# Patient Record
Sex: Female | Born: 1970
Health system: Southern US, Community
[De-identification: ages and names within clinical notes are randomized; demographics above are authoritative.]

## PROBLEM LIST (undated history)

## (undated) DIAGNOSIS — K219 Gastro-esophageal reflux disease without esophagitis: Secondary | ICD-10-CM

## (undated) DIAGNOSIS — E119 Type 2 diabetes mellitus without complications: Secondary | ICD-10-CM

## (undated) DIAGNOSIS — D649 Anemia, unspecified: Secondary | ICD-10-CM

## (undated) DIAGNOSIS — R569 Unspecified convulsions: Secondary | ICD-10-CM

## (undated) DIAGNOSIS — I10 Essential (primary) hypertension: Secondary | ICD-10-CM

## (undated) DIAGNOSIS — E7800 Pure hypercholesterolemia, unspecified: Secondary | ICD-10-CM

## (undated) DIAGNOSIS — R945 Abnormal results of liver function studies: Secondary | ICD-10-CM

## (undated) DIAGNOSIS — T7840XA Allergy, unspecified, initial encounter: Secondary | ICD-10-CM

## (undated) DIAGNOSIS — R7989 Other specified abnormal findings of blood chemistry: Secondary | ICD-10-CM

## (undated) DIAGNOSIS — F32A Depression, unspecified: Secondary | ICD-10-CM

## (undated) DIAGNOSIS — M3500 Sicca syndrome, unspecified: Secondary | ICD-10-CM

## (undated) DIAGNOSIS — F329 Major depressive disorder, single episode, unspecified: Secondary | ICD-10-CM

## (undated) DIAGNOSIS — M797 Fibromyalgia: Secondary | ICD-10-CM

## (undated) DIAGNOSIS — F419 Anxiety disorder, unspecified: Secondary | ICD-10-CM

## (undated) HISTORY — PX: COLONOSCOPY: SHX174

## (undated) HISTORY — DX: Other specified abnormal findings of blood chemistry: R79.89

## (undated) HISTORY — DX: Hypomagnesemia: E83.42

## (undated) HISTORY — PX: OTHER SURGICAL HISTORY: SHX169

## (undated) HISTORY — DX: Abnormal results of liver function studies: R94.5

## (undated) HISTORY — PX: TONSILLECTOMY: SUR1361

## (undated) HISTORY — PX: COLON SURGERY: SHX602

## (undated) HISTORY — DX: Anxiety disorder, unspecified: F41.9

## (undated) HISTORY — DX: Allergy, unspecified, initial encounter: T78.40XA

## (undated) HISTORY — DX: Anemia, unspecified: D64.9

## (undated) HISTORY — PX: ESSURE TUBAL LIGATION: SUR464

## (undated) HISTORY — PX: WISDOM TOOTH EXTRACTION: SHX21

## (undated) HISTORY — PX: UPPER GASTROINTESTINAL ENDOSCOPY: SHX188

## (undated) HISTORY — DX: Type 2 diabetes mellitus without complications: E11.9

## (undated) HISTORY — DX: Morbid (severe) obesity due to excess calories: E66.01

## (undated) HISTORY — DX: Gastro-esophageal reflux disease without esophagitis: K21.9

## (undated) HISTORY — DX: Unspecified convulsions: R56.9

---

## 1998-01-07 ENCOUNTER — Ambulatory Visit (HOSPITAL_COMMUNITY): Admission: RE | Admit: 1998-01-07 | Discharge: 1998-01-07 | Payer: Self-pay | Admitting: Obstetrics and Gynecology

## 1998-03-08 ENCOUNTER — Other Ambulatory Visit: Admission: RE | Admit: 1998-03-08 | Discharge: 1998-03-08 | Payer: Self-pay | Admitting: Obstetrics and Gynecology

## 1998-03-22 ENCOUNTER — Other Ambulatory Visit: Admission: RE | Admit: 1998-03-22 | Discharge: 1998-03-22 | Payer: Self-pay | Admitting: Family Medicine

## 1998-03-29 ENCOUNTER — Other Ambulatory Visit: Admission: RE | Admit: 1998-03-29 | Discharge: 1998-03-29 | Payer: Self-pay | Admitting: Obstetrics and Gynecology

## 1998-04-01 ENCOUNTER — Other Ambulatory Visit: Admission: RE | Admit: 1998-04-01 | Discharge: 1998-04-01 | Payer: Self-pay | Admitting: Obstetrics and Gynecology

## 1998-04-11 ENCOUNTER — Other Ambulatory Visit: Admission: RE | Admit: 1998-04-11 | Discharge: 1998-04-11 | Payer: Self-pay | Admitting: Obstetrics and Gynecology

## 1998-05-02 ENCOUNTER — Other Ambulatory Visit: Admission: RE | Admit: 1998-05-02 | Discharge: 1998-05-02 | Payer: Self-pay | Admitting: Obstetrics and Gynecology

## 1998-05-15 ENCOUNTER — Ambulatory Visit (HOSPITAL_COMMUNITY): Admission: RE | Admit: 1998-05-15 | Discharge: 1998-05-15 | Payer: Self-pay | Admitting: Obstetrics and Gynecology

## 1998-07-31 ENCOUNTER — Ambulatory Visit (HOSPITAL_COMMUNITY): Admission: RE | Admit: 1998-07-31 | Discharge: 1998-07-31 | Payer: Self-pay | Admitting: Obstetrics and Gynecology

## 1998-10-16 ENCOUNTER — Inpatient Hospital Stay (HOSPITAL_COMMUNITY): Admission: AD | Admit: 1998-10-16 | Discharge: 1998-10-19 | Payer: Self-pay | Admitting: Obstetrics and Gynecology

## 1998-12-02 ENCOUNTER — Other Ambulatory Visit: Admission: RE | Admit: 1998-12-02 | Discharge: 1998-12-02 | Payer: Self-pay | Admitting: Obstetrics and Gynecology

## 1999-07-01 ENCOUNTER — Other Ambulatory Visit: Admission: RE | Admit: 1999-07-01 | Discharge: 1999-07-01 | Payer: Self-pay | Admitting: Obstetrics and Gynecology

## 2000-01-21 ENCOUNTER — Ambulatory Visit (HOSPITAL_COMMUNITY): Admission: RE | Admit: 2000-01-21 | Discharge: 2000-01-21 | Payer: Self-pay | Admitting: Obstetrics and Gynecology

## 2000-04-06 ENCOUNTER — Inpatient Hospital Stay (HOSPITAL_COMMUNITY): Admission: AD | Admit: 2000-04-06 | Discharge: 2000-04-08 | Payer: Self-pay | Admitting: Obstetrics and Gynecology

## 2000-05-17 ENCOUNTER — Other Ambulatory Visit: Admission: RE | Admit: 2000-05-17 | Discharge: 2000-05-17 | Payer: Self-pay | Admitting: Obstetrics and Gynecology

## 2001-06-24 ENCOUNTER — Other Ambulatory Visit: Admission: RE | Admit: 2001-06-24 | Discharge: 2001-06-24 | Payer: Self-pay | Admitting: Obstetrics and Gynecology

## 2002-07-17 ENCOUNTER — Other Ambulatory Visit: Admission: RE | Admit: 2002-07-17 | Discharge: 2002-07-17 | Payer: Self-pay | Admitting: Obstetrics and Gynecology

## 2003-08-06 ENCOUNTER — Other Ambulatory Visit: Admission: RE | Admit: 2003-08-06 | Discharge: 2003-08-06 | Payer: Self-pay | Admitting: Obstetrics and Gynecology

## 2004-08-27 ENCOUNTER — Other Ambulatory Visit: Admission: RE | Admit: 2004-08-27 | Discharge: 2004-08-27 | Payer: Self-pay | Admitting: Obstetrics and Gynecology

## 2005-08-20 ENCOUNTER — Other Ambulatory Visit: Admission: RE | Admit: 2005-08-20 | Discharge: 2005-08-20 | Payer: Self-pay | Admitting: Obstetrics and Gynecology

## 2005-11-09 ENCOUNTER — Ambulatory Visit: Payer: Self-pay | Admitting: Internal Medicine

## 2008-01-08 ENCOUNTER — Inpatient Hospital Stay (HOSPITAL_COMMUNITY): Admission: EM | Admit: 2008-01-08 | Discharge: 2008-01-10 | Payer: Self-pay | Admitting: *Deleted

## 2008-01-09 ENCOUNTER — Ambulatory Visit: Payer: Self-pay | Admitting: *Deleted

## 2008-12-22 ENCOUNTER — Emergency Department (HOSPITAL_BASED_OUTPATIENT_CLINIC_OR_DEPARTMENT_OTHER): Admission: EM | Admit: 2008-12-22 | Discharge: 2008-12-23 | Payer: Self-pay | Admitting: Emergency Medicine

## 2008-12-23 ENCOUNTER — Ambulatory Visit: Payer: Self-pay | Admitting: Diagnostic Radiology

## 2008-12-23 ENCOUNTER — Encounter (INDEPENDENT_AMBULATORY_CARE_PROVIDER_SITE_OTHER): Payer: Self-pay | Admitting: *Deleted

## 2008-12-25 ENCOUNTER — Encounter: Payer: Self-pay | Admitting: Internal Medicine

## 2009-06-21 ENCOUNTER — Telehealth: Payer: Self-pay | Admitting: Internal Medicine

## 2009-06-24 ENCOUNTER — Ambulatory Visit: Payer: Self-pay | Admitting: Internal Medicine

## 2009-06-24 DIAGNOSIS — R112 Nausea with vomiting, unspecified: Secondary | ICD-10-CM | POA: Insufficient documentation

## 2009-06-24 DIAGNOSIS — K219 Gastro-esophageal reflux disease without esophagitis: Secondary | ICD-10-CM | POA: Insufficient documentation

## 2009-06-24 LAB — CONVERTED CEMR LAB
AST: 20 units/L (ref 0–37)
Albumin: 3.7 g/dL (ref 3.5–5.2)
Amylase: 57 units/L (ref 27–131)
BUN: 11 mg/dL (ref 6–23)
Basophils Absolute: 0 10*3/uL (ref 0.0–0.1)
CO2: 29 meq/L (ref 19–32)
Calcium: 8.8 mg/dL (ref 8.4–10.5)
Creatinine, Ser: 0.5 mg/dL (ref 0.4–1.2)
Eosinophils Absolute: 0 10*3/uL (ref 0.0–0.7)
Glucose, Bld: 99 mg/dL (ref 70–99)
Hemoglobin: 13.3 g/dL (ref 12.0–15.0)
Lipase: 13 units/L (ref 0–75)
Lymphocytes Relative: 23 % (ref 12.0–46.0)
Lymphs Abs: 1.9 10*3/uL (ref 0.7–4.0)
Monocytes Relative: 5.7 % (ref 3.0–12.0)
Platelets: 240 10*3/uL (ref 150.0–400.0)
Potassium: 3.5 meq/L (ref 3.5–5.1)
TSH: 1.45 microintl units/mL (ref 0.35–5.50)
Total Bilirubin: 0.9 mg/dL (ref 0.3–1.2)
WBC: 8.4 10*3/uL (ref 4.5–10.5)

## 2009-06-26 ENCOUNTER — Telehealth: Payer: Self-pay | Admitting: Internal Medicine

## 2009-06-27 ENCOUNTER — Ambulatory Visit: Payer: Self-pay | Admitting: Internal Medicine

## 2009-06-28 LAB — CONVERTED CEMR LAB: UREASE: NEGATIVE

## 2009-07-01 ENCOUNTER — Telehealth: Payer: Self-pay | Admitting: Internal Medicine

## 2009-07-10 ENCOUNTER — Telehealth: Payer: Self-pay | Admitting: Internal Medicine

## 2009-07-10 ENCOUNTER — Encounter: Payer: Self-pay | Admitting: Internal Medicine

## 2009-07-11 ENCOUNTER — Encounter: Payer: Self-pay | Admitting: Internal Medicine

## 2009-07-11 ENCOUNTER — Telehealth (INDEPENDENT_AMBULATORY_CARE_PROVIDER_SITE_OTHER): Payer: Self-pay | Admitting: *Deleted

## 2009-07-17 ENCOUNTER — Encounter: Payer: Self-pay | Admitting: Internal Medicine

## 2009-07-18 ENCOUNTER — Ambulatory Visit (HOSPITAL_COMMUNITY): Admission: RE | Admit: 2009-07-18 | Discharge: 2009-07-18 | Payer: Self-pay | Admitting: Internal Medicine

## 2010-07-09 ENCOUNTER — Encounter: Payer: Self-pay | Admitting: Internal Medicine

## 2010-07-10 ENCOUNTER — Encounter: Payer: Self-pay | Admitting: Internal Medicine

## 2010-10-24 ENCOUNTER — Ambulatory Visit (HOSPITAL_COMMUNITY): Admission: RE | Admit: 2010-10-24 | Payer: Self-pay | Source: Home / Self Care | Admitting: Obstetrics and Gynecology

## 2010-11-23 ENCOUNTER — Encounter: Payer: Self-pay | Admitting: Obstetrics and Gynecology

## 2010-11-24 ENCOUNTER — Encounter: Payer: Self-pay | Admitting: Internal Medicine

## 2010-12-02 NOTE — Medication Information (Signed)
Summary: Pantoprazole DENIED/[place] [employer]  Pantoprazole DENIED/[place] [facility]   Imported By: Sherian Rein 08/04/2010 08:51:40  _____________________________________________________________________  External Attachment:    Type:   Image     Comment:   External Document

## 2010-12-02 NOTE — Medication Information (Signed)
Summary: Change in meds/[facility]  Change in meds/[facility]   Imported By: Lester [place] 07/15/2010 10:13:37  _____________________________________________________________________  External Attachment:    Type:   Image     Comment:   External Document

## 2010-12-02 NOTE — Miscellaneous (Signed)
Summary: Change Rx. to Nexium  Clinical Lists Changes  Medications: Removed medication of PANTOPRAZOLE SODIUM 40 MG TBEC (PANTOPRAZOLE SODIUM) Take 1 p.o. one half hour before breakfast and one half hr. before supper Added new medication of NEXIUM 40 MG CPDR (ESOMEPRAZOLE MAGNESIUM) 1 by mouth two times a day - Signed Rx of NEXIUM 40 MG CPDR (ESOMEPRAZOLE MAGNESIUM) 1 by mouth two times a day;  #60 x 11;  Signed;  Entered by: Milford Cage NCMA;  Authorized by: Hilarie Fredrickson MD;  Method used: Electronically to [facility] [address], [place], [place], [place]  [address], Ph: [phone] or [phone], Fax: [phone]    Prescriptions: NEXIUM 40 MG CPDR (ESOMEPRAZOLE MAGNESIUM) 1 by mouth two times a day  #60 x 11   Entered by:   Milford Cage NCMA   Authorized by:   Hilarie Fredrickson MD   Signed by:   Milford Cage NCMA on 07/09/2010   Method used:   Electronically to        [facility] [address] (retail)       [address]       [place], [place]  [address]       Ph: [phone] or [phone]       Fax: [phone]   RxID:   [id number]

## 2011-02-17 LAB — DIFFERENTIAL
Eosinophils Absolute: 0 10*3/uL (ref 0.0–0.7)
Lymphs Abs: 2.1 10*3/uL (ref 0.7–4.0)
Monocytes Relative: 3 % (ref 3–12)
Neutro Abs: 8.4 10*3/uL — ABNORMAL HIGH (ref 1.7–7.7)
Neutrophils Relative %: 77 % (ref 43–77)

## 2011-02-17 LAB — COMPREHENSIVE METABOLIC PANEL
ALT: 31 U/L (ref 0–35)
Calcium: 9.3 mg/dL (ref 8.4–10.5)
Glucose, Bld: 106 mg/dL — ABNORMAL HIGH (ref 70–99)
Sodium: 139 mEq/L (ref 135–145)
Total Protein: 8.2 g/dL (ref 6.0–8.3)

## 2011-02-17 LAB — URINALYSIS, ROUTINE W REFLEX MICROSCOPIC
Bilirubin Urine: NEGATIVE
Glucose, UA: NEGATIVE mg/dL
Hgb urine dipstick: NEGATIVE
Ketones, ur: 15 mg/dL — AB
Protein, ur: 100 mg/dL — AB
pH: 6 (ref 5.0–8.0)

## 2011-02-17 LAB — CBC
Hemoglobin: 14.1 g/dL (ref 12.0–15.0)
MCHC: 34.1 g/dL (ref 30.0–36.0)
RDW: 12.1 % (ref 11.5–15.5)

## 2011-03-20 NOTE — Discharge Summary (Signed)
NAMECATHERYN, [name] NO.:  [id number]   MEDICAL RECORD NO.:  [id number]          PATIENT TYPE:  IPS   LOCATION:  0307                          FACILITY:  [facility]   PHYSICIAN:  Jasmine Pang, M.D. DATE OF BIRTH:  Apr 21, 1971   DATE OF ADMISSION:  01/08/2008  DATE OF DISCHARGE:  01/10/2008                               DISCHARGE SUMMARY   IDENTIFICATION:  This is a 40 year old married female from [place],  [place] who was admitted on a voluntary basis on January 08, 2008.   HISTORY OF PRESENT ILLNESS:  The patient suffers from depression,  anxiety, and suicidal ideation.  She was referred by her outpatient  psychiatrist, Dr. Marijo File for inpatient stabilization.  She has a history  of depression and has been severely depressed for the past 6 weeks.  She  reports staying in bed, losing weight, isolating, having uncontrollable  crying, generalized anxiety, and irritability.  She reports suicidal  ideation, but does not want to act on thoughs because of her children.  She is unable to function daily activities.  She cannot identify a  particular stressor, but says her mother died 2 years ago and her 7-year-  old has mental health problems.   PAST PSYCHIATRIC HISTORY:  This is the first [facility] admission for the  patient.  She sees Dr. Franchot Erichsen on an outpatient basis in [facility]  Her therapist is Nicole Cella.   ALCOHOL AND DRUG HISTORY:  None.   MEDICAL PROBLEMS:  Polycystic ovaries and ovarian syndrome.   MEDICATIONS:  1. Lipitor 40 mg daily.  2. Glucophage 1000 mg daily.  3. Lexapro 20 mg daily.  4. Abilify 7.5 mg daily.  5. Klonopin 1 mg p.o. b.i.d.  6. Topamax 75 mg daily.   DRUG ALLERGIES:  Penicillin and sulfa drugs.   PHYSICAL FINDINGS:  There were no acute physical or medical problems  noted.   ADMISSION LABORATORIES:  CBC was within normal limits except for an  elevated glucose of 109 and a total bilirubin of 1.5.  AST was 52 and  ALT was  50.   HOSPITAL COURSE:  Upon admission, the patient was continued on her  Lipitor 40 mg daily, Glucophage 1000 mg daily, Topamax 75 mg daily,  Lexapro 20 mg daily, Abilify 7.5 mg daily, and clonazepam 1 mg p.o.  b.i.d., and the patient was also started on Ambien 10 mg p.o. q.h.s.  p.r.n., insomnia.  Initially in session with me, the patient was  tearful, but cooperative.  She was somewhat reserved, but was able to  participate appropriately in unit therapeutic groups and activities.  Therapeutic issues revolved around her feelings of increasing depression  over the past month.  Her Abilify was increased to 15 mg p.o. q. day and  Lexapro was increased to 30 mg q. day.  She was also restarted on  Concerta, which had been an outpatient medication, Concerta 36 mg p.o.  q. day.  The patient improved quickly.  Her sleep was good.  Appetite  was good.  On January 10, 2008, mood was less depressed  and anxious.  Affect consistent with mood.  There was no suicidal or homicidal  ideation.  No thoughts of self-injurious behavior.  No auditory or  visual hallucinations.  No paranoia or delusions.  Thoughts were logical  and goal-directed.  Thought content, no predominant theme.  Cognitive  exam was grossly back to baseline.  The patient felt ready for discharge  and stated she felt uncomfortable in the hospital.  She felt she could  be safe as an outpatient and I agreed.   DISCHARGE DIAGNOSES:  Axis I:  Major depressive disorder, recurrent,  severe without psychosis.  Axis II:  None.  Axis III:  Polycystic ovarian syndrome.  Axis IV:  Moderate (other psychosocial problems, burden of psychiatric  illness).  Axis V:  Global assessment of functioning was 50 upon discharge.  GAF  was 35 upon admission.  GAF highest past year was 65.   DISCHARGE PLANS:  There was no specific activity level or dietary  restrictions.   POSTHOSPITAL CARE PLANS:  The patient will see Dr. Franchot Erichsen, her  psychiatrist on  January 18, 2008, at 11 o'clock a.m.  She will see Dr.  Nicole Cella, her therapist on January 19, 2008, at 10:30 a.m.   DISCHARGE MEDICATIONS:  1. Lexapro 30 mg daily.  2. Abilify 15 mg daily.  3. Klonopin 1 mg p.o. b.i.d.  4. Topamax 75 mg daily.  5. Concerta was increased to 27 mg daily because she felt the 36 mg      was too high.  6. Lipitor 40 mg daily.  7. Glucophage 1000 mg daily.      Jasmine Pang, M.D.  Electronically Signed     [facility]/MEDQ  D:  01/28/2008  T:  01/28/2008  Job:  [id number]

## 2011-07-27 LAB — DRUGS OF ABUSE SCREEN W/O ALC, ROUTINE URINE
Amphetamine Screen, Ur: NEGATIVE
Benzodiazepines.: NEGATIVE
Creatinine,U: 247.6
Marijuana Metabolite: NEGATIVE
Propoxyphene: NEGATIVE

## 2011-07-27 LAB — URINALYSIS, ROUTINE W REFLEX MICROSCOPIC
Hgb urine dipstick: NEGATIVE
Leukocytes, UA: NEGATIVE
Nitrite: NEGATIVE
Specific Gravity, Urine: 1.022
Urobilinogen, UA: 1

## 2011-07-27 LAB — COMPREHENSIVE METABOLIC PANEL
AST: 52 — ABNORMAL HIGH
Albumin: 3.8
Calcium: 9.5
Creatinine, Ser: 0.83
GFR calc Af Amer: >60
GFR calc non Af Amer: >60

## 2011-07-27 LAB — CBC
MCHC: 33.6
MCV: 79.2
Platelets: 259

## 2011-07-27 LAB — URINE MICROSCOPIC-ADD ON

## 2011-12-05 ENCOUNTER — Encounter (HOSPITAL_BASED_OUTPATIENT_CLINIC_OR_DEPARTMENT_OTHER): Payer: Self-pay | Admitting: Emergency Medicine

## 2011-12-05 ENCOUNTER — Emergency Department (HOSPITAL_BASED_OUTPATIENT_CLINIC_OR_DEPARTMENT_OTHER)
Admission: EM | Admit: 2011-12-05 | Discharge: 2011-12-05 | Disposition: A | Discharge status: Home / Self Care | Payer: Medicare HMO | Attending: Emergency Medicine | Admitting: Emergency Medicine

## 2011-12-05 DIAGNOSIS — R112 Nausea with vomiting, unspecified: Secondary | ICD-10-CM | POA: Insufficient documentation

## 2011-12-05 DIAGNOSIS — A499 Bacterial infection, unspecified: Secondary | ICD-10-CM | POA: Insufficient documentation

## 2011-12-05 DIAGNOSIS — E7800 Pure hypercholesterolemia, unspecified: Secondary | ICD-10-CM | POA: Insufficient documentation

## 2011-12-05 DIAGNOSIS — N390 Urinary tract infection, site not specified: Secondary | ICD-10-CM

## 2011-12-05 DIAGNOSIS — Z79899 Other long term (current) drug therapy: Secondary | ICD-10-CM | POA: Insufficient documentation

## 2011-12-05 DIAGNOSIS — F3289 Other specified depressive episodes: Secondary | ICD-10-CM | POA: Insufficient documentation

## 2011-12-05 DIAGNOSIS — I10 Essential (primary) hypertension: Secondary | ICD-10-CM | POA: Insufficient documentation

## 2011-12-05 DIAGNOSIS — B9689 Other specified bacterial agents as the cause of diseases classified elsewhere: Secondary | ICD-10-CM | POA: Insufficient documentation

## 2011-12-05 DIAGNOSIS — N920 Excessive and frequent menstruation with regular cycle: Secondary | ICD-10-CM | POA: Insufficient documentation

## 2011-12-05 DIAGNOSIS — N760 Acute vaginitis: Secondary | ICD-10-CM | POA: Insufficient documentation

## 2011-12-05 DIAGNOSIS — F329 Major depressive disorder, single episode, unspecified: Secondary | ICD-10-CM | POA: Insufficient documentation

## 2011-12-05 HISTORY — DX: Pure hypercholesterolemia, unspecified: E78.00

## 2011-12-05 HISTORY — DX: Essential (primary) hypertension: I10

## 2011-12-05 HISTORY — DX: Major depressive disorder, single episode, unspecified: F32.9

## 2011-12-05 HISTORY — DX: Depression, unspecified: F32.A

## 2011-12-05 LAB — CBC
HCT: 43.9 % (ref 36.0–46.0)
MCH: 29.1 pg (ref 26.0–34.0)
MCV: 85.7 fL (ref 78.0–100.0)
Platelets: 219 10*3/uL (ref 150–400)
RDW: 13.2 % (ref 11.5–15.5)

## 2011-12-05 LAB — WET PREP, GENITAL
Trich, Wet Prep: NONE SEEN
Yeast Wet Prep HPF POC: NONE SEEN

## 2011-12-05 LAB — URINALYSIS, MICROSCOPIC ONLY
Ketones, ur: 15 mg/dL — AB
Nitrite: POSITIVE — AB
Protein, ur: 30 mg/dL — AB
Specific Gravity, Urine: 1.026 (ref 1.005–1.030)
Urobilinogen, UA: 1 mg/dL (ref 0.0–1.0)

## 2011-12-05 LAB — PREGNANCY, URINE: Preg Test, Ur: NEGATIVE

## 2011-12-05 MED ORDER — MORPHINE SULFATE 4 MG/ML IJ SOLN
4.0000 mg | Freq: Once | INTRAMUSCULAR | Status: AC
  Administered 2011-12-05: 4 mg via INTRAVENOUS
  Filled 2011-12-05: qty 1

## 2011-12-05 MED ORDER — ONDANSETRON 4 MG PO TBDP: 4.0000 mg | ORAL_TABLET | Freq: Three times a day (TID) | ORAL | Status: AC | PRN

## 2011-12-05 MED ORDER — KETOROLAC TROMETHAMINE 30 MG/ML IJ SOLN
30.0000 mg | Freq: Once | INTRAMUSCULAR | Status: AC
  Administered 2011-12-05: 30 mg via INTRAVENOUS
  Filled 2011-12-05: qty 1

## 2011-12-05 MED ORDER — NITROFURANTOIN MONOHYD MACRO 100 MG PO CAPS: 100.0000 mg | ORAL_CAPSULE | Freq: Two times a day (BID) | ORAL | Status: AC

## 2011-12-05 MED ORDER — HYDROCODONE-ACETAMINOPHEN 5-500 MG PO TABS: 1.0000 | ORAL_TABLET | Freq: Four times a day (QID) | ORAL | Status: AC | PRN

## 2011-12-05 MED ORDER — SODIUM CHLORIDE 0.9 % IV SOLN
Freq: Once | INTRAVENOUS | Status: AC
  Administered 2011-12-05: 999 mL via INTRAVENOUS

## 2011-12-05 MED ORDER — METRONIDAZOLE 500 MG PO TABS: 500.0000 mg | ORAL_TABLET | Freq: Two times a day (BID) | ORAL | Status: AC

## 2011-12-05 MED ORDER — ONDANSETRON HCL 4 MG/2ML IJ SOLN
4.0000 mg | Freq: Once | INTRAMUSCULAR | Status: AC
  Administered 2011-12-05: 4 mg via INTRAVENOUS

## 2011-12-05 MED ORDER — ONDANSETRON HCL 4 MG/2ML IJ SOLN
INTRAMUSCULAR | Status: AC
  Administered 2011-12-05: 4 mg via INTRAVENOUS
  Filled 2011-12-05: qty 2

## 2011-12-05 NOTE — ED Notes (Signed)
Pt started menstrual cycle yesterday.  This morning she started having Lower pelvis cramping and lower back pain this am with N/V.  Pt has been having irregular periods but last one a month ago.  No diarrhea.  Last BM 2 days ago.  No vaginal burning or itching.  No dysuria.

## 2011-12-05 NOTE — ED Provider Notes (Signed)
History     CSN: [id number]  Arrival date & time 12/05/11  1134   First MD Initiated Contact with Patient 12/05/11 1216      Chief Complaint  Patient presents with  . Vaginal Bleeding  . Nausea  . Emesis    (Consider location/radiation/quality/duration/timing/severity/associated sxs/prior treatment) HPI Comments: Pt states that she has a history of irregular periods and she had one last month and again this month:pt states that she doesn't normally get much cramping:pt states that she is using 1 pad per hour this morning  Patient is a 41 y.o. female presenting with vaginal bleeding. The history is provided by the patient. No language interpreter was used.  Vaginal Bleeding This is a new problem. The current episode started yesterday. The problem occurs constantly. The problem has been unchanged. Associated symptoms include abdominal pain, nausea and vomiting. Pertinent negatives include no fever or urinary symptoms. The symptoms are aggravated by nothing. She has tried nothing for the symptoms.    Past Medical History  Diagnosis Date  . Hypertension   . Depression   . Hypercholesteremia     History reviewed. No pertinent past surgical history.  History reviewed. No pertinent family history.  History  Substance Use Topics  . Smoking status: Not on file  . Smokeless tobacco: Not on file  . Alcohol Use:     OB History    Grav Para Term Preterm Abortions TAB SAB Ect Mult Living   2 2 2              Review of Systems  Constitutional: Negative for fever.  Gastrointestinal: Positive for nausea, vomiting and abdominal pain.  Genitourinary: Positive for vaginal bleeding.  All other systems reviewed and are negative.    Allergies  Penicillins and Sulfonamide derivatives  Home Medications   Current Outpatient Rx  Name Route Sig Dispense Refill  . ARIPIPRAZOLE 10 MG PO TABS Oral Take 10 mg by mouth daily.    Marland Kitchen ESCITALOPRAM OXALATE 10 MG PO TABS Oral Take 10 mg by  mouth daily.    . FENOFIBRATE 145 MG PO TABS Oral Take 145 mg by mouth daily.    Marland Kitchen LAMOTRIGINE 100 MG PO TABS Oral Take 100 mg by mouth daily.    Marland Kitchen LISINOPRIL 10 MG PO TABS Oral Take 10 mg by mouth daily.    Marland Kitchen ROSUVASTATIN CALCIUM 10 MG PO TABS Oral Take 10 mg by mouth daily.      BP 107/48  Pulse 75  Temp 97.5 F (36.4 C)  Resp 22  Ht 5\' 7"  (1.702 m)  SpO2 97%  LMP 12/04/2011  Physical Exam  Nursing note and vitals reviewed. Constitutional: She is oriented to person, place, and time. She appears well-developed and well-nourished.  HENT:  Head: Normocephalic and atraumatic.  Right Ear: External ear normal.  Left Ear: External ear normal.  Eyes: EOM are normal.  Neck: Neck supple.  Cardiovascular: Normal rate and regular rhythm.   Pulmonary/Chest: Effort normal and breath sounds normal.  Abdominal: Soft. Bowel sounds are normal. There is no tenderness.  Genitourinary: Cervix exhibits no motion tenderness. There is bleeding around the vagina.  Musculoskeletal: Normal range of motion.  Neurological: She is alert and oriented to person, place, and time.  Skin: Skin is warm and dry.  Psychiatric: She has a normal mood and affect.    ED Course  Procedures (including critical care time)  Labs Reviewed  URINALYSIS, WITH MICROSCOPIC - Abnormal; Notable for the following:  Color, Urine BROWN (*) BIOCHEMICALS MAY BE AFFECTED BY COLOR   APPearance CLOUDY (*)    Hgb urine dipstick LARGE (*)    Bilirubin Urine MODERATE (*)    Ketones, ur 15 (*)    Protein, ur 30 (*)    Nitrite POSITIVE (*)    Leukocytes, UA SMALL (*)    Bacteria, UA MANY (*)    All other components within normal limits  WET PREP, GENITAL - Abnormal; Notable for the following:    Clue Cells Wet Prep HPF POC FEW (*)    WBC, Wet Prep HPF POC FEW (*)    All other components within normal limits  CBC - Abnormal; Notable for the following:    WBC 17.2 (*)    RBC 5.12 (*)    All other components within normal  limits  PREGNANCY, URINE  GC/CHLAMYDIA PROBE AMP, GENITAL   No results found.   1. UTI (lower urinary tract infection)   2. BV (bacterial vaginosis)   3. Menorrhagia       MDM  Pt has a benign abdominal exam:pt feeling better after pain medication:pt is tolerating po here without any problem:pt hgb is normal at this time        Teressa Lower, NP 12/05/11 1530

## 2011-12-06 NOTE — ED Provider Notes (Signed)
Medical screening examination/treatment/procedure(s) were performed by non-physician practitioner and as supervising physician I was immediately available for consultation/collaboration.  Zvi Duplantis, MD 12/06/11 0754 

## 2011-12-07 LAB — GC/CHLAMYDIA PROBE AMP, GENITAL
Chlamydia, DNA Probe: NEGATIVE
GC Probe Amp, Genital: NEGATIVE

## 2012-02-19 ENCOUNTER — Other Ambulatory Visit (HOSPITAL_COMMUNITY): Payer: Self-pay | Admitting: Obstetrics and Gynecology

## 2012-02-19 DIAGNOSIS — N971 Female infertility of tubal origin: Secondary | ICD-10-CM

## 2012-03-16 ENCOUNTER — Ambulatory Visit (HOSPITAL_COMMUNITY): Payer: Medicare HMO

## 2013-01-29 ENCOUNTER — Encounter (HOSPITAL_BASED_OUTPATIENT_CLINIC_OR_DEPARTMENT_OTHER): Payer: Self-pay | Admitting: *Deleted

## 2013-01-29 ENCOUNTER — Emergency Department (HOSPITAL_BASED_OUTPATIENT_CLINIC_OR_DEPARTMENT_OTHER)
Admission: EM | Admit: 2013-01-29 | Discharge: 2013-01-29 | Disposition: A | Discharge status: Home / Self Care | Payer: Medicare HMO | Attending: Emergency Medicine | Admitting: Emergency Medicine

## 2013-01-29 ENCOUNTER — Emergency Department (HOSPITAL_BASED_OUTPATIENT_CLINIC_OR_DEPARTMENT_OTHER): Payer: Medicare HMO

## 2013-01-29 DIAGNOSIS — E669 Obesity, unspecified: Secondary | ICD-10-CM | POA: Insufficient documentation

## 2013-01-29 DIAGNOSIS — I10 Essential (primary) hypertension: Secondary | ICD-10-CM | POA: Insufficient documentation

## 2013-01-29 DIAGNOSIS — F329 Major depressive disorder, single episode, unspecified: Secondary | ICD-10-CM | POA: Insufficient documentation

## 2013-01-29 DIAGNOSIS — F3289 Other specified depressive episodes: Secondary | ICD-10-CM | POA: Insufficient documentation

## 2013-01-29 DIAGNOSIS — Z87891 Personal history of nicotine dependence: Secondary | ICD-10-CM | POA: Insufficient documentation

## 2013-01-29 DIAGNOSIS — Z79899 Other long term (current) drug therapy: Secondary | ICD-10-CM | POA: Insufficient documentation

## 2013-01-29 DIAGNOSIS — M3500 Sicca syndrome, unspecified: Secondary | ICD-10-CM

## 2013-01-29 DIAGNOSIS — Z8739 Personal history of other diseases of the musculoskeletal system and connective tissue: Secondary | ICD-10-CM

## 2013-01-29 DIAGNOSIS — R079 Chest pain, unspecified: Secondary | ICD-10-CM | POA: Insufficient documentation

## 2013-01-29 DIAGNOSIS — M940 Chondrocostal junction syndrome [Tietze]: Secondary | ICD-10-CM

## 2013-01-29 DIAGNOSIS — E7800 Pure hypercholesterolemia, unspecified: Secondary | ICD-10-CM | POA: Insufficient documentation

## 2013-01-29 HISTORY — DX: Sjogren syndrome, unspecified: M35.00

## 2013-01-29 HISTORY — DX: Fibromyalgia: M79.7

## 2013-01-29 LAB — CBC WITH DIFFERENTIAL/PLATELET
Lymphocytes Relative: 31 % (ref 12–46)
Lymphs Abs: 3.2 10*3/uL (ref 0.7–4.0)
Neutrophils Relative %: 58 % (ref 43–77)
Platelets: 246 10*3/uL (ref 150–400)
RBC: 4.68 MIL/uL (ref 3.87–5.11)
WBC: 10.2 10*3/uL (ref 4.0–10.5)

## 2013-01-29 LAB — BASIC METABOLIC PANEL
CO2: 27 mEq/L (ref 19–32)
GFR calc non Af Amer: >90 mL/min (ref >90)
Glucose, Bld: 113 mg/dL — ABNORMAL HIGH (ref 70–99)
Potassium: 3.8 mEq/L (ref 3.5–5.1)
Sodium: 137 mEq/L (ref 135–145)

## 2013-01-29 MED ORDER — PREDNISONE 20 MG PO TABS: 60.0000 mg | ORAL_TABLET | Freq: Every day | ORAL | Status: DC

## 2013-01-29 MED ORDER — HYDROCODONE-ACETAMINOPHEN 5-325 MG PO TABS
1.0000 | ORAL_TABLET | Freq: Once | ORAL | Status: AC
  Administered 2013-01-29: 1 via ORAL
  Filled 2013-01-29: qty 1

## 2013-01-29 NOTE — ED Notes (Signed)
Patient transported to X-ray 

## 2013-01-29 NOTE — ED Notes (Signed)
C/o pain in right upper chest x 4 days ago- "hurts when i take a deep breath"- has been taking ibuprofen, naprosyn and percocet without relief

## 2013-01-29 NOTE — ED Notes (Signed)
Pt reports right side chest pain with cough- "hurts when i breathe in"

## 2013-01-29 NOTE — ED Provider Notes (Signed)
History     CSN: [id number]  Arrival date & time 01/29/13  1337   First MD Initiated Contact with Patient 01/29/13 1347      Chief Complaint  Patient presents with  . Chest Pain    (Consider location/radiation/quality/duration/timing/severity/associated sxs/prior treatment) HPI Comments: Patient is a 42 y/o F with PMHx of Fibromyalgia, HTN, Hypercholesterolemia, Sjoegren Syndrome, depression presenting to the ED with right side chest pain x 4 days. Patient reported that pain is localized to the right side of the chest with radiation to right upper back, right posterior shoulder, and right mid-axillary region. Patient described pain as constant dull, aching sensation. Patient reported that pain is worse with deep inhalation, but improves she when lays down on her right side. Patient reported taking Naproxen and Percocets - which are patient's at home medication - with no relief. Patient denied right breast pain, abdominal pain, shortness of breathe, difficulty breathing, arm pain, nausea, vomiting, diarrhea, fever, chills, leg swelling, leg pain, urinary symptoms.     Patient is a 42 y.o. female presenting with chest pain. The history is provided by the patient. No language interpreter was used.  Chest Pain Pain location:  R chest Pain quality: aching and dull   Pain quality: not burning, no pressure, not sharp and not shooting   Pain radiates to:  R shoulder and upper back Pain radiates to the back: no   Pain severity:  Moderate Onset quality:  Gradual Duration:  4 days Timing:  Constant Progression:  Unchanged Chronicity:  Recurrent Context: breathing and at rest   Context: not eating and not lifting   Relieved by:  Rest (Laying on right side.) Worsened by:  Deep breathing (activity) Ineffective treatments: Percocet, naproxen. Associated symptoms: no abdominal pain, no altered mental status, no back pain, no cough, no dizziness, no dysphagia, no fever, no headache, no lower  extremity edema, no nausea, no numbness, no palpitations, no shortness of breath and not vomiting   Risk factors: obesity   Risk factors: no birth control and not pregnant     Past Medical History  Diagnosis Date  . Hypertension   . Depression   . Hypercholesteremia   . Sjoegren syndrome   . Fibromyalgia     Past Surgical History  Procedure Laterality Date  . Wisdom tooth extraction    . Essure tubal ligation    . Tonsillectomy      No family history on file.  History  Substance Use Topics  . Smoking status: Former Games developer  . Smokeless tobacco: Never Used  . Alcohol Use: No    OB History   Grav Para Term Preterm Abortions TAB SAB Ect Mult Living   2 2 2              Review of Systems  Constitutional: Negative for fever and chills.  HENT: Negative for ear pain, trouble swallowing and neck stiffness.   Eyes: Negative for pain.  Respiratory: Negative for cough, chest tightness and shortness of breath.   Cardiovascular: Positive for chest pain. Negative for palpitations.  Gastrointestinal: Negative for nausea, vomiting, abdominal pain, diarrhea and constipation.  Musculoskeletal: Negative for back pain.  Neurological: Negative for dizziness, numbness and headaches.  Psychiatric/Behavioral: Negative for altered mental status.  All other systems reviewed and are negative.    Allergies  Penicillins and Sulfonamide derivatives  Home Medications   Current Outpatient Rx  Name  Route  Sig  Dispense  Refill  . buPROPion (WELLBUTRIN XL) 300 MG 24 hr  tablet   Oral   Take 300 mg by mouth daily.         . clonazePAM (KLONOPIN) 1 MG tablet   Oral   Take 1 mg by mouth 3 (three) times daily as needed for anxiety.         Marland Kitchen escitalopram (LEXAPRO) 10 MG tablet   Oral   Take 10 mg by mouth daily.         . fenofibrate (TRICOR) 145 MG tablet   Oral   Take 145 mg by mouth daily.         Marland Kitchen ibuprofen (ADVIL,MOTRIN) 600 MG tablet   Oral   Take 600 mg by mouth  every 6 (six) hours as needed for pain.         Marland Kitchen lamoTRIgine (LAMICTAL) 100 MG tablet   Oral   Take 100 mg by mouth daily.         . naproxen (NAPROSYN) 500 MG tablet   Oral   Take 500 mg by mouth 2 (two) times daily with a meal.         . oxyCODONE-acetaminophen (PERCOCET/ROXICET) 5-325 MG per tablet   Oral   Take 1 tablet by mouth every 4 (four) hours as needed for pain.         . rosuvastatin (CRESTOR) 10 MG tablet   Oral   Take 10 mg by mouth daily.         . ARIPiprazole (ABILIFY) 10 MG tablet   Oral   Take 10 mg by mouth daily.         Marland Kitchen lisinopril (PRINIVIL,ZESTRIL) 10 MG tablet   Oral   Take 10 mg by mouth daily.         . predniSONE (DELTASONE) 20 MG tablet   Oral   Take 3 tablets (60 mg total) by mouth daily. For 4 days.   12 tablet   0     BP 139/63  Pulse 81  Temp(Src) 98.3 F (36.8 C) (Oral)  Resp 20  Ht 5\' 7"  (1.702 m)  Wt 250 lb (113.399 kg)  BMI 39.15 kg/m2  SpO2 98%  LMP 12/21/2012  Physical Exam  Nursing note and vitals reviewed. Constitutional: She is oriented to person, place, and time. She appears well-developed and well-nourished. No distress.  HENT:  Head: Normocephalic and atraumatic.  Eyes: Conjunctivae and EOM are normal. Pupils are equal, round, and reactive to light. Right eye exhibits no discharge. Left eye exhibits no discharge.  Neck: Normal range of motion. Neck supple. No tracheal deviation present.  Cardiovascular: Normal rate, regular rhythm, normal heart sounds and intact distal pulses.  Exam reveals no friction rub.   No murmur heard. Peripheral pulses palpable.  No pitting edema.  No leg swelling.  Pulmonary/Chest: Effort normal and breath sounds normal. No respiratory distress. She has no wheezes. She has no rales.  Abdominal: Soft. Bowel sounds are normal. She exhibits no distension. There is no tenderness. There is no rebound and no guarding.  Musculoskeletal: Normal range of motion. She exhibits  tenderness. She exhibits no edema.  Limited ROM to right shoulder due to pain in the right chest. Pain upon palpation to the right chest and upper right side of back. Sensation intact. Pulses palpable.   Lymphadenopathy:    She has no cervical adenopathy.  Neurological: She is alert and oriented to person, place, and time. No cranial nerve deficit. She exhibits normal muscle tone. Coordination normal.  Skin: Skin is warm and dry.  No rash noted. She is not diaphoretic. No erythema.  Psychiatric: She has a normal mood and affect. Her behavior is normal. Thought content normal.    ED Course  Procedures (including critical care time)  Labs Reviewed  BASIC METABOLIC PANEL - Abnormal; Notable for the following:    Glucose, Bld 113 (*)    All other components within normal limits  CBC WITH DIFFERENTIAL  TROPONIN I   Results for orders placed during the hospital encounter of 01/29/13  CBC WITH DIFFERENTIAL      Result Value Range   WBC 10.2  4.0 - 10.5 K/uL   RBC 4.68  3.87 - 5.11 MIL/uL   Hemoglobin 13.1  12.0 - 15.0 g/dL   HCT 16.1  09.6 - 04.5 %   MCV 84.6  78.0 - 100.0 fL   MCH 28.0  26.0 - 34.0 pg   MCHC 33.1  30.0 - 36.0 g/dL   RDW 40.9  81.1 - 91.4 %   Platelets 246  150 - 400 K/uL   Neutrophils Relative 58  43 - 77 %   Neutro Abs 6.0  1.7 - 7.7 K/uL   Lymphocytes Relative 31  12 - 46 %   Lymphs Abs 3.2  0.7 - 4.0 K/uL   Monocytes Relative 9  3 - 12 %   Monocytes Absolute 0.9  0.1 - 1.0 K/uL   Eosinophils Relative 2  0 - 5 %   Eosinophils Absolute 0.2  0.0 - 0.7 K/uL   Basophils Relative 0  0 - 1 %   Basophils Absolute 0.0  0.0 - 0.1 K/uL  BASIC METABOLIC PANEL      Result Value Range   Sodium 137  135 - 145 mEq/L   Potassium 3.8  3.5 - 5.1 mEq/L   Chloride 100  96 - 112 mEq/L   CO2 27  19 - 32 mEq/L   Glucose, Bld 113 (*) 70 - 99 mg/dL   BUN 15  6 - 23 mg/dL   Creatinine, Ser 7.82  0.50 - 1.10 mg/dL   Calcium 9.4  8.4 - 95.6 mg/dL   GFR calc non Af Amer >90  >90  mL/min   GFR calc Af Amer >90  >90 mL/min  TROPONIN I      Result Value Range   Troponin I <0.30  <0.30 ng/mL   Dg Chest 2 View  01/29/2013  *RADIOLOGY REPORT*  Clinical Data: Right chest pain.  Cough.  Pharyngitis.  CHEST - 2 VIEW  Comparison: None.  Findings: Heart size near the upper limit of normal.  Minimal linear density in the lingula.  Otherwise, clear lungs.  Mild to moderate dextroconvex thoracic scoliosis.  IMPRESSION: Minimal linear atelectasis or scarring in the lingula.  Otherwise, unremarkable examination.   Original Report Authenticated By: Beckie Salts, M.D.      Date: 01/29/2013  Rate: 79  Rhythm: normal sinus rhythm  QRS Axis: normal  Intervals: normal  ST/T Wave abnormalities: normal  Conduction Disutrbances:none  Narrative Interpretation:   Old EKG Reviewed: none available    1. Costochondral chest pain   2. Chest pain   3. History of fibromyalgia   4. History of Sjogren's disease       MDM  DDx: Acute coronary syndrome Costochondritis Fibromyalgia flare-up  I personally evaluated and examined patient. Norco given for pain - patient reported improvement in pain. CBC and BMP negative findings. Chest x-ray showed minimal atelectasis or scarring to lingula - patient was a former  smoker - no acute findings. EKG negative for STEMI/NSTEMI - troponins negative (<0.03). Case discussed with Dr. Blanchard Mane, Dr. Bebe Shaggy evaluated and examined patient. PERC negative. Patient afebrile, normotensive, non-tachycardic, alert, happy affect. Patient aseptic, not toxic appearing. No signs of infarction. No sign of pneumonia. No neurovascular damage noted. Suspected musculoskeletal condition - possible Fibromyalgia flare-up, etiology of pain unknown. Discharged patient. Discussed with patient to follow-up with Rheumatologist, Dr. Corliss Skains, and PCP regarding condition and re-evaluation. Discharged patient with prednisone x 4 days - discussed with patient how to take  medication. Discussed with patient to increase physical activity to help build strength and tone to the body. Discussed with patient that if symptoms are to worsen to please report back to the ED. Patient agreed to plan of care, understood, all questions answered.           Raymon Mutton, PA-C 01/29/13 1705

## 2013-01-31 NOTE — ED Provider Notes (Signed)
Medical screening examination/treatment/procedure(s) were conducted as a shared visit with non-physician practitioner(s) and myself.  I personally evaluated the patient during the encounter  Pt with reproducible CP on my exam EKG reviewed and no signs of ischemia Doubt PE on my assessment Stable in the ED   Joya Gaskins, MD 01/31/13 [phone]

## 2014-05-24 ENCOUNTER — Encounter: Payer: Self-pay | Admitting: Neurology

## 2014-05-28 ENCOUNTER — Telehealth: Payer: Self-pay | Admitting: Internal Medicine

## 2014-05-28 ENCOUNTER — Ambulatory Visit: Payer: Medicare HMO | Admitting: Neurology

## 2014-05-28 ENCOUNTER — Telehealth: Payer: Self-pay | Admitting: Neurology

## 2014-05-28 NOTE — Telephone Encounter (Signed)
Pt states her LFT's are elevated and she may need a liver biopsy. Pt scheduled to see Dr. Henrene Pastor 05/30/14@3 :15pm. Pt aware of appt and states she will bring her records with her to the visit from her PCP.

## 2014-05-28 NOTE — Telephone Encounter (Signed)
Pts PCP is Dr. Jefm Petty.

## 2014-05-28 NOTE — Telephone Encounter (Signed)
This patient did not show for a new patient appointment today. 

## 2014-05-30 ENCOUNTER — Ambulatory Visit (INDEPENDENT_AMBULATORY_CARE_PROVIDER_SITE_OTHER): Payer: Medicare HMO | Admitting: Internal Medicine

## 2014-05-30 ENCOUNTER — Encounter: Payer: Self-pay | Admitting: Internal Medicine

## 2014-05-30 ENCOUNTER — Ambulatory Visit (INDEPENDENT_AMBULATORY_CARE_PROVIDER_SITE_OTHER): Payer: Medicare HMO

## 2014-05-30 VITALS — BP 118/78 | HR 96 | Ht 65.5 in | Wt 250.2 lb

## 2014-05-30 DIAGNOSIS — R7989 Other specified abnormal findings of blood chemistry: Secondary | ICD-10-CM

## 2014-05-30 DIAGNOSIS — K760 Fatty (change of) liver, not elsewhere classified: Secondary | ICD-10-CM

## 2014-05-30 DIAGNOSIS — R945 Abnormal results of liver function studies: Principal | ICD-10-CM

## 2014-05-30 DIAGNOSIS — K7689 Other specified diseases of liver: Secondary | ICD-10-CM

## 2014-05-30 DIAGNOSIS — K219 Gastro-esophageal reflux disease without esophagitis: Secondary | ICD-10-CM

## 2014-05-30 LAB — CBC WITH DIFFERENTIAL/PLATELET
BASOS ABS: 0.1 10*3/uL (ref 0.0–0.1)
Basophils Relative: 0.7 % (ref 0.0–3.0)
EOS ABS: 0.2 10*3/uL (ref 0.0–0.7)
Eosinophils Relative: 1.4 % (ref 0.0–5.0)
HCT: 39.3 % (ref 36.0–46.0)
HEMOGLOBIN: 12.9 g/dL (ref 12.0–15.0)
LYMPHS ABS: 3.8 10*3/uL (ref 0.7–4.0)
LYMPHS PCT: 33 % (ref 12.0–46.0)
MCHC: 32.8 g/dL (ref 30.0–36.0)
MCV: 78.8 fl (ref 78.0–100.0)
Monocytes Absolute: 0.9 10*3/uL (ref 0.1–1.0)
Monocytes Relative: 7.5 % (ref 3.0–12.0)
NEUTROS ABS: 6.7 10*3/uL (ref 1.4–7.7)
Neutrophils Relative %: 57.4 % (ref 43.0–77.0)
PLATELETS: 304 10*3/uL (ref 150.0–400.0)
RBC: 4.98 Mil/uL (ref 3.87–5.11)
RDW: 14.5 % (ref 11.5–15.5)
WBC: 11.6 10*3/uL — ABNORMAL HIGH (ref 4.0–10.5)

## 2014-05-30 LAB — IBC PANEL
Iron: 47 ug/dL (ref 42–145)
Saturation Ratios: 11.1 % — ABNORMAL LOW (ref 20.0–50.0)
Transferrin: 301.6 mg/dL (ref 212.0–360.0)

## 2014-05-30 LAB — HEPATIC FUNCTION PANEL
ALBUMIN: 3.8 g/dL (ref 3.5–5.2)
ALT: 209 U/L — ABNORMAL HIGH (ref 0–35)
AST: 133 U/L — ABNORMAL HIGH (ref 0–37)
Alkaline Phosphatase: 78 U/L (ref 39–117)
BILIRUBIN DIRECT: 0.1 mg/dL (ref 0.0–0.3)
TOTAL PROTEIN: 7.7 g/dL (ref 6.0–8.3)
Total Bilirubin: 0.6 mg/dL (ref 0.2–1.2)

## 2014-05-30 LAB — IRON: IRON: 47 ug/dL (ref 42–145)

## 2014-05-30 LAB — PROTIME-INR
INR: 1.1 ratio — AB (ref 0.8–1.0)
PROTHROMBIN TIME: 12 s (ref 9.6–13.1)

## 2014-05-30 NOTE — Patient Instructions (Signed)
  Your physician has requested that you go to the basement for lab work before leaving today   You have been scheduled for an abdominal ultrasound at [facility] (1st floor of hospital) on 06/05/2014 at 9:00am. Please arrive 15 minutes prior to your appointment for registration. Make certain not to have anything to eat or drink 6 hours prior to your appointment. Should you need to reschedule your appointment, please contact radiology at [phone]. This test typically takes about 30 minutes to perform.  Please follow up with Dr. Henrene Pastor on 07/18/2014 at 10:45am.

## 2014-05-30 NOTE — Progress Notes (Signed)
HISTORY OF PRESENT ILLNESS:  [name] is a 43 y.o. female with hypertension, fibromyalgia, diabetes, and morbid obesity who is sent today by her PCP regarding elevated hepatic transaminases. Patient has had this for several years, though her enzyme levels have risen slightly over time. She was seen in this office August 2010 regarding nausea with vomiting and GERD. See that dictation. Subsequent upper endoscopy revealed reflux esophagitis. She was treated with PPI. Patient denies a personal or family history of liver disease. She has been told that she has fatty liver on imaging previously. Recent hepatitis serologies were negative. She denies IV drug use. One tattoo. She has chronic pain syndrome and has been on a number of agents for pain control. These have been held recent weeks because of liver test abnormalities. She has had progressive weight gain.. she is accompanied today by her husband. Outside office note and laboratories reviewed  REVIEW OF SYSTEMS:  All non-GI ROS negative except for joint aches  Past Medical History  Diagnosis Date  . Hypertension   . Depression   . Hypercholesteremia   . Sjoegren syndrome   . Fibromyalgia   . Diabetes   . Anxiety   . Hypomagnesemia   . Elevated LFTs   . Morbid obesity     Past Surgical History  Procedure Laterality Date  . Wisdom tooth extraction    . Essure tubal ligation    . Tonsillectomy      Social History [name]  reports that she has quit smoking. Her smoking use included Cigarettes. She smoked 0.00 packs per day. She has never used smokeless tobacco. She reports that she does not drink alcohol or use illicit drugs.  family history includes Diabetes in her father and mother; Lupus in her paternal aunt; Multiple sclerosis in her mother; Prostate cancer in her father; Psoriasis in her father; Rheum arthritis in her paternal aunt.  Allergies  Allergen Reactions  . Penicillins     REACTION: hives  . Sulfonamide  Derivatives     REACTION: hives       PHYSICAL EXAMINATION: Vital signs: BP 118/78  Pulse 96  Ht 5' 5.5" (1.664 m)  Wt 250 lb 4 oz (113.513 kg)  BMI 41.00 kg/m2  LMP 05/07/2014  Constitutional: Obese, generally well-appearing, no acute distress Psychiatric: alert and oriented x3, cooperative Eyes: extraocular movements intact, anicteric, conjunctiva pink Mouth: oral pharynx moist, no lesions Neck: supple no lymphadenopathy Cardiovascular: heart regular rate and rhythm, no murmur Lungs: clear to auscultation bilaterally Abdomen: soft, obese, nontender, nondistended, no obvious ascites, no peritoneal signs, normal bowel sounds, no organomegaly Rectal: Omitted Extremities: no lower extremity edema bilaterally Skin: no lesions on visible extremities Neuro: No focal deficits. No asterixis.    ASSESSMENT:  #1. Chronic elevation of hepatic transaminases. Most likely fatty liver disease #2. GERD. Currently on no medication #3. Chronic pain syndrome and other general medical problems  #4. Morbid obesity   PLAN:  #1. Repeat abdominal ultrasound #2. Expand laboratory profile to investigate for multiple causes for potential elevated hepatic transaminases. As well, prothrombin time #3. Followup office visit in 4-6 weeks to review the above

## 2014-05-31 LAB — MITOCHONDRIAL ANTIBODIES: MITOCHONDRIAL M2 AB, IGG: 0.31 (ref <0.91)

## 2014-05-31 LAB — CYCLOSPORINE LEVEL: Cyclosporine, Blood: <30 ng/mL — ABNORMAL LOW (ref 140–330)

## 2014-05-31 LAB — ANA: Anti Nuclear Antibody(ANA): NEGATIVE

## 2014-05-31 LAB — ANTI-SMOOTH MUSCLE ANTIBODY, IGG: SMOOTH MUSCLE AB: 14 U (ref <20)

## 2014-05-31 LAB — TSH: TSH: 2.77 u[IU]/mL (ref 0.35–4.50)

## 2014-05-31 LAB — FERRITIN: Ferritin: 43.4 ng/mL (ref 10.0–291.0)

## 2014-06-01 LAB — ALPHA-1-ANTITRYPSIN: A-1 Antitrypsin, Ser: 148 mg/dL (ref 83–199)

## 2014-06-05 ENCOUNTER — Ambulatory Visit (HOSPITAL_COMMUNITY)
Admission: RE | Admit: 2014-06-05 | Discharge: 2014-06-05 | Disposition: A | Discharge status: Home / Self Care | Payer: Medicare HMO | Source: Ambulatory Visit | Attending: Internal Medicine | Admitting: Internal Medicine

## 2014-06-05 DIAGNOSIS — R7989 Other specified abnormal findings of blood chemistry: Secondary | ICD-10-CM | POA: Diagnosis present

## 2014-06-05 DIAGNOSIS — K7689 Other specified diseases of liver: Secondary | ICD-10-CM | POA: Insufficient documentation

## 2014-06-05 DIAGNOSIS — R945 Abnormal results of liver function studies: Secondary | ICD-10-CM

## 2014-06-08 ENCOUNTER — Encounter: Payer: Self-pay | Admitting: Internal Medicine

## 2014-06-08 ENCOUNTER — Telehealth: Payer: Self-pay

## 2014-06-08 NOTE — Telephone Encounter (Signed)
Message copied by Audrea Muscat on Fri Jun 08, 2014  9:46 AM ------      Message from: HUNT, [place] R      Created: Mon Jun 04, 2014 10:57 AM      Regarding: FW: Cyclosporin level       Magda Paganini,            Please send credit slip for this lol.            Vaughan Basta      ----- Message -----         From: Irene Shipper, MD         Sent: 06/04/2014  10:43 AM           To: Maury Dus, RN      Subject: RE: Cyclosporin level                                    Ordered in error.      ----- Message -----         From: Maury Dus, RN         Sent: 06/04/2014  10:10 AM           To: Irene Shipper, MD      Subject: Cyclosporin level                                        Dr. Henrene Pastor,            I checked with Magda Paganini regarding this lab and she has a copy of the lab sheet with the lab highlighted. If this was ordered in error we can send down a credit slip to the lab. Just wanted to double check with you since [facility] had the slip.            Thanks,      Vaughan Basta             ------

## 2014-06-08 NOTE — Telephone Encounter (Signed)
Faxed credit form for Cyclosporin lab ordered in error to the lab.

## 2014-06-19 ENCOUNTER — Telehealth: Payer: Self-pay

## 2014-06-19 NOTE — Telephone Encounter (Signed)
Lab form was filled out and faxed to have this lab and subsequent charge credited back to patient.

## 2014-06-19 NOTE — Telephone Encounter (Signed)
Message copied by Audrea Muscat on Tue Jun 19, 2014  2:06 PM ------      Message from: Irene Shipper      Created: Thu May 31, 2014 11:44 AM       A cyclosporin level some how was drawn, but should not have been. Please make sure that the patient is not charged for this error. Thanks       ----- Message -----         From: [facility]         Sent: 05/31/2014   8:04 AM           To: Irene Shipper, MD                   ------

## 2014-07-10 ENCOUNTER — Other Ambulatory Visit: Payer: Self-pay | Admitting: Obstetrics and Gynecology

## 2014-07-10 DIAGNOSIS — Z803 Family history of malignant neoplasm of breast: Secondary | ICD-10-CM

## 2014-07-18 ENCOUNTER — Ambulatory Visit: Payer: Medicare HMO | Admitting: Internal Medicine

## 2014-07-26 ENCOUNTER — Other Ambulatory Visit: Payer: Medicare HMO

## 2014-08-24 DIAGNOSIS — M3500 Sicca syndrome, unspecified: Secondary | ICD-10-CM | POA: Insufficient documentation

## 2014-08-24 DIAGNOSIS — F329 Major depressive disorder, single episode, unspecified: Secondary | ICD-10-CM | POA: Diagnosis present

## 2014-09-03 ENCOUNTER — Encounter: Payer: Self-pay | Admitting: Internal Medicine

## 2015-01-21 ENCOUNTER — Other Ambulatory Visit: Payer: Self-pay | Admitting: *Deleted

## 2015-01-21 ENCOUNTER — Ambulatory Visit (INDEPENDENT_AMBULATORY_CARE_PROVIDER_SITE_OTHER): Payer: Medicare HMO | Admitting: Nurse Practitioner

## 2015-01-21 ENCOUNTER — Encounter: Payer: Self-pay | Admitting: Nurse Practitioner

## 2015-01-21 ENCOUNTER — Other Ambulatory Visit: Payer: Self-pay

## 2015-01-21 VITALS — BP 122/68 | HR 60 | Ht 67.0 in | Wt 250.0 lb

## 2015-01-21 DIAGNOSIS — D509 Iron deficiency anemia, unspecified: Secondary | ICD-10-CM | POA: Insufficient documentation

## 2015-01-21 DIAGNOSIS — R197 Diarrhea, unspecified: Secondary | ICD-10-CM | POA: Diagnosis not present

## 2015-01-21 MED ORDER — MOVIPREP 100 G PO SOLR: 1.0000 | Freq: Once | ORAL | Status: DC

## 2015-01-21 NOTE — Progress Notes (Signed)
HPI :   Patient is a 44 year old female, known remotely to Dr. Henrene Pastor. She had an upper endoscopy August 2010 for evaluation of nausea and vomiting. Only findings included mild esophagitis and erythema in distal bulb of duodenum. Patient is referred by PCP for evaluation of diarrhea.  Patient gives a nearly one-year history of loose, nonbloody stool. She has several episodes every morning upon rising, even prior to breakfast . No associated abdominal pain, urgency or vomiting.  Diarrhea usually subsides late morning, eating doesn't seem to have any effect on the diarrhea.  Metformin was stopped back in September but diarrhea did not improve. No new medications other than plaquenil started in October. No dietary changes, no use of artificial sweeteners. Her weight is stable.  Patient's maternal grandfather had ulcerative colitis.  Recent labs from PCP drawn 01/15/15: BUN 15, creatinine 0.6,  AST 27,  ALT 28,  total bili 0.6,  alkaline phosphatase 59,  albumin 3.7, WBC 10.3 hemoglobin 11.4,  platelet count 294,  MCV 78  Past Medical History  Diagnosis Date  . Hypertension   . Depression   . Hypercholesteremia   . Sjoegren syndrome   . Fibromyalgia   . Diabetes   . Anxiety   . Hypomagnesemia   . Elevated LFTs   . Morbid obesity     Family History  Problem Relation Age of Onset  . Multiple sclerosis Mother   . Psoriasis Father   . Prostate cancer Father   . Diabetes Mother     Juvenile  . Diabetes Father   . Lupus Paternal Aunt   . Rheum arthritis Paternal Aunt    History  Substance Use Topics  . Smoking status: Former Smoker    Types: Cigarettes  . Smokeless tobacco: Never Used  . Alcohol Use: No   Current Outpatient Prescriptions  Medication Sig Dispense Refill  . ALPRAZolam (XANAX) 1 MG tablet Take 1 mg by mouth 2 (two) times daily as needed for anxiety.    Marland Kitchen buPROPion (WELLBUTRIN XL) 300 MG 24 hr tablet Take 300 mg by mouth daily.    Marland Kitchen escitalopram (LEXAPRO) 10 MG  tablet Take 40 mg by mouth daily.     . hydroxychloroquine (PLAQUENIL) 200 MG tablet Take 400 mg by mouth daily.    . naproxen (NAPROSYN) 500 MG tablet Take 500 mg by mouth 2 (two) times daily as needed.     . pilocarpine (SALAGEN) 7.5 MG tablet Take 7.5 mg by mouth 3 (three) times daily.     . TURMERIC PO Take 1 tablet by mouth daily.     No current facility-administered medications for this visit.   Allergies  Allergen Reactions  . Penicillins     REACTION: hives  . Sulfonamide Derivatives     REACTION: hives     Review of Systems: All systems reviewed and negative except where noted in HPI.   Physical Exam: BP 122/68 mmHg  Pulse 60  Ht 5\' 7"  (1.702 m)  Wt 250 lb (113.399 kg)  BMI 39.15 kg/m2  LMP 12/22/2014 Constitutional: Pleasant, obese white female in no acute distress. HEENT: Normocephalic and atraumatic. Conjunctivae are normal. No scleral icterus. Neck supple.  Cardiovascular: Normal rate, regular rhythm.  Pulmonary/chest: Effort normal and breath sounds normal. No wheezing, rales or rhonchi. Abdominal: Soft, nondistended, diffusely tender. Bowel sounds active throughout. There are no masses palpable. No hepatomegaly. Extremities: no edema Lymphadenopathy: No cervical adenopathy noted. Neurological: Alert and oriented to person place and time. Skin: Skin is  warm and dry. No rashes noted. Psychiatric: Normal mood and affect. Behavior is normal.   ASSESSMENT AND PLAN:  46. 44 year old female with nearly one-year history of loose nonbloody stool every morning upon rising. Celiac testing negative (TTG IgA, TTG IgG, Endomysial antibody, Gliadin IgA and IgG). Will scan into EPIC.  Loose stool does not sound infectious, especially given chronicity. Rule out microscopic colitis, IBD. We discussed empirical course of antibiotics versus proceeding with colonoscopy. Patient is interested in proceeding with colonoscopy . The risks, benefits, and alternatives to colonoscopy with  possible biopsy and possible polypectomy were discussed with the patient and she consents to proceed.   2. Mild microcytic anemia. Most recent hemoglobin 11.4, MCV 78. Patient has heavy menstrual cycles.  3. History of elevated LFTs ( in EPIC). In July 2015 AST 133, AST 209. Autoimmune / genetic markers were  negative. Most recent transaminases drawn this month were normal.  4.  Sjogren's syndrome, on Plaquenil. Followed at [facility].   5. Fibromyalgia.   CC. Jefm Petty, MD

## 2015-01-21 NOTE — Patient Instructions (Signed)
You are scheduled for a colonoscopy with Dr Henrene Pastor on 01/31/15.

## 2015-01-22 ENCOUNTER — Other Ambulatory Visit: Payer: Self-pay

## 2015-01-22 DIAGNOSIS — R197 Diarrhea, unspecified: Secondary | ICD-10-CM

## 2015-01-22 NOTE — Progress Notes (Signed)
Agree with initial assessment and plans as outlined 

## 2015-01-24 ENCOUNTER — Telehealth: Payer: Self-pay | Admitting: Internal Medicine

## 2015-01-24 NOTE — Telephone Encounter (Signed)
Called number 3 pm no answer. [place]. Marie PV Called again 325 pm no answer. LMTRC marie PV

## 2015-01-24 NOTE — Telephone Encounter (Signed)
Pt states she has a weak stomach and she was wondering what could she do to help her not vomit Pt states she has been asleep and that's why she didn't answer She states that she got a sample of prep today from the office but nothing is documented of this  Instructed her she can use hard candy, chicken broth, chase with coke or juice, she can flavor with something . Instructed to drink  at her pace and take a little longer if needed Instructed to call with further questions Marijean Niemann

## 2015-01-30 ENCOUNTER — Other Ambulatory Visit: Payer: Self-pay | Admitting: *Deleted

## 2015-01-31 ENCOUNTER — Other Ambulatory Visit: Payer: Self-pay | Admitting: Internal Medicine

## 2015-01-31 ENCOUNTER — Ambulatory Visit (AMBULATORY_SURGERY_CENTER): Payer: Medicare HMO | Admitting: Internal Medicine

## 2015-01-31 ENCOUNTER — Encounter: Payer: Self-pay | Admitting: Internal Medicine

## 2015-01-31 VITALS — BP 133/75 | HR 65 | Temp 97.6°F | Resp 16 | Ht 67.0 in | Wt 250.0 lb

## 2015-01-31 DIAGNOSIS — D122 Benign neoplasm of ascending colon: Secondary | ICD-10-CM

## 2015-01-31 DIAGNOSIS — R197 Diarrhea, unspecified: Secondary | ICD-10-CM | POA: Diagnosis not present

## 2015-01-31 MED ORDER — SODIUM CHLORIDE 0.9 % IV SOLN: 500.0000 mL | INTRAVENOUS | Status: DC

## 2015-01-31 NOTE — Progress Notes (Signed)
Called to room to assist during endoscopic procedure.  Patient ID and intended procedure confirmed with present staff. Received instructions for my participation in the procedure from the performing physician.  

## 2015-01-31 NOTE — Progress Notes (Signed)
To recovery, report to [place],RN. VSS 

## 2015-01-31 NOTE — Op Note (Signed)
[place]  [address] [place], [id number]   COLONOSCOPY PROCEDURE REPORT  PATIENT: [name], [name]  MR#: [id number] BIRTHDATE: 08/17/1971 , 82  yrs. old GENDER: female ENDOSCOPIST: Eustace Quail, MD REFERRED VV:[id number] Jeralene Huff, M.D. PROCEDURE DATE:  01/31/2015 PROCEDURE:   Colonoscopy, diagnostic, Colonoscopy with snare polypectomy x 1, and Colonoscopy with biopsies First Screening Colonoscopy - Avg.  risk and is 50 yrs.  old or older - No.  Prior Negative Screening - Now for repeat screening. N/A  History of Adenoma - Now for follow-up colonoscopy & has been > or = to 3 yrs.  N/A ASA CLASS:   Class II INDICATIONS:Clinically significant diarrhea of unexplained origin and Patient is not applicable for Colorectal Neoplasm Risk Assessment for this procedure. MEDICATIONS: Monitored anesthesia care and Propofol 430 mg IV  DESCRIPTION OF PROCEDURE:   After the risks benefits and alternatives of the procedure were thoroughly explained, informed consent was obtained.  The digital rectal exam revealed no abnormalities of the rectum.   The [id number]  endoscope was introduced through the anus and advanced to the cecum, which was identified by both the appendix and ileocecal valve. No adverse events experienced.   The quality of the prep was excellent. (MoviPrep was used)  The instrument was then slowly withdrawn as the colon was fully examined.  COLON FINDINGS: The examined terminal ileum appeared normal.   A single polyp measuring 6 mm in size was found in the ascending colon.  A polypectomy was performed with a cold snare.  The resection was complete, the polyp tissue was completely retrieved and sent to histology.   The examination was otherwise normal. Random biopsies of the colon were taken to evaluate diarrhea and exclude microscopic colitis.  Retroflexed views revealed internal hemorrhoids. The time to cecum = 3.4 Withdrawal time = 11.6   The scope  was withdrawn and the procedure completed. COMPLICATIONS: There were no immediate complications.  ENDOSCOPIC IMPRESSION: 1.   The examined terminal ileum appeared to be normal 2.   Single polyp was found in the ascending colon; polypectomy was performed with a cold snare 3.   The examination was otherwise normal  RECOMMENDATIONS: 1.  Repeat colonoscopy in 5 years if polyp adenomatous; otherwise 10 years 2.  Await biopsy results . Dr. Henrene Pastor will send you a letter with the results and any further recommendations if appropriate 3. Okay to try Imodium as needed for loose stools  eSigned:  Eustace Quail, MD 01/31/2015 4:50 PM   cc: Jefm Petty, MD and The Patient

## 2015-01-31 NOTE — Patient Instructions (Signed)
YOU HAD AN ENDOSCOPIC PROCEDURE TODAY AT THE [facility]:   Refer to the procedure report that was given to you for any specific questions about what was found during the examination.  If the procedure report does not answer your questions, please call your gastroenterologist to clarify.  If you requested that your care partner not be given the details of your procedure findings, then the procedure report has been included in a sealed envelope for you to review at your convenience later.  YOU SHOULD EXPECT: Some feelings of bloating in the abdomen. Passage of more gas than usual.  Walking can help get rid of the air that was put into your GI tract during the procedure and reduce the bloating. If you had a lower endoscopy (such as a colonoscopy or flexible sigmoidoscopy) you may notice spotting of blood in your stool or on the toilet paper. If you underwent a bowel prep for your procedure, you may not have a normal bowel movement for a few days.  Please Note:  You might notice some irritation and congestion in your nose or some drainage.  This is from the oxygen used during your procedure.  There is no need for concern and it should clear up in a day or so.  SYMPTOMS TO REPORT IMMEDIATELY:   Following lower endoscopy (colonoscopy or flexible sigmoidoscopy):  Excessive amounts of blood in the stool  Significant tenderness or worsening of abdominal pains  Swelling of the abdomen that is new, acute  Fever of 100F or higher   For urgent or emergent issues, a gastroenterologist can be reached at any hour by calling [phone].   DIET: Your first meal following the procedure should be a small meal and then it is ok to progress to your normal diet. Heavy or fried foods are harder to digest and may make you feel nauseous or bloated.  Likewise, meals heavy in dairy and vegetables can increase bloating.  Drink plenty of fluids but you should avoid alcoholic beverages for 24  hours.  ACTIVITY:  You should plan to take it easy for the rest of today and you should NOT DRIVE or use heavy machinery until tomorrow (because of the sedation medicines used during the test).    FOLLOW UP: Our staff will call the number listed on your records the next business day following your procedure to check on you and address any questions or concerns that you may have regarding the information given to you following your procedure. If we do not reach you, we will leave a message.  However, if you are feeling well and you are not experiencing any problems, there is no need to return our call.  We will assume that you have returned to your regular daily activities without incident.  If any biopsies were taken you will be contacted by phone or by letter within the next 1-3 weeks.  Please call us at [phone] if you have not heard about the biopsies in 3 weeks.    SIGNATURES/CONFIDENTIALITY: You and/or your care partner have signed paperwork which will be entered into your electronic medical record.  These signatures attest to the fact that that the information above on your After Visit Summary has been reviewed and is understood.  Full responsibility of the confidentiality of this discharge information lies with you and/or your care-partner.  Read all of the handouts given to you by your recovery room nurse. 

## 2015-02-01 ENCOUNTER — Telehealth: Payer: Self-pay | Admitting: *Deleted

## 2015-02-01 NOTE — Telephone Encounter (Signed)
No answer. No identifier. Questionable answering machine...attempted to leave message but was cut off midway.

## 2015-02-05 ENCOUNTER — Encounter: Payer: Self-pay | Admitting: Internal Medicine

## 2015-04-15 ENCOUNTER — Telehealth: Payer: Self-pay | Admitting: Internal Medicine

## 2015-04-15 NOTE — Telephone Encounter (Signed)
Spoke with pt and she is not having diarrhea, states she is having multiple stools. Pt states she has a hard time leaving the house. Discussed with pt that he suggested she take Imodium on her colon report. Pt has not been doing this, pt will try the imodium and call back if no improvement.

## 2015-05-02 ENCOUNTER — Telehealth: Payer: Self-pay | Admitting: Internal Medicine

## 2015-05-02 NOTE — Telephone Encounter (Signed)
Discussed with pt that she should take the Imodium prn per Dr. Henrene Pastor. Pt verbalized understanding.

## 2016-06-06 ENCOUNTER — Encounter (HOSPITAL_BASED_OUTPATIENT_CLINIC_OR_DEPARTMENT_OTHER): Payer: Self-pay | Admitting: *Deleted

## 2016-06-06 ENCOUNTER — Emergency Department (HOSPITAL_BASED_OUTPATIENT_CLINIC_OR_DEPARTMENT_OTHER)
Admission: EM | Admit: 2016-06-06 | Discharge: 2016-06-06 | Disposition: A | Discharge status: Home / Self Care | Payer: Medicare HMO | Attending: Emergency Medicine | Admitting: Emergency Medicine

## 2016-06-06 DIAGNOSIS — I10 Essential (primary) hypertension: Secondary | ICD-10-CM | POA: Insufficient documentation

## 2016-06-06 DIAGNOSIS — E119 Type 2 diabetes mellitus without complications: Secondary | ICD-10-CM | POA: Diagnosis not present

## 2016-06-06 DIAGNOSIS — R339 Retention of urine, unspecified: Secondary | ICD-10-CM | POA: Diagnosis not present

## 2016-06-06 DIAGNOSIS — Z7984 Long term (current) use of oral hypoglycemic drugs: Secondary | ICD-10-CM | POA: Insufficient documentation

## 2016-06-06 DIAGNOSIS — F329 Major depressive disorder, single episode, unspecified: Secondary | ICD-10-CM | POA: Diagnosis not present

## 2016-06-06 DIAGNOSIS — Z87891 Personal history of nicotine dependence: Secondary | ICD-10-CM | POA: Insufficient documentation

## 2016-06-06 DIAGNOSIS — Z79899 Other long term (current) drug therapy: Secondary | ICD-10-CM | POA: Diagnosis not present

## 2016-06-06 LAB — CBC WITH DIFFERENTIAL/PLATELET
Basophils Absolute: 0 10*3/uL (ref 0.0–0.1)
Basophils Relative: 0 %
EOS ABS: 0.1 10*3/uL (ref 0.0–0.7)
Eosinophils Relative: 1 %
HCT: 39.8 % (ref 36.0–46.0)
HEMOGLOBIN: 13 g/dL (ref 12.0–15.0)
Lymphocytes Relative: 20 %
Lymphs Abs: 1.8 10*3/uL (ref 0.7–4.0)
MCH: 27.1 pg (ref 26.0–34.0)
MCHC: 32.7 g/dL (ref 30.0–36.0)
MCV: 82.9 fL (ref 78.0–100.0)
Monocytes Absolute: 0.7 10*3/uL (ref 0.1–1.0)
Monocytes Relative: 8 %
NEUTROS PCT: 71 %
Neutro Abs: 6.5 10*3/uL (ref 1.7–7.7)
PLATELETS: 261 10*3/uL (ref 150–400)
RBC: 4.8 MIL/uL (ref 3.87–5.11)
RDW: 14.8 % (ref 11.5–15.5)
WBC: 9.1 10*3/uL (ref 4.0–10.5)

## 2016-06-06 LAB — BASIC METABOLIC PANEL
Anion gap: 8 (ref 5–15)
BUN: 17 mg/dL (ref 6–20)
CALCIUM: 8.9 mg/dL (ref 8.9–10.3)
CO2: 25 mmol/L (ref 22–32)
CREATININE: 0.71 mg/dL (ref 0.44–1.00)
Chloride: 104 mmol/L (ref 101–111)
GFR calc non Af Amer: >60 mL/min (ref >60)
Glucose, Bld: 108 mg/dL — ABNORMAL HIGH (ref 65–99)
Potassium: 3.8 mmol/L (ref 3.5–5.1)
SODIUM: 137 mmol/L (ref 135–145)

## 2016-06-06 LAB — URINE MICROSCOPIC-ADD ON
RBC / HPF: NONE SEEN RBC/hpf (ref 0–5)
Squamous Epithelial / LPF: NONE SEEN

## 2016-06-06 LAB — URINALYSIS, ROUTINE W REFLEX MICROSCOPIC
BILIRUBIN URINE: NEGATIVE
Glucose, UA: NEGATIVE mg/dL
Hgb urine dipstick: NEGATIVE
KETONES UR: NEGATIVE mg/dL
Leukocytes, UA: NEGATIVE
NITRITE: NEGATIVE
PROTEIN: 30 mg/dL — AB
Specific Gravity, Urine: 1.018 (ref 1.005–1.030)
pH: 6 (ref 5.0–8.0)

## 2016-06-06 LAB — PREGNANCY, URINE: Preg Test, Ur: NEGATIVE

## 2016-06-06 NOTE — ED Provider Notes (Signed)
[facility] Provider Note   CSN: [id number] Arrival date & time: 06/06/16  [id number]  First Provider Contact:  First MD Initiated Contact with Patient 06/06/16 1039        History   Chief Complaint Chief Complaint  Patient presents with  . Urinary Retention    HPI [name] is a 45 y.o. female.  Patient presents with lower abdominal pain. She states she hasn't been able to urinate for about 18 hours. She has increasing pressure to her lower abdomen. She states she hasn't produced any urine in the last 18 hours. She denies any prior history of urinary issues. No recent dysuria or burning on urination. No hematuria. She denies any nausea vomiting or fevers. No other recent illnesses. She hasn't started any new medications.      Past Medical History:  Diagnosis Date  . Anemia   . Anxiety   . Depression   . Diabetes ([place])   . Elevated LFTs   . Fibromyalgia   . Hypercholesteremia   . Hypertension   . Hypomagnesemia   . Morbid obesity ([place])   . Sjoegren syndrome [facility])     Patient Active Problem List   Diagnosis Date Noted  . Microcytic anemia 01/21/2015  . Diarrhea 01/21/2015  . GERD 06/24/2009  . NAUSEA AND VOMITING 06/24/2009    Past Surgical History:  Procedure Laterality Date  . ECT TREATMENT    . ESSURE TUBAL LIGATION    . TONSILLECTOMY    . WISDOM TOOTH EXTRACTION      OB History    Gravida Para Term Preterm AB Living   2 2 2          SAB TAB Ectopic Multiple Live Births                   Home Medications    Prior to Admission medications   Medication Sig Start Date End Date Taking? Authorizing Provider  ALPRAZolam Duanne Moron) 1 MG tablet Take 1 mg by mouth 2 (two) times daily as needed for anxiety.   Yes Historical Provider, MD  buPROPion (WELLBUTRIN XL) 300 MG 24 hr tablet Take 300 mg by mouth daily.   Yes Historical Provider, MD  escitalopram (LEXAPRO) 10 MG tablet Take 40 mg by mouth daily.    Yes Historical Provider, MD    hydroxychloroquine (PLAQUENIL) 200 MG tablet Take 400 mg by mouth daily.   Yes Historical Provider, MD  naproxen (NAPROSYN) 500 MG tablet Take 500 mg by mouth 2 (two) times daily as needed.    Yes Historical Provider, MD  pilocarpine (SALAGEN) 7.5 MG tablet Take 7.5 mg by mouth 3 (three) times daily.  05/12/14  Yes Historical Provider, MD  sitaGLIPtin (JANUVIA) 50 MG tablet Take 50 mg by mouth.   Yes Historical Provider, MD  TURMERIC PO Take 1 tablet by mouth daily.   Yes Historical Provider, MD    Family History Family History  Problem Relation Age of Onset  . Multiple sclerosis Mother   . Diabetes Mother     Juvenile  . Psoriasis Father   . Prostate cancer Father   . Diabetes Father   . Lupus Paternal Aunt   . Rheum arthritis Paternal Aunt     Social History Social History  Substance Use Topics  . Smoking status: Former Smoker    Types: Cigarettes  . Smokeless tobacco: Never Used  . Alcohol use No     Allergies   Penicillins and Sulfonamide derivatives   Review  of Systems Review of Systems  Constitutional: Negative for chills, diaphoresis, fatigue and fever.  HENT: Negative for congestion, rhinorrhea and sneezing.   Eyes: Negative.   Respiratory: Negative for cough, chest tightness and shortness of breath.   Cardiovascular: Negative for chest pain and leg swelling.  Gastrointestinal: Positive for abdominal pain. Negative for blood in stool, diarrhea, nausea and vomiting.  Genitourinary: Positive for dysuria. Negative for difficulty urinating, flank pain, frequency and hematuria.  Musculoskeletal: Negative for arthralgias and back pain.  Skin: Negative for rash.  Neurological: Negative for dizziness, speech difficulty, weakness, numbness and headaches.     Physical Exam Updated Vital Signs BP 147/60 (BP Location: Right Arm)   Pulse 65   Temp 98.1 F (36.7 C) (Oral)   Resp 18   Ht 5\' 7"  (1.702 m)   Wt 242 lb (109.8 kg)   LMP 05/30/2016 (Exact Date)   SpO2  98%   BMI 37.90 kg/m   Physical Exam  Constitutional: She is oriented to person, place, and time. She appears well-developed and well-nourished.  HENT:  Head: Normocephalic and atraumatic.  Eyes: Pupils are equal, round, and reactive to light.  Neck: Normal range of motion. Neck supple.  Cardiovascular: Normal rate, regular rhythm and normal heart sounds.   Pulmonary/Chest: Effort normal and breath sounds normal. No respiratory distress. She has no wheezes. She has no rales. She exhibits no tenderness.  Abdominal: Soft. Bowel sounds are normal. There is tenderness (Moderate tenderness to suprapubic area). There is no rebound and no guarding.  Musculoskeletal: Normal range of motion. She exhibits no edema.  Lymphadenopathy:    She has no cervical adenopathy.  Neurological: She is alert and oriented to person, place, and time.  Skin: Skin is warm and dry. No rash noted.  Psychiatric: She has a normal mood and affect.     ED Treatments / Results  Labs (all labs ordered are listed, but only abnormal results are displayed) Results for orders placed or performed during the hospital encounter of 06/06/16  Urinalysis, Routine w reflex microscopic  Result Value Ref Range   Color, Urine YELLOW YELLOW   APPearance CLEAR CLEAR   Specific Gravity, Urine 1.018 1.005 - 1.030   pH 6.0 5.0 - 8.0   Glucose, UA NEGATIVE NEGATIVE mg/dL   Hgb urine dipstick NEGATIVE NEGATIVE   Bilirubin Urine NEGATIVE NEGATIVE   Ketones, ur NEGATIVE NEGATIVE mg/dL   Protein, ur 30 (A) NEGATIVE mg/dL   Nitrite NEGATIVE NEGATIVE   Leukocytes, UA NEGATIVE NEGATIVE  Pregnancy, urine  Result Value Ref Range   Preg Test, Ur NEGATIVE NEGATIVE  Basic metabolic panel  Result Value Ref Range   Sodium 137 135 - 145 mmol/L   Potassium 3.8 3.5 - 5.1 mmol/L   Chloride 104 101 - 111 mmol/L   CO2 25 22 - 32 mmol/L   Glucose, Bld 108 (H) 65 - 99 mg/dL   BUN 17 6 - 20 mg/dL   Creatinine, Ser 0.71 0.44 - 1.00 mg/dL    Calcium 8.9 8.9 - 10.3 mg/dL   GFR calc non Af Amer >60 >60 mL/min   GFR calc Af Amer >60 >60 mL/min   Anion gap 8 5 - 15  CBC with Differential  Result Value Ref Range   WBC 9.1 4.0 - 10.5 K/uL   RBC 4.80 3.87 - 5.11 MIL/uL   Hemoglobin 13.0 12.0 - 15.0 g/dL   HCT 39.8 36.0 - 46.0 %   MCV 82.9 78.0 - 100.0 fL  MCH 27.1 26.0 - 34.0 pg   MCHC 32.7 30.0 - 36.0 g/dL   RDW 14.8 11.5 - 15.5 %   Platelets 261 150 - 400 K/uL   Neutrophils Relative % 71 %   Lymphocytes Relative 20 %   Monocytes Relative 8 %   Eosinophils Relative 1 %   Basophils Relative 0 %   Neutro Abs 6.5 1.7 - 7.7 K/uL   Lymphs Abs 1.8 0.7 - 4.0 K/uL   Monocytes Absolute 0.7 0.1 - 1.0 K/uL   Eosinophils Absolute 0.1 0.0 - 0.7 K/uL   Basophils Absolute 0.0 0.0 - 0.1 K/uL  Urine microscopic-add on  Result Value Ref Range   Squamous Epithelial / LPF NONE SEEN NONE SEEN   WBC, UA 0-5 0 - 5 WBC/hpf   RBC / HPF NONE SEEN 0 - 5 RBC/hpf   Bacteria, UA MANY (A) NONE SEEN   Casts HYALINE CASTS (A) NEGATIVE   Urine-Other MUCOUS PRESENT    No results found.   EKG  EKG Interpretation None       Radiology No results found.  Procedures Procedures (including critical care time)  Medications Ordered in ED Medications - No data to display   Initial Impression / Assessment and Plan / ED Course  I have reviewed the triage vital signs and the nursing notes.  Pertinent labs & imaging results that were available during my care of the patient were reviewed by me and considered in my medical decision making (see chart for details).  Clinical Course    Patient had a Foley catheter placed and had over 1000 cc of clear urine return.  Her urine does not appear to be infected. We'll leave the catheter and I advised her to follow-up with her PCP or [facility] within the next week.  Return precautions were given.  Final Clinical Impressions(s) / ED Diagnoses   Final diagnoses:  Urinary retention    New  Prescriptions New Prescriptions   No medications on file     Malvin Johns, MD 06/06/16 1249

## 2016-06-06 NOTE — ED Notes (Signed)
Pt educated regarding usage of leg bag. Pt is adamant she does not want to go home with the catheter.

## 2016-06-06 NOTE — ED Triage Notes (Signed)
Patient states she has been unable to urinate since yesterday afternoon, approximately 18 hours.  C/O pain in the lower abdomen.

## 2016-06-07 LAB — URINE CULTURE: Culture: NO GROWTH

## 2016-09-29 ENCOUNTER — Encounter (HOSPITAL_BASED_OUTPATIENT_CLINIC_OR_DEPARTMENT_OTHER): Payer: Self-pay

## 2016-09-29 ENCOUNTER — Emergency Department (HOSPITAL_BASED_OUTPATIENT_CLINIC_OR_DEPARTMENT_OTHER)
Admission: EM | Admit: 2016-09-29 | Discharge: 2016-09-29 | Disposition: A | Discharge status: Home / Self Care | Payer: Medicare HMO | Attending: Emergency Medicine | Admitting: Emergency Medicine

## 2016-09-29 DIAGNOSIS — Z791 Long term (current) use of non-steroidal anti-inflammatories (NSAID): Secondary | ICD-10-CM | POA: Diagnosis not present

## 2016-09-29 DIAGNOSIS — K5900 Constipation, unspecified: Secondary | ICD-10-CM

## 2016-09-29 DIAGNOSIS — Z87891 Personal history of nicotine dependence: Secondary | ICD-10-CM | POA: Diagnosis not present

## 2016-09-29 DIAGNOSIS — E119 Type 2 diabetes mellitus without complications: Secondary | ICD-10-CM | POA: Diagnosis not present

## 2016-09-29 DIAGNOSIS — Z79899 Other long term (current) drug therapy: Secondary | ICD-10-CM | POA: Diagnosis not present

## 2016-09-29 DIAGNOSIS — I10 Essential (primary) hypertension: Secondary | ICD-10-CM | POA: Diagnosis not present

## 2016-09-29 DIAGNOSIS — Z5321 Procedure and treatment not carried out due to patient leaving prior to being seen by health care provider: Secondary | ICD-10-CM | POA: Insufficient documentation

## 2016-09-29 DIAGNOSIS — Z7984 Long term (current) use of oral hypoglycemic drugs: Secondary | ICD-10-CM | POA: Insufficient documentation

## 2016-09-29 NOTE — ED Triage Notes (Signed)
C/o constipation "for weeks"-unknown last BM

## 2016-09-29 NOTE — ED Notes (Signed)
Pt has not had a normal bowel movement for "weeks."  She started trying miralax and other laxatives and this evening gave herself two enemas without having success.

## 2016-09-29 NOTE — ED Notes (Signed)
Pt went to restroom to get a urine sample and ended up having multiple bowel movements.  She has chosen to leave at this time.

## 2016-09-30 NOTE — ED Provider Notes (Signed)
Patient left the ED AMA prior to my evaluation. I was unable to examine and interview the patient prior to leaving the ED.   Nanda Quinton, MD   Margette Fast, MD 09/30/16 1106

## 2016-10-14 ENCOUNTER — Emergency Department (HOSPITAL_BASED_OUTPATIENT_CLINIC_OR_DEPARTMENT_OTHER): Payer: Medicare HMO

## 2016-10-14 ENCOUNTER — Encounter (HOSPITAL_BASED_OUTPATIENT_CLINIC_OR_DEPARTMENT_OTHER): Payer: Self-pay

## 2016-10-14 ENCOUNTER — Emergency Department (HOSPITAL_BASED_OUTPATIENT_CLINIC_OR_DEPARTMENT_OTHER)
Admission: EM | Admit: 2016-10-14 | Discharge: 2016-10-14 | Disposition: A | Discharge status: Home / Self Care | Payer: Medicare HMO | Attending: Emergency Medicine | Admitting: Emergency Medicine

## 2016-10-14 DIAGNOSIS — E119 Type 2 diabetes mellitus without complications: Secondary | ICD-10-CM | POA: Insufficient documentation

## 2016-10-14 DIAGNOSIS — K5900 Constipation, unspecified: Secondary | ICD-10-CM | POA: Diagnosis present

## 2016-10-14 DIAGNOSIS — I10 Essential (primary) hypertension: Secondary | ICD-10-CM | POA: Diagnosis not present

## 2016-10-14 DIAGNOSIS — K5641 Fecal impaction: Secondary | ICD-10-CM

## 2016-10-14 DIAGNOSIS — Z79899 Other long term (current) drug therapy: Secondary | ICD-10-CM | POA: Diagnosis not present

## 2016-10-14 DIAGNOSIS — Z87891 Personal history of nicotine dependence: Secondary | ICD-10-CM | POA: Insufficient documentation

## 2016-10-14 LAB — PREGNANCY, URINE: Preg Test, Ur: NEGATIVE

## 2016-10-14 MED ORDER — FLEET ENEMA 7-19 GM/118ML RE ENEM
1.0000 | ENEMA | Freq: Once | RECTAL | Status: AC
  Administered 2016-10-14: 1 via RECTAL
  Filled 2016-10-14: qty 1

## 2016-10-14 MED ORDER — POLYETHYLENE GLYCOL 3350 17 G PO PACK: 17.0000 g | PACK | Freq: Every day | ORAL | 0 refills | Status: DC

## 2016-10-14 MED ORDER — LIDOCAINE HCL 2 % EX GEL
1.0000 "application " | Freq: Once | CUTANEOUS | Status: AC
  Administered 2016-10-14: 1 via TOPICAL
  Filled 2016-10-14: qty 20

## 2016-10-14 NOTE — ED Provider Notes (Signed)
[facility] Provider Note   CSN: [id number] Arrival date & time: 10/14/16  1227     History   Chief Complaint Chief Complaint  Patient presents with  . Constipation    HPI [name] is a 45 y.o. female hx of anemia, DM, fibromyalgia, Constipation here presenting with constipation. Patient states that she's been constipated for several months. She has been seeing her primary care doctor for this and has been using MiraLAX, stool softeners, enemas with minimal relief. She tried taking GoLYTELY about week ago but only had some minimal liquid stool came out. She was referred to GI but sent here because of concern for possible fecal impaction. Denies any vomiting and overall is eating well. Denies any fevers or previous abdominal surgeries.   The history is provided by the patient.    Past Medical History:  Diagnosis Date  . Anemia   . Anxiety   . Depression   . Diabetes ([place])   . Elevated LFTs   . Fibromyalgia   . Hypercholesteremia   . Hypertension   . Hypomagnesemia   . Morbid obesity ([place])   . Sjoegren syndrome [facility])     Patient Active Problem List   Diagnosis Date Noted  . Microcytic anemia 01/21/2015  . Diarrhea 01/21/2015  . GERD 06/24/2009  . NAUSEA AND VOMITING 06/24/2009    Past Surgical History:  Procedure Laterality Date  . ECT TREATMENT    . ESSURE TUBAL LIGATION    . TONSILLECTOMY    . WISDOM TOOTH EXTRACTION      OB History    Gravida Para Term Preterm AB Living   2 2 2          SAB TAB Ectopic Multiple Live Births                   Home Medications    Prior to Admission medications   Medication Sig Start Date End Date Taking? Authorizing Provider  buPROPion (WELLBUTRIN XL) 300 MG 24 hr tablet Take 300 mg by mouth daily.    Historical Provider, MD  hydroxychloroquine (PLAQUENIL) 200 MG tablet Take 400 mg by mouth daily.    Historical Provider, MD  naproxen (NAPROSYN) 500 MG tablet Take 500 mg by mouth 2 (two) times daily as  needed.     Historical Provider, MD  sitaGLIPtin (JANUVIA) 50 MG tablet Take 50 mg by mouth.    Historical Provider, MD    Family History Family History  Problem Relation Age of Onset  . Multiple sclerosis Mother   . Diabetes Mother     Juvenile  . Psoriasis Father   . Prostate cancer Father   . Diabetes Father   . Lupus Paternal Aunt   . Rheum arthritis Paternal Aunt     Social History Social History  Substance Use Topics  . Smoking status: Former Smoker    Types: Cigarettes  . Smokeless tobacco: Never Used  . Alcohol use No     Allergies   Penicillins and Sulfonamide derivatives   Review of Systems Review of Systems  Gastrointestinal: Positive for constipation.  All other systems reviewed and are negative.    Physical Exam Updated Vital Signs BP (!) 120/54 (BP Location: Right Arm)   Pulse 84   Temp 98.1 F (36.7 C) (Oral)   Resp 16   Ht 5\' 7"  (1.702 m)   Wt 239 lb (108.4 kg)   LMP 09/22/2016   SpO2 98%   BMI 37.43 kg/m   Physical  Exam  Constitutional: She appears well-developed and well-nourished.  HENT:  Head: Normocephalic.  Eyes: EOM are normal. Pupils are equal, round, and reactive to light.  Neck: Normal range of motion. Neck supple.  Cardiovascular: Normal rate, regular rhythm and normal heart sounds.   Pulmonary/Chest: Effort normal and breath sounds normal. No respiratory distress. She has no wheezes. She has no rales.  Abdominal: Soft. Bowel sounds are normal. She exhibits no distension. There is no tenderness. There is no guarding.  Genitourinary:  Genitourinary Comments: Rectal- + stool impaction   Musculoskeletal: Normal range of motion.  Neurological: She is alert.  Skin: Skin is warm.  Psychiatric: She has a normal mood and affect.  Nursing note and vitals reviewed.    ED Treatments / Results  Labs (all labs ordered are listed, but only abnormal results are displayed) Labs Reviewed  PREGNANCY, URINE    EKG  EKG  Interpretation None       Radiology Dg Abdomen 1 View  Result Date: 10/14/2016 CLINICAL DATA:  Constipation EXAM: ABDOMEN - 1 VIEW COMPARISON:  CT abdomen pelvis 12/23/2008 FINDINGS: Moderate retained stool in the colon. Negative for bowel obstruction. No renal calculi. No acute skeletal abnormality. Bilateral fallopian tube sterilization wires. IMPRESSION: Moderate retained stool in the colon. Electronically Signed   By: Franchot Gallo M.D.   On: 10/14/2016 13:45    Procedures Fecal disimpaction Date/Time: 10/14/2016 3:06 PM Performed by: Drenda Freeze Authorized by: Drenda Freeze  Consent: Verbal consent obtained. Risks and benefits: risks, benefits and alternatives were discussed Consent given by: patient Patient understanding: patient states understanding of the procedure being performed Patient consent: the patient's understanding of the procedure matches consent given Patient identity confirmed: verbally with patient Preparation: Patient was prepped and draped in the usual sterile fashion. Local anesthesia used: yes  Anesthesia: Local anesthesia used: yes Local Anesthetic: topical anesthetic Anesthetic total: 10 mL  Sedation: Patient sedated: no Patient tolerance: Patient tolerated the procedure well with no immediate complications    (including critical care time)  Medications Ordered in ED Medications  lidocaine (XYLOCAINE) 2 % jelly 1 application (1 application Topical Given 10/14/16 1419)  sodium phosphate (FLEET) 7-19 GM/118ML enema 1 enema (1 enema Rectal Given 10/14/16 1443)     Initial Impression / Assessment and Plan / ED Course  I have reviewed the triage vital signs and the nursing notes.  Pertinent labs & imaging results that were available during my care of the patient were reviewed by me and considered in my medical decision making (see chart for details).  Clinical Course     [name] is a 45 y.o. female here with constipation.  No vomiting or fevers. xrays showed constipation. Has fecal impaction on exam. I was able to perform digital disimpaction and able to get some hard stool out. Given enema afterwards and small amount of stool came out. Will have her continue miralax, stool softeners. She will see GI outpatient.    Final Clinical Impressions(s) / ED Diagnoses   Final diagnoses:  None    New Prescriptions New Prescriptions   No medications on file     Drenda Freeze, MD 10/14/16 [phone]

## 2016-10-14 NOTE — ED Triage Notes (Signed)
C/o constipation "for months"-seen here for same last month-NAD-steady gait

## 2016-10-14 NOTE — Discharge Instructions (Signed)
Continue miralax daily and stool softeners. Try enema daily as needed.   Continue enema as needed.   See your GI doctor as scheduled.   Return to ER if you have severe abdominal pain, vomiting, no bowel movement for a week.

## 2016-11-03 ENCOUNTER — Emergency Department (HOSPITAL_BASED_OUTPATIENT_CLINIC_OR_DEPARTMENT_OTHER): Payer: Medicare HMO

## 2016-11-03 ENCOUNTER — Encounter (HOSPITAL_BASED_OUTPATIENT_CLINIC_OR_DEPARTMENT_OTHER): Payer: Self-pay | Admitting: *Deleted

## 2016-11-03 ENCOUNTER — Emergency Department (HOSPITAL_BASED_OUTPATIENT_CLINIC_OR_DEPARTMENT_OTHER)
Admission: EM | Admit: 2016-11-03 | Discharge: 2016-11-04 | Disposition: A | Discharge status: Home / Self Care | Payer: Medicare HMO | Attending: Physician Assistant | Admitting: Physician Assistant

## 2016-11-03 DIAGNOSIS — Z87891 Personal history of nicotine dependence: Secondary | ICD-10-CM | POA: Insufficient documentation

## 2016-11-03 DIAGNOSIS — E119 Type 2 diabetes mellitus without complications: Secondary | ICD-10-CM | POA: Diagnosis not present

## 2016-11-03 DIAGNOSIS — K5900 Constipation, unspecified: Secondary | ICD-10-CM | POA: Diagnosis not present

## 2016-11-03 DIAGNOSIS — I10 Essential (primary) hypertension: Secondary | ICD-10-CM | POA: Diagnosis not present

## 2016-11-03 DIAGNOSIS — K5641 Fecal impaction: Secondary | ICD-10-CM

## 2016-11-03 MED ORDER — FLEET ENEMA 7-19 GM/118ML RE ENEM
ENEMA | RECTAL | Status: AC
  Filled 2016-11-03: qty 1

## 2016-11-03 MED ORDER — IOPAMIDOL (ISOVUE-300) INJECTION 61%
100.0000 mL | Freq: Once | INTRAVENOUS | Status: AC | PRN
  Administered 2016-11-04: 100 mL via INTRAVENOUS

## 2016-11-03 NOTE — ED Provider Notes (Signed)
[facility] DEPT [facility] Provider Note   CSN: [id number] Arrival date & time: 11/03/16  [id number]  By signing my name below, I, Dora Sims, attest that this documentation has been prepared under the direction and in the presence of Ocie Cornfield, PA-C. Electronically Signed: Dora Sims, Scribe. 11/03/2016. 9:02 PM.  History   Chief Complaint Chief Complaint  Patient presents with  . Constipation    The history is provided by the patient. No language interpreter was used.     HPI Comments: [name] is a 46 y.o. female who presents to the Emergency Department complaining of constant constipation over the last few days. She states "I have not had a bowel movement" but does not specify when her last BM was. She states she was seen by her GI doctor PTA today and was told she was totally impacted and was advised to come here to be disimpacted. She reports a h/o recurrent constipation and fecal impaction and has been here a few times in the last couple of months for the same. She has taken enemas in the past with no improvement of her constipation. She has tried various OTC laxatives for her current onset of constipation with no relief. She does not have any regular pain medications at home and has not used any recently. She denies fever, chills, hematochezia, nausea, vomiting, abd pain or any other associated symptoms.  Past Medical History:  Diagnosis Date  . Anemia   . Anxiety   . Depression   . Diabetes ([place])   . Elevated LFTs   . Fibromyalgia   . Hypercholesteremia   . Hypertension   . Hypomagnesemia   . Morbid obesity ([place])   . Sjoegren syndrome [facility])     Patient Active Problem List   Diagnosis Date Noted  . Microcytic anemia 01/21/2015  . Diarrhea 01/21/2015  . GERD 06/24/2009  . NAUSEA AND VOMITING 06/24/2009    Past Surgical History:  Procedure Laterality Date  . ECT TREATMENT    . ESSURE TUBAL LIGATION    . TONSILLECTOMY    . WISDOM TOOTH EXTRACTION       OB History    Gravida Para Term Preterm AB Living   2 2 2          SAB TAB Ectopic Multiple Live Births                   Home Medications    Prior to Admission medications   Medication Sig Start Date End Date Taking? Authorizing Provider  buPROPion (WELLBUTRIN XL) 300 MG 24 hr tablet Take 300 mg by mouth daily.    Historical Provider, MD  hydroxychloroquine (PLAQUENIL) 200 MG tablet Take 400 mg by mouth daily.    Historical Provider, MD  naproxen (NAPROSYN) 500 MG tablet Take 500 mg by mouth 2 (two) times daily as needed.     Historical Provider, MD  polyethylene glycol (MIRALAX / GLYCOLAX) packet Take 17 g by mouth daily. 10/14/16   Drenda Freeze, MD  sitaGLIPtin (JANUVIA) 50 MG tablet Take 50 mg by mouth.    Historical Provider, MD    Family History Family History  Problem Relation Age of Onset  . Multiple sclerosis Mother   . Diabetes Mother     Juvenile  . Psoriasis Father   . Prostate cancer Father   . Diabetes Father   . Lupus Paternal Aunt   . Rheum arthritis Paternal Aunt     Social History Social History  Substance Use Topics  .  Smoking status: Former Smoker    Types: Cigarettes  . Smokeless tobacco: Never Used  . Alcohol use No     Allergies   Penicillins and Sulfonamide derivatives   Review of Systems Review of Systems  Constitutional: Negative for chills and fever.  Gastrointestinal: Positive for constipation. Negative for abdominal pain, blood in stool, nausea and vomiting.  All other systems reviewed and are negative.    Physical Exam Updated Vital Signs BP 134/62 (BP Location: Right Arm)   Pulse 72   Temp 97.9 F (36.6 C) (Oral)   Resp 20   Ht 5\' 7"  (1.702 m)   Wt 239 lb (108.4 kg)   LMP 10/19/2016   SpO2 98%   BMI 37.43 kg/m   Physical Exam  Constitutional: She is oriented to person, place, and time. She appears well-developed and well-nourished. No distress.  HENT:  Head: Normocephalic and atraumatic.  Eyes:  Conjunctivae and EOM are normal.  Neck: Normal range of motion. Neck supple. No tracheal deviation present.  Cardiovascular: Normal rate and regular rhythm.   Pulmonary/Chest: Effort normal. No respiratory distress.  Abdominal: Soft. Bowel sounds are normal. She exhibits no distension. There is no tenderness. There is no rigidity, no rebound, no guarding and no CVA tenderness.  Genitourinary: Rectal exam shows no external hemorrhoid, no internal hemorrhoid, no tenderness, anal tone normal and guaiac negative stool.  Genitourinary Comments: Chaperone present for exam. Positive for stool impaction in the rectal vault with moderate amount of hard stool noted.  Musculoskeletal: Normal range of motion.  Neurological: She is alert and oriented to person, place, and time.  Skin: Skin is warm and dry. Capillary refill takes less than 2 seconds.  Psychiatric: She has a normal mood and affect. Her behavior is normal.  Nursing note and vitals reviewed.    ED Treatments / Results  Labs (all labs ordered are listed, but only abnormal results are displayed) Labs Reviewed - No data to display  EKG  EKG Interpretation None       Radiology Ct Abdomen Pelvis W Contrast  Result Date: 11/04/2016 CLINICAL DATA:  Chronic constipation. No bowel movement for a month. EXAM: CT ABDOMEN AND PELVIS WITH CONTRAST TECHNIQUE: Multidetector CT imaging of the abdomen and pelvis was performed using the standard protocol following bolus administration of intravenous contrast. CONTRAST:  100 mL Isovue-300 intravenous COMPARISON:  12/23/2008 FINDINGS: Lower chest: No acute abnormality. Hepatobiliary: No focal liver abnormality is seen. No gallstones, gallbladder wall thickening, or biliary dilatation. Pancreas: Unremarkable. No pancreatic ductal dilatation or surrounding inflammatory changes. Spleen: Normal in size without focal abnormality. Adrenals/Urinary Tract: Adrenal glands are unremarkable. Kidneys are normal,  without renal calculi, focal lesion, or hydronephrosis. Bladder is unremarkable. Stomach/Bowel: Stomach and small bowel are unremarkable. Normal appendix. Generous volume stool throughout the colon. No discrete lesion. No inflammatory changes. Vascular/Lymphatic: No significant vascular findings are present. No enlarged abdominal or pelvic lymph nodes. Reproductive: Uterus and bilateral adnexa are unremarkable. Other: No ascites.  Small fat containing umbilical hernia. Musculoskeletal: No significant skeletal lesion. IMPRESSION: Generous volume colonic stool without evidence of inflammation, obstruction or focal lesion. No acute findings. Electronically Signed   By: Andreas Newport M.D.   On: 11/04/2016 01:25    Procedures Fecal disimpaction Date/Time: 11/04/2016 1:43 AM Performed by: Ocie Cornfield T Authorized by: Ocie Cornfield T  Consent: Verbal consent obtained. Risks and benefits: risks, benefits and alternatives were discussed Consent given by: patient Patient understanding: patient states understanding of the procedure being performed Patient consent:  the patient's understanding of the procedure matches consent given Procedure consent: procedure consent matches procedure scheduled Patient identity confirmed: verbally with patient Preparation: Patient was prepped and draped in the usual sterile fashion. Local anesthesia used: no  Anesthesia: Local anesthesia used: no  Sedation: Patient sedated: no Patient tolerance: Patient tolerated the procedure well with no immediate complications Comments: Chaperon present for exam, retreated moderate amount of hard stool    (including critical care time)  DIAGNOSTIC STUDIES: Oxygen Saturation is 100% on RA, normal by my interpretation.    COORDINATION OF CARE: 9:11 PM Discussed treatment plan with pt at bedside and pt agreed to plan.  Medications Ordered in ED Medications  sodium phosphate (FLEET) 7-19 GM/118ML enema (  Not  Given 11/04/16 0126)  iopamidol (ISOVUE-300) 61 % injection 100 mL (100 mLs Intravenous Contrast Given 11/04/16 0037)     Initial Impression / Assessment and Plan / ED Course  I have reviewed the triage vital signs and the nursing notes.  Pertinent labs & imaging results that were available during my care of the patient were reviewed by me and considered in my medical decision making (see chart for details).  Clinical Course   Patient presents to the ED with constipation. She is coming from her GI office where she was being seen for constipation with fecal impaction. Patient denies any fever, emesis, nausea, abdominal pain. Rectal exam shows fecal impaction. I was able to perform a digital disimpaction with moderate amount of hard stool retrieved. Patient was given soapsuds enema with small amount of hard stool. Given patient continued constipation and impaction with abnormal bowel movement and no abd ct, dicussed with DR. Mackeun and decided to order a CT. Shows moderate amount of stool throughout the colon but no impaction, obstruction, abscess, perforation or masses were noted. Encourage patient to continue MiraLAX, stool softeners. She will follow-up with her GI doctor tomorrow. They are going to start her on a new medication. Patient is nontoxic appearing. Vital signs are stable. Pt is hemodynamically stable, in NAD, & able to ambulate in the ED. Pain has been managed & has no complaints prior to dc. Pt is comfortable with above plan and is stable for discharge at this time. All questions were answered prior to disposition. Strict return precautions for f/u to the ED were discussed.   Final Clinical Impressions(s) / ED Diagnoses   Final diagnoses:  Constipation, unspecified constipation type  Fecal impaction [facility])    New Prescriptions New Prescriptions   No medications on file   I personally performed the services described in this documentation, which was scribed in my presence. The  recorded information has been reviewed and is accurate.    Doristine Devoid, PA-C 11/04/16 0149    Courteney Julio Alm, MD 11/04/16 [id number]

## 2016-11-03 NOTE — ED Triage Notes (Signed)
States she was at her GI doctors office and he sent her here with constipation and fecal impaction.

## 2016-11-04 LAB — PREGNANCY, URINE: Preg Test, Ur: NEGATIVE

## 2016-11-04 NOTE — Discharge Instructions (Signed)
Your ct does not show any findings of reason for constipation. You need to follow back up with your GI doctor. Return to the ED if you notice any blood in your stool, vomiting or fevers.

## 2017-07-22 ENCOUNTER — Other Ambulatory Visit: Payer: Self-pay | Admitting: Obstetrics and Gynecology

## 2017-07-22 DIAGNOSIS — N63 Unspecified lump in unspecified breast: Secondary | ICD-10-CM

## 2017-07-27 ENCOUNTER — Ambulatory Visit
Admission: RE | Admit: 2017-07-27 | Discharge: 2017-07-27 | Disposition: A | Discharge status: Home / Self Care | Payer: Medicare HMO | Source: Ambulatory Visit | Attending: Obstetrics and Gynecology | Admitting: Obstetrics and Gynecology

## 2017-07-27 ENCOUNTER — Other Ambulatory Visit: Payer: Self-pay | Admitting: Obstetrics and Gynecology

## 2017-07-27 DIAGNOSIS — N6310 Unspecified lump in the right breast, unspecified quadrant: Secondary | ICD-10-CM

## 2017-07-27 DIAGNOSIS — N63 Unspecified lump in unspecified breast: Secondary | ICD-10-CM

## 2017-08-31 ENCOUNTER — Ambulatory Visit: Payer: Medicare HMO | Admitting: Physician Assistant

## 2017-09-09 ENCOUNTER — Telehealth: Payer: Self-pay | Admitting: Internal Medicine

## 2017-09-09 DIAGNOSIS — E1121 Type 2 diabetes mellitus with diabetic nephropathy: Secondary | ICD-10-CM | POA: Insufficient documentation

## 2017-09-09 NOTE — Telephone Encounter (Signed)
Spoke to patient, she has not been seen here in over 2 years.  She has chronic issues with nausea and constipation, states currently taking Zofran and Linzess 290 mg but are not helping much. She states she has seen her PCP and another GI practice. She states she has tried 1 enema on 11/6 and 11/7 with very little results. She said has previous history of fecal impaction. She has appointment with Dr. Henrene Pastor on 12/18. I have offered her an appointment today with APP, she declined this. Next available is on Monday, 11/12 with APP which she accepted. Instructed that if she does not feel better,she would need to go to the ED for evaluation.

## 2017-09-09 NOTE — Telephone Encounter (Signed)
Agree that we cannot medically advise over the telephone.I noted that she has been seen by another GI practice as recently as 3 months ago

## 2017-09-13 ENCOUNTER — Other Ambulatory Visit (INDEPENDENT_AMBULATORY_CARE_PROVIDER_SITE_OTHER): Payer: Medicare HMO

## 2017-09-13 ENCOUNTER — Ambulatory Visit: Payer: Medicare HMO | Admitting: Physician Assistant

## 2017-09-13 ENCOUNTER — Encounter (INDEPENDENT_AMBULATORY_CARE_PROVIDER_SITE_OTHER): Payer: Self-pay

## 2017-09-13 ENCOUNTER — Ambulatory Visit (INDEPENDENT_AMBULATORY_CARE_PROVIDER_SITE_OTHER)
Admission: RE | Admit: 2017-09-13 | Discharge: 2017-09-13 | Disposition: A | Discharge status: Home / Self Care | Payer: Medicare HMO | Source: Ambulatory Visit | Attending: Physician Assistant | Admitting: Physician Assistant

## 2017-09-13 ENCOUNTER — Encounter: Payer: Self-pay | Admitting: Physician Assistant

## 2017-09-13 VITALS — BP 112/72 | HR 78 | Temp 98.3°F | Ht 67.0 in | Wt 251.0 lb

## 2017-09-13 DIAGNOSIS — K5900 Constipation, unspecified: Secondary | ICD-10-CM

## 2017-09-13 DIAGNOSIS — R1084 Generalized abdominal pain: Secondary | ICD-10-CM

## 2017-09-13 DIAGNOSIS — R110 Nausea: Secondary | ICD-10-CM

## 2017-09-13 DIAGNOSIS — K5909 Other constipation: Secondary | ICD-10-CM

## 2017-09-13 DIAGNOSIS — R509 Fever, unspecified: Secondary | ICD-10-CM

## 2017-09-13 LAB — CBC WITH DIFFERENTIAL/PLATELET
BASOS ABS: 0.1 10*3/uL (ref 0.0–0.1)
Basophils Relative: 0.9 % (ref 0.0–3.0)
Eosinophils Absolute: 0.1 10*3/uL (ref 0.0–0.7)
Eosinophils Relative: 0.9 % (ref 0.0–5.0)
HEMATOCRIT: 41.5 % (ref 36.0–46.0)
HEMOGLOBIN: 13.3 g/dL (ref 12.0–15.0)
Lymphocytes Relative: 23.1 % (ref 12.0–46.0)
Lymphs Abs: 2.9 10*3/uL (ref 0.7–4.0)
MCHC: 32 g/dL (ref 30.0–36.0)
MCV: 83.6 fl (ref 78.0–100.0)
MONOS PCT: 6.3 % (ref 3.0–12.0)
Monocytes Absolute: 0.8 10*3/uL (ref 0.1–1.0)
NEUTROS PCT: 68.8 % (ref 43.0–77.0)
Neutro Abs: 8.8 10*3/uL — ABNORMAL HIGH (ref 1.4–7.7)
Platelets: 326 10*3/uL (ref 150.0–400.0)
RBC: 4.97 Mil/uL (ref 3.87–5.11)
RDW: 15 % (ref 11.5–15.5)
WBC: 12.8 10*3/uL — AB (ref 4.0–10.5)

## 2017-09-13 MED ORDER — PROMETHAZINE HCL 25 MG PO TABS: 25.0000 mg | ORAL_TABLET | Freq: Three times a day (TID) | ORAL | 1 refills | Status: DC | PRN

## 2017-09-13 NOTE — Progress Notes (Signed)
Chief Complaint: Nausea, constipation, abdominal pain  HPI:    [name] is a 46 year old female with a past medical history as listed below including fibromyalgia and morbid obesity, who was referred to me by Jefm Petty, MD for a complaint of abdominal pain, constipation and nausea.      Last colonoscopy 01/31/15 by Dr. Henrene Pastor.  Findings of a normal terminal ileum and the ascending colon and otherwise normal exam.  Pathology revealed a tubular adenoma and patient was told to follow-up in 5 years.  At that time patient was experiencing diarrhea.    Review of chart shows that patient has followed with [facility] just regarding a fecal impaction.  She was started on Linzess 290 mcg by them.  Most recent CMP was completed 08/17/17 and was normal.  Sed rate was normal at 13.  CRP normal at 0.14.  CBC showed a minimally elevated white count of 11.  Otherwise normal.    Today, the patient presents to clinic accompanied by her husband and tells me that she feels "horrible".  She has been feeling bad over the past 2 months or so.  Patient tells me that she has Sjogren's which was diagnosed a couple of years ago and had what was thought to be a "flare" about a month ago and was started on Prednisone by her rheumatologist at [facility].  She tells me that since then she has continued with severe fatigue, myalgias, nausea and daily low-grade fevers up to 101 for the past month.  Per rheumatology she is to see infectious disease regarding ongoing fevers.    Patient tells me that she is having generalized abdominal pain, somewhat worse in her epigastrium over the past month or so.  She is also having severe nausea which is unhelped by Zofran 8 mg every 8 hours.  Patient tells me her nausea is so bad that she feels as though she cannot eat.  In fact, she has not ate or drank anything of substance over the past 3 days per her husband.  She has been just "laying around for the past week".  Patient  describes impactions 2 times this year, the last in February or so.  She is "unsure if I am impacted now or not".  Patient describes chronic constipation over this past year and feeling full with associated pain and nausea.  Patient tells me her last "partial bowel movement", was about a week ago.  She has not had a good bowel movement all year long.  Patient is currently still using her Linzess 290 mcg and tells me that at the beginning of this year it gave her "watery brown stool", and never a "normal bowel movement", but now is doing nothing.  Patient has used a mixture of enemas and suppositories over the past few weeks with only small bits of relief.  Patient denies any intense heartburn or reflux symptoms.    Patient denies weight loss, change in diet, change of medications, vomiting or symptoms that awaken her at night.  Past Medical History:  Diagnosis Date  . Anemia   . Anxiety   . Depression   . Diabetes ([place])   . Elevated LFTs   . Fibromyalgia   . Hypercholesteremia   . Hypertension   . Hypomagnesemia   . Morbid obesity ([place])   . Sjoegren syndrome [facility])     Past Surgical History:  Procedure Laterality Date  . ECT TREATMENT    . ESSURE TUBAL LIGATION    .  TONSILLECTOMY    . WISDOM TOOTH EXTRACTION      Current Outpatient Medications  Medication Sig Dispense Refill  . buPROPion (WELLBUTRIN XL) 300 MG 24 hr tablet Take 300 mg by mouth daily.    . DULoxetine (CYMBALTA) 60 MG capsule Take 60 mg daily by mouth.    . hydroxychloroquine (PLAQUENIL) 200 MG tablet Take 400 mg by mouth daily.    . naproxen (NAPROSYN) 500 MG tablet Take 500 mg by mouth 2 (two) times daily as needed.     . polyethylene glycol (MIRALAX / GLYCOLAX) packet Take 17 g by mouth daily. 30 each 0  . sitaGLIPtin (JANUVIA) 50 MG tablet Take 50 mg by mouth.     No current facility-administered medications for this visit.     Allergies as of 09/13/2017 - Review Complete 09/13/2017  Allergen Reaction Noted    . Penicillins    . Sulfonamide derivatives      Family History  Problem Relation Age of Onset  . Multiple sclerosis Mother   . Diabetes Mother        Juvenile  . Psoriasis Father   . Prostate cancer Father   . Diabetes Father   . Lupus Paternal Aunt   . Breast cancer Paternal Aunt   . Rheum arthritis Paternal Aunt     Social History   Socioeconomic History  . Marital status: Married    Spouse name: Not on file  . Number of children: 2  . Years of education: Not on file  . Highest education level: Not on file  Social Needs  . Financial resource strain: Not on file  . Food insecurity - worry: Not on file  . Food insecurity - inability: Not on file  . Transportation needs - medical: Not on file  . Transportation needs - non-medical: Not on file  Occupational History  . Occupation: disabled  Tobacco Use  . Smoking status: Former Smoker    Types: Cigarettes  . Smokeless tobacco: Never Used  Substance and Sexual Activity  . Alcohol use: No    Alcohol/week: 0.0 oz  . Drug use: No  . Sexual activity: Not on file  Other Topics Concern  . Not on file  Social History Narrative  . Not on file    Review of Systems:    Constitutional: No weight loss, fever or chills Skin: No rash  Cardiovascular: No chest pain  Respiratory: No SOB Gastrointestinal: See HPI and otherwise negative Genitourinary: No dysuria Neurological: No headache Musculoskeletal: No new muscle or joint pain Hematologic: No bleeding  Psychiatric: No history of depression or anxiety   Physical Exam:  Vital signs: BP 112/72   Pulse 78   Temp 98.3 F (36.8 C) (Oral)   Ht 5\' 7"  (1.702 m)   Wt 251 lb (113.9 kg)   BMI 39.31 kg/m    Constitutional:   Emotional Caucasian female appears to be in mild distress, Well developed, Well nourished, alert and cooperative Head:  Normocephalic and atraumatic. Eyes:   PEERL, EOMI. No icterus. Conjunctiva pink. Ears:  Normal auditory acuity. Neck:   Supple Throat: Oral cavity and pharynx without inflammation, swelling or lesion.  Respiratory: Respirations even and unlabored. Lungs clear to auscultation bilaterally.   No wheezes, crackles, or rhonchi.  Cardiovascular: Normal S1, S2. No MRG. Regular rate and rhythm. No peripheral edema, cyanosis or pallor.  Gastrointestinal:  Soft, nondistended, moderate abdominal pain, worse in the epigastrum, No rebound or guarding. Normal bowel sounds. No appreciable masses or hepatomegaly.  Rectal:  External exam: multiple hemorrhoids and tags, some ttp; internal exam: no stool in the rectal vault, fullness of the tissue, no mass Msk:  Symmetrical without gross deformities. Without edema, no deformity or joint abnormality.  Neurologic:  Alert and  oriented x4;  grossly normal neurologically.  Skin:   Dry and intact without significant lesions or rashes. Psychiatric: Demonstrates good judgement and reason without abnormal affect or behaviors. Anxious, tearful  See HPI for recent labs.  Assessment: 1.  Nausea: likely worse with constipation 2.  Constipation:worse over the past yr with 2 separate impactions, Linzess 290 mcg qd no longer helping; consider decreased appetite as cause for worsening recently vs sjogrens vs other 3.  Abdominal pain:Likely with constipation above, consider also gastritis, etc 4. Fevers: unknown etiology-has planned consult with ID  Plan: 1.Ordered 2 view abdominal xray-if moderate stool would recommend sending moviprep to the pharmacy and Reglan 10mg  #2 tabs to be taken with prep 2. Continue Linzess 290 mcg for now, but if stool in colon, will need to find new solution going forward, i.e. Amitiza or other 3. If xray normal, will consider EGD due to severe nausea and abdominal pain 4. Recommend patient use her Omeprazole 40mg  scheduled qd for now 5. Prescribed Phenergan 25 mg q8h #100 with a refill 6. Due to decreased oral intake, concerned for dehydration- ordered CBC/CMP  today 7. Patient to follow in clinic per recommendations after testing above  Ellouise Newer, PA-C [facility] 09/13/2017, 3:18 PM  Cc: Jefm Petty, MD

## 2017-09-13 NOTE — Patient Instructions (Addendum)
Please go to the basement level to have your labs drawn and the radiology department for an abdominal x-ray.    Continue the Linzess medication.  We have sent the following medications to your pharmacy for you to pick up at your convenience: [facility] [address]  1. Phenergan 25 mg- Take 1 tab as needed every 8 hours.  If you are age 45 or younger, your body mass index should be between 19-25. Your Body mass index is 39.31 kg/m. If this is out of the aformentioned range listed, please consider follow up with your Primary Care Provider.

## 2017-09-14 ENCOUNTER — Other Ambulatory Visit: Payer: Self-pay

## 2017-09-14 DIAGNOSIS — R1084 Generalized abdominal pain: Secondary | ICD-10-CM

## 2017-09-14 NOTE — Progress Notes (Signed)
Assessment and plans reviewed  

## 2017-09-20 NOTE — Progress Notes (Signed)
Please call and check in with patient regarding ongoing symptoms. Thanks-JLL

## 2017-09-21 ENCOUNTER — Inpatient Hospital Stay: Admission: RE | Admit: 2017-09-21 | Payer: Medicare HMO | Source: Ambulatory Visit

## 2017-09-27 ENCOUNTER — Other Ambulatory Visit (INDEPENDENT_AMBULATORY_CARE_PROVIDER_SITE_OTHER): Payer: Medicare HMO

## 2017-09-27 ENCOUNTER — Telehealth: Payer: Self-pay | Admitting: Physician Assistant

## 2017-09-27 DIAGNOSIS — R110 Nausea: Secondary | ICD-10-CM | POA: Diagnosis not present

## 2017-09-27 LAB — COMPREHENSIVE METABOLIC PANEL
ALBUMIN: 4.1 g/dL (ref 3.5–5.2)
ALK PHOS: 66 U/L (ref 39–117)
ALT: 18 U/L (ref 0–35)
AST: 17 U/L (ref 0–37)
BUN: 16 mg/dL (ref 6–23)
CALCIUM: 9.3 mg/dL (ref 8.4–10.5)
CO2: 30 mEq/L (ref 19–32)
Chloride: 101 mEq/L (ref 96–112)
Creatinine, Ser: 0.74 mg/dL (ref 0.40–1.20)
GFR: 89.93 mL/min (ref >60.00)
Glucose, Bld: 71 mg/dL (ref 70–99)
POTASSIUM: 4.1 meq/L (ref 3.5–5.1)
SODIUM: 137 meq/L (ref 135–145)
TOTAL PROTEIN: 7.7 g/dL (ref 6.0–8.3)
Total Bilirubin: 0.3 mg/dL (ref 0.2–1.2)

## 2017-09-28 ENCOUNTER — Inpatient Hospital Stay: Admission: RE | Admit: 2017-09-28 | Payer: Medicare HMO | Source: Ambulatory Visit

## 2017-09-28 NOTE — Telephone Encounter (Signed)
The pt is going to try and drink the contrast and was also advised of the normal lab.  She will call back if she is unable to drink the contrast

## 2017-09-28 NOTE — Telephone Encounter (Signed)
  Left message on machine to call back   Levin Erp, [place]  Jeoffrey Massed, RN        CMP is normal. JLL

## 2017-10-19 ENCOUNTER — Encounter: Payer: Self-pay | Admitting: Internal Medicine

## 2017-10-19 ENCOUNTER — Ambulatory Visit: Payer: Medicare HMO | Admitting: Internal Medicine

## 2017-10-19 VITALS — BP 116/60 | HR 78 | Ht 67.0 in | Wt 255.0 lb

## 2017-10-19 DIAGNOSIS — R809 Proteinuria, unspecified: Secondary | ICD-10-CM

## 2017-10-19 DIAGNOSIS — K5900 Constipation, unspecified: Secondary | ICD-10-CM | POA: Diagnosis not present

## 2017-10-19 DIAGNOSIS — R112 Nausea with vomiting, unspecified: Secondary | ICD-10-CM | POA: Diagnosis not present

## 2017-10-19 DIAGNOSIS — E1129 Type 2 diabetes mellitus with other diabetic kidney complication: Secondary | ICD-10-CM

## 2017-10-19 MED ORDER — OMEPRAZOLE 40 MG PO CPDR: 40.0000 mg | DELAYED_RELEASE_CAPSULE | Freq: Every day | ORAL | 3 refills | Status: AC

## 2017-10-19 NOTE — Progress Notes (Signed)
HISTORY OF PRESENT ILLNESS:  [name] is a 46 y.o. female with multiple medical problems including morbid obesity, hypertension, hyperlipidemia, anxiety/depression, diabetes mellitus, fatty liver, fibromyalgia, Sjogren syndrome who presents today for GI follow-up regarding problems with chronic constipation and chronic nausea. The patient receives her care at multiple institutions. I last saw the patient in July 2015 regarding elevated hepatic transaminases, GERD, and chronic pain syndrome. Repeat abdominal ultrasound revealed fatty liver. Supplemental laboratory testing was unrevealing. She was to follow-up in 4-6 weeks but did not. She did undergo an upper endoscopy in August 2010 regarding nausea and vomiting. She was found to have distal esophagitis and erythema of the duodenal bulb. Testing for Helicobacter pylori was negative. She also underwent complete colonoscopy regarding chronic diarrhea in March 2016. The ileum was normal. The colon was normal except for diminutive polyp which was removed and found to be tubular adenoma. Random colon biopsies were normal. Repeat colonoscopy in 5 years recommended. The patient was seen by the GI physician assistant 09/13/2017 regarding nausea, constipation, abdominal pain, and fevers of unknown origin. Patient tells me that she underwent evaluation with infectious disease specialist at [place]. She tells me that her fevers were not felt to be infectious. Her rheumatologic condition is followed at [facility]. For her constipation she is taking linzess 290 g on demand. This works. Next, for chronic nausea without vomiting she takes Phenergan. It once or twice per day. No dysphagia or indigestion. No weight loss. She does take omeprazole 20 mg daily. Blood work from her last visit revealed normal comprehensive metabolic panel. CBC was normal except for white blood cell count of 12.8. Plain films of the abdomen were unremarkable. CT scan of the abdomen and pelvis with  contrast January 2018 to evaluate chronic constipation revealed increased colonic stool but no other abnormalities. Since her last visit in this office the patient shows me on her phone that she underwent contrast-enhanced CT scan of the chest, abdomen, and pelvis. This to evaluate fevers. The examinations were unremarkable. Patient is somewhat despondent over her medical problems. She is tearful.  REVIEW OF SYSTEMS:  All non-GI ROS negative unless otherwise stated in the history of present illness  except for fever and fatigue  Past Medical History:  Diagnosis Date  . Anemia   . Anxiety   . Depression   . Diabetes ([place])   . Elevated LFTs   . Fibromyalgia   . Hypercholesteremia   . Hypertension   . Hypomagnesemia   . Morbid obesity ([place])   . Sjoegren syndrome [facility]     Past Surgical History:  Procedure Laterality Date  . ECT TREATMENT    . ESSURE TUBAL LIGATION    . TONSILLECTOMY    . WISDOM TOOTH EXTRACTION      Social History [name]  reports that she has quit smoking. Her smoking use included cigarettes. she has never used smokeless tobacco. She reports that she does not drink alcohol or use drugs.  family history includes Breast cancer in her paternal aunt; Diabetes in her father and mother; Lupus in her paternal aunt; Multiple sclerosis in her mother; Prostate cancer in her father; Psoriasis in her father; Rheum arthritis in her paternal aunt.  Allergies  Allergen Reactions  . Penicillins     REACTION: hives  . Sulfonamide Derivatives     REACTION: hives       PHYSICAL EXAMINATION: Vital signs: BP 116/60   Pulse 78   Ht 5\' 7"  (1.702 m)   Wt  255 lb (115.7 kg)   LMP 10/16/2017   BMI 39.94 kg/m   Constitutional: Obese, unhealthy appearing, no acute distress Psychiatric: alert and oriented x3, cooperative. Depressed-appearing Eyes: extraocular movements intact, anicteric, conjunctiva pink Mouth: oral pharynx moist, no lesions Neck: supple no  lymphadenopathy Cardiovascular: heart regular rate and rhythm, no murmur Lungs: clear to auscultation bilaterally Abdomen: soft, obese, nontender, nondistended, no obvious ascites, no peritoneal signs, normal bowel sounds, no organomegaly Rectal: Omitted Extremities: no clubbing, cyanosis, or lower extremity edema bilaterally Skin: no lesions on visible extremities Neuro: No focal deficits. Cranial nerves intact  ASSESSMENT:  #1. Chronic constipation. Colonoscopy March 2016 as described. Responds to on demand linzess #2. History of adenomatous colon polyp March 2016 #3. Nausea without vomiting. Rule out gastroparesis. Rule out GI mucosal lesion #4. Multiple significant medical problems   PLAN:  #1. Reflux precautions with attention to weight loss #2. Increase omeprazole from 20 mg daily to 40 mg daily #3. Continue Phenergan as needed for severe nausea. Zofran does not work she tells me. #4. Continue linzess on demand for constipation #5. Schedule solid-phase gastric Epting scan to rule out gastroparesis #6. Schedule upper endoscopy to rule out mucosal lesion to explain symptoms.The nature of the procedure, as well as the risks, benefits, and alternatives were carefully and thoroughly reviewed with the patient. Ample time for discussion and questions allowed. The patient understood, was satisfied, and agreed to proceed. #7. Continue her continuity care with her specialists including rheumatologist, internist, and infectious diseases #8. Routine surveillance colonoscopy around March 2021  40 minutes spent face-to-face with the patient. Greater than 50% a time use for counseling regarding her chronic problems with nausea and constipation

## 2017-10-19 NOTE — Patient Instructions (Signed)
We have sent the following medications to your pharmacy for you to pick up at your convenience: Omeprazole 40mg   Continue Linzess  You have been scheduled for an endoscopy. Please follow written instructions given to you at your visit today. If you use inhalers (even only as needed), please bring them with you on the day of your procedure. Your physician has requested that you go to www.startemmi.com and enter the access code given to you at your visit today. This web site gives a general overview about your procedure. However, you should still follow specific instructions given to you by our office regarding your preparation for the procedure.    You have been scheduled for a gastric emptying scan at [facility] on 10/27/2017 at 9:30am. Please arrive at least 15 minutes prior to your appointment for registration. Please make certain not to have anything to eat or drink after midnight the night before your test. Hold all stomach medications (ex: Zofran, phenergan, Reglan) 8  hours prior to your test. If you need to reschedule your appointment, please contact radiology scheduling at [phone]. _____________________________________________________________________ A gastric-emptying study measures how long it takes for food to move through your stomach. There are several ways to measure stomach emptying. In the most common test, you eat food that contains a small amount of radioactive material. A scanner that detects the movement of the radioactive material is placed over your abdomen to monitor the rate at which food leaves your stomach. This test normally takes about 4 hours to complete. _____________________________________________________________________

## 2017-10-27 ENCOUNTER — Encounter (HOSPITAL_COMMUNITY)
Admission: RE | Admit: 2017-10-27 | Discharge: 2017-10-27 | Disposition: A | Discharge status: Home / Self Care | Payer: Medicare HMO | Source: Ambulatory Visit | Attending: Internal Medicine | Admitting: Internal Medicine

## 2017-10-27 DIAGNOSIS — K5900 Constipation, unspecified: Secondary | ICD-10-CM | POA: Diagnosis present

## 2017-10-27 DIAGNOSIS — R112 Nausea with vomiting, unspecified: Secondary | ICD-10-CM

## 2017-10-27 MED ORDER — TECHNETIUM TC 99M SULFUR COLLOID
1.8000 | Freq: Once | INTRAVENOUS | Status: AC | PRN
  Administered 2017-10-27: 1.8 via INTRAVENOUS

## 2017-10-29 ENCOUNTER — Telehealth: Payer: Self-pay | Admitting: Internal Medicine

## 2017-10-29 NOTE — Telephone Encounter (Signed)
Spoke to patient, she states she cannot eat or drink anything without vomiting. She has taken her phenergan. I let her know that if she cannot eat or drink then she needs to go to the ED for evaluation. Patient wants to know what else she can do until her EGD on 12/07/17. Again, advised her per Dr. Blanch Media recommendations to eat smaller meals, continue medication and exercise. I also let her know I would put her on a move up list for the procedure. Patient aware that Dr. Henrene Pastor is not in the office, but will be back on Monday, 12/31.

## 2017-10-31 NOTE — Telephone Encounter (Signed)
Noted.  Nothing to add. 

## 2017-11-12 ENCOUNTER — Encounter: Payer: Self-pay | Admitting: Internal Medicine

## 2017-11-13 ENCOUNTER — Other Ambulatory Visit: Payer: Self-pay | Admitting: Physician Assistant

## 2017-11-18 ENCOUNTER — Encounter: Payer: Self-pay | Admitting: Internal Medicine

## 2017-11-18 ENCOUNTER — Other Ambulatory Visit: Payer: Self-pay

## 2017-11-18 ENCOUNTER — Ambulatory Visit (AMBULATORY_SURGERY_CENTER): Payer: Medicare HMO | Admitting: Internal Medicine

## 2017-11-18 VITALS — BP 135/76 | HR 71 | Temp 94.2°F | Resp 16 | Ht 67.0 in | Wt 255.0 lb

## 2017-11-18 DIAGNOSIS — K222 Esophageal obstruction: Secondary | ICD-10-CM

## 2017-11-18 DIAGNOSIS — K317 Polyp of stomach and duodenum: Secondary | ICD-10-CM

## 2017-11-18 DIAGNOSIS — K3184 Gastroparesis: Secondary | ICD-10-CM | POA: Diagnosis not present

## 2017-11-18 DIAGNOSIS — R112 Nausea with vomiting, unspecified: Secondary | ICD-10-CM | POA: Diagnosis present

## 2017-11-18 MED ORDER — SODIUM CHLORIDE 0.9 % IV SOLN: 500.0000 mL | INTRAVENOUS | Status: DC

## 2017-11-18 NOTE — Progress Notes (Signed)
Patient consents to having student present to observe procedure.

## 2017-11-18 NOTE — Op Note (Signed)
[place] Patient Name: [name] Procedure Date: 11/18/2017 7:56 AM MRN: [id number] Endoscopist: Docia Chuck. Henrene Pastor , MD Age: 47 Referring MD:  Date of Birth: Jan 04, 1971 Gender: Female Account #: [id number] Procedure:                Upper GI endoscopy, with biopsy Indications:              Nausea with vomiting Medicines:                Monitored Anesthesia Care Procedure:                Pre-Anesthesia Assessment:                           - Prior to the procedure, a History and Physical                            was performed, and patient medications and                            allergies were reviewed. The patient's tolerance of                            previous anesthesia was also reviewed. The risks                            and benefits of the procedure and the sedation                            options and risks were discussed with the patient.                            All questions were answered, and informed consent                            was obtained. Prior Anticoagulants: The patient has                            taken no previous anticoagulant or antiplatelet                            agents. ASA Grade Assessment: III - A patient with                            severe systemic disease. After reviewing the risks                            and benefits, the patient was deemed in                            satisfactory condition to undergo the procedure.                           After obtaining informed consent, the endoscope was  passed under direct vision. Throughout the                            procedure, the patient's blood pressure, pulse, and                            oxygen saturations were monitored continuously. The                            Model GIF-[id number]) scope was introduced                            through the mouth, and advanced to the second part                            of duodenum. The  upper GI endoscopy was                            accomplished without difficulty. The patient                            tolerated the procedure well. Scope In: Scope Out: Findings:                 A mild Schatzki ring (acquired) was found at the                            gastroesophageal junction. The esophagus was                            otherwise normal.                           A small amount of food (residue) was found in the                            gastric fundus, consistent with gastroparesis.                           A few small pedunculated polyps were found in the                            gastric body. Biopsies were taken with a cold                            forceps for histology. The stomach was otherwise                            normal.                           The examined duodenum was normal.                           The cardia and gastric fundus were normal on  retroflexion. Complications:            No immediate complications. Estimated Blood Loss:     Estimated blood loss: none. Impression:               - Mild Schatzki ring.                           - A small amount of food (residue) in the stomach,                            consistent with documented gastroparesis.                           - A few gastric polyps. Biopsied.                           - Normal examined duodenum. Recommendation:           1. Small meals                           2. Good diabetic control                           3. Continue omeprazole at 40 mg daily dose                           4. Phenergan as needed for nausea                           5. Exercise and weight loss                           6. Await pathology results.                           7. Routine GI follow-up 3 months John N. Henrene Pastor, MD 11/18/2017 8:19:02 AM This report has been signed electronically.

## 2017-11-18 NOTE — Patient Instructions (Signed)
YOU HAD AN ENDOSCOPIC PROCEDURE TODAY AT THE [facility]:   Refer to the procedure report that was given to you for any specific questions about what was found during the examination.  If the procedure report does not answer your questions, please call your gastroenterologist to clarify.  If you requested that your care partner not be given the details of your procedure findings, then the procedure report has been included in a sealed envelope for you to review at your convenience later.  YOU SHOULD EXPECT: Some feelings of bloating in the abdomen. Passage of more gas than usual.  Walking can help get rid of the air that was put into your GI tract during the procedure and reduce the bloating. If you had a lower endoscopy (such as a colonoscopy or flexible sigmoidoscopy) you may notice spotting of blood in your stool or on the toilet paper. If you underwent a bowel prep for your procedure, you may not have a normal bowel movement for a few days.  Please Note:  You might notice some irritation and congestion in your nose or some drainage.  This is from the oxygen used during your procedure.  There is no need for concern and it should clear up in a day or so.  SYMPTOMS TO REPORT IMMEDIATELY:   Following upper endoscopy (EGD)  Vomiting of blood or coffee ground material  New chest pain or pain under the shoulder blades  Painful or persistently difficult swallowing  New shortness of breath  Fever of 100F or higher  Black, tarry-looking stools  For urgent or emergent issues, a gastroenterologist can be reached at any hour by calling [phone].   DIET:  We do recommend a small meal at first, but then you may proceed to your regular diet.  Drink plenty of fluids but you should avoid alcoholic beverages for 24 hours.  ACTIVITY:  You should plan to take it easy for the rest of today and you should NOT DRIVE or use heavy machinery until tomorrow (because of the sedation medicines used  during the test).    FOLLOW UP: Our staff will call the number listed on your records the next business day following your procedure to check on you and address any questions or concerns that you may have regarding the information given to you following your procedure. If we do not reach you, we will leave a message.  However, if you are feeling well and you are not experiencing any problems, there is no need to return our call.  We will assume that you have returned to your regular daily activities without incident.  If any biopsies were taken you will be contacted by phone or by letter within the next 1-3 weeks.  Please call us at [phone] if you have not heard about the biopsies in 3 weeks.    SIGNATURES/CONFIDENTIALITY: You and/or your care partner have signed paperwork which will be entered into your electronic medical record.  These signatures attest to the fact that that the information above on your After Visit Summary has been reviewed and is understood.  Full responsibility of the confidentiality of this discharge information lies with you and/or your care-partner. 

## 2017-11-18 NOTE — Progress Notes (Signed)
Report to PACU, RN, vss, BBS= Clear.  

## 2017-11-18 NOTE — Progress Notes (Signed)
Called to room to assist during endoscopic procedure.  Patient ID and intended procedure confirmed with present staff. Received instructions for my participation in the procedure from the performing physician.  

## 2017-11-19 ENCOUNTER — Telehealth: Payer: Self-pay | Admitting: *Deleted

## 2017-11-19 NOTE — Telephone Encounter (Signed)
  Follow up Call-  Call back number 11/18/2017  Post procedure Call Back phone  # [phone]  Permission to leave phone message Yes  Some recent data might be hidden     Patient questions:  Do you have a fever, pain , or abdominal swelling? No. Pain Score  0 *  Have you tolerated food without any problems? Yes.    Have you been able to return to your normal activities? Yes.    Do you have any questions about your discharge instructions: Diet   No. Medications  No. Follow up visit  No.  Do you have questions or concerns about your Care? No.  Actions: * If pain score is 4 or above: No action needed, pain <4.

## 2017-11-23 ENCOUNTER — Encounter: Payer: Self-pay | Admitting: Internal Medicine

## 2017-11-26 ENCOUNTER — Telehealth: Payer: Self-pay | Admitting: Internal Medicine

## 2017-11-26 MED ORDER — ONDANSETRON 8 MG PO TBDP: 8.0000 mg | ORAL_TABLET | Freq: Four times a day (QID) | ORAL | 1 refills | Status: DC | PRN

## 2017-11-26 NOTE — Telephone Encounter (Signed)
Patient states her nausea medication phenergan does not seem to help her and wants to know if she can get a stronger dose or something else called in.

## 2017-11-26 NOTE — Telephone Encounter (Signed)
Pt states the phenergan is not helping requesting something different for nausea. Zofran script sent to pharmacy per Alonza Bogus PA.

## 2017-12-07 ENCOUNTER — Encounter: Payer: Medicare HMO | Admitting: Internal Medicine

## 2017-12-15 ENCOUNTER — Other Ambulatory Visit: Payer: Self-pay | Admitting: Physician Assistant

## 2017-12-31 IMAGING — MG 2D DIGITAL DIAGNOSTIC BILATERAL MAMMOGRAM WITH CAD AND ADJUNCT T
8 of 17 series · 8 of 40 positions shown · non-contrast
Comparison: Previous exam(s).

CLINICAL DATA: 46-year-old female presenting for annual bilateral
mammogram. The patient's husband palpated a mass in the inferior
right breast along the inframammary fold.

EXAM:
2D DIGITAL DIAGNOSTIC BILATERAL MAMMOGRAM WITH CAD AND ADJUNCT TOMO
ULTRASOUND RIGHT BREAST

[L CC synth-2D]
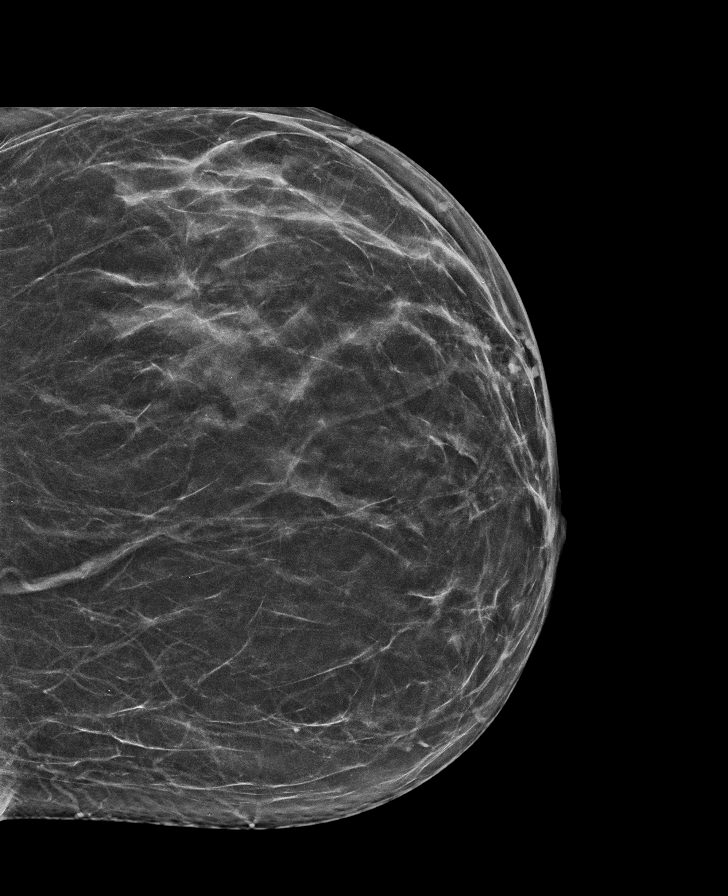

[R MLO (1 of 2)]
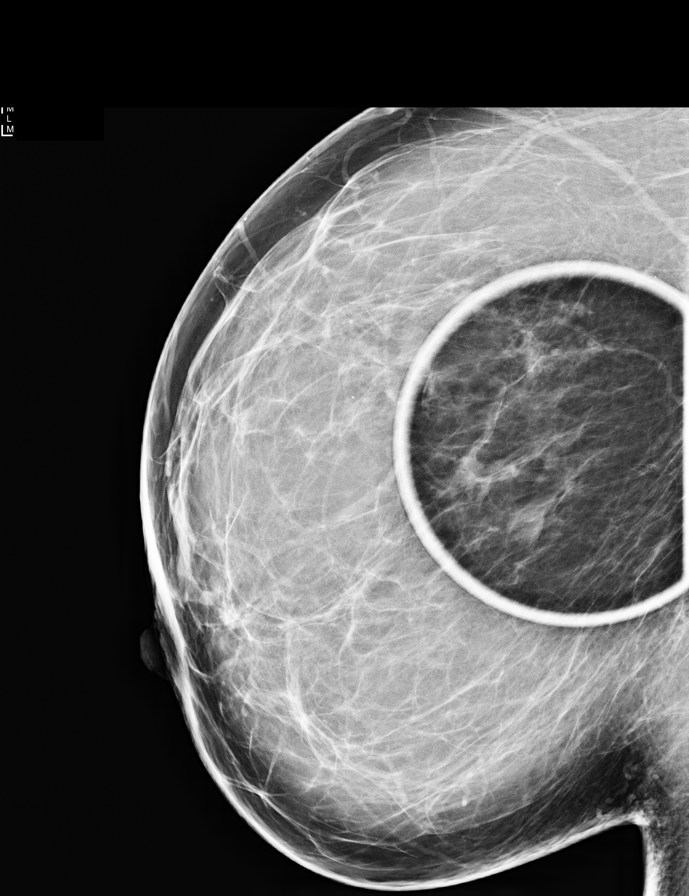

[L MLO synth-2D]
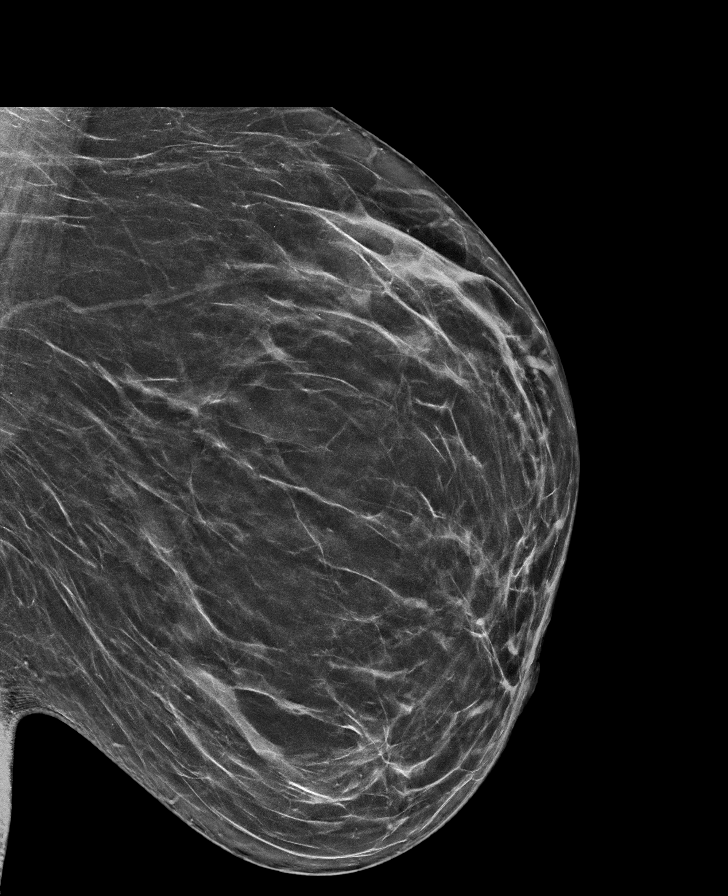

[R MLO synth-2D (1 of 2)]
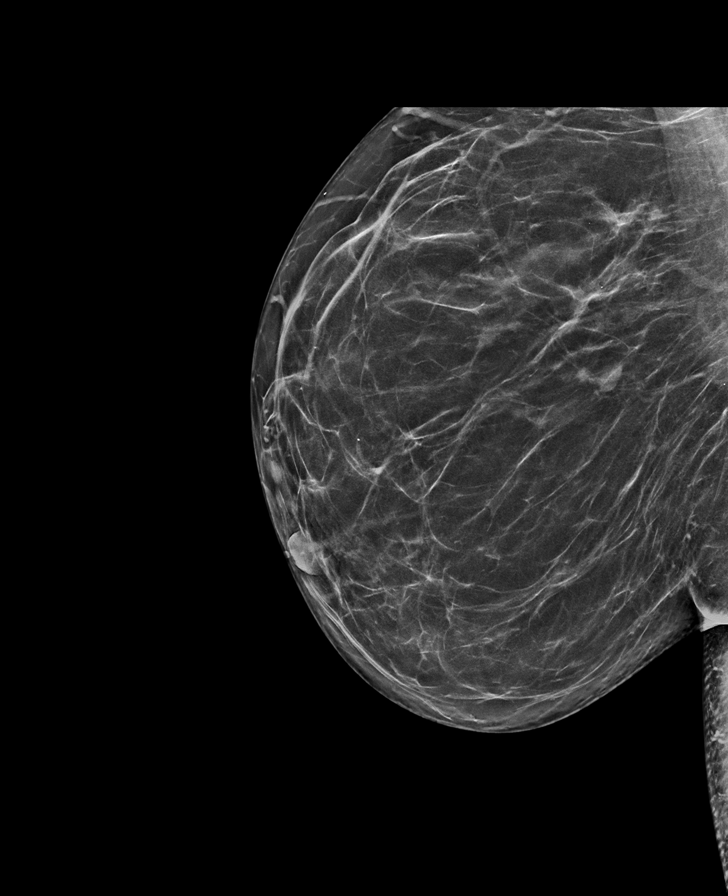

[R MLO synth-2D (2 of 2)]
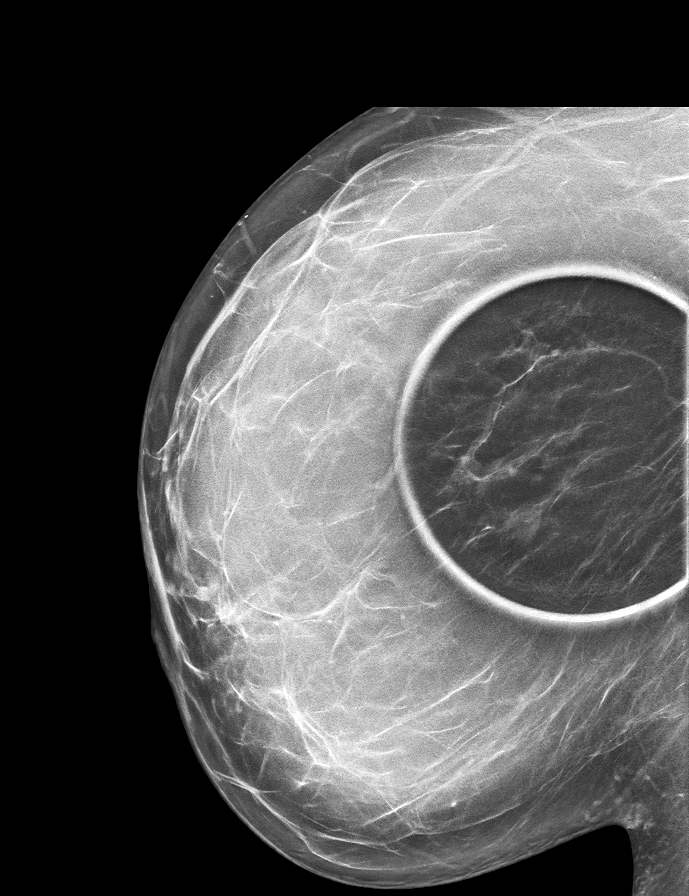

[R CC]
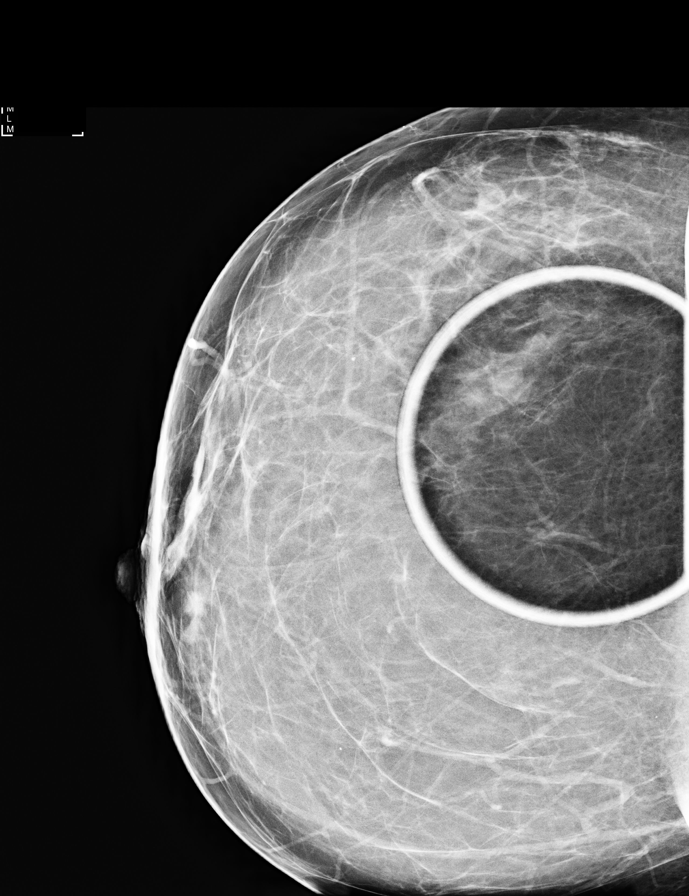

[L MLO]
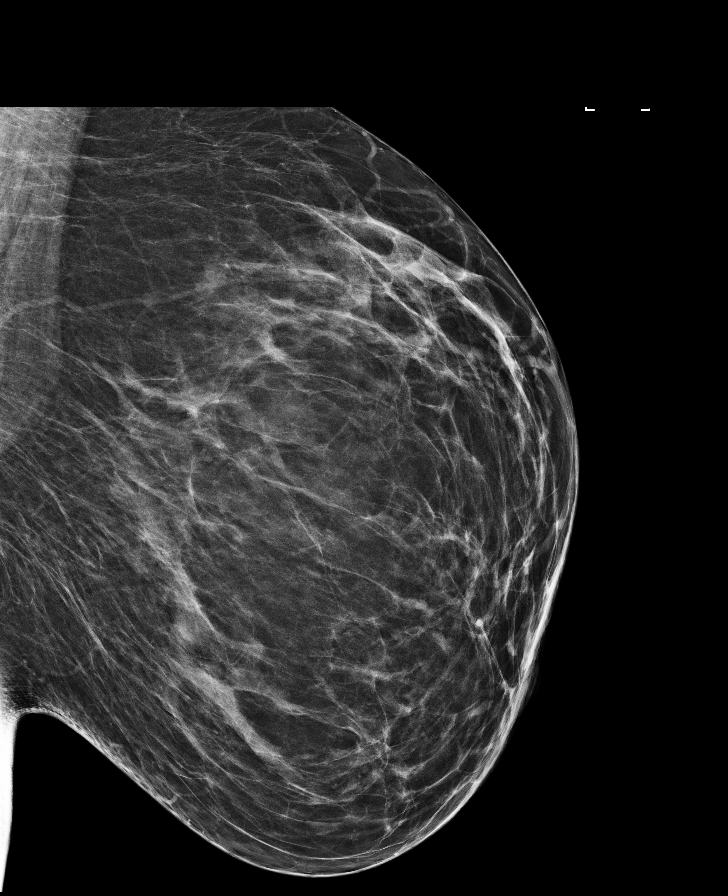

[R MLO (2 of 2)]
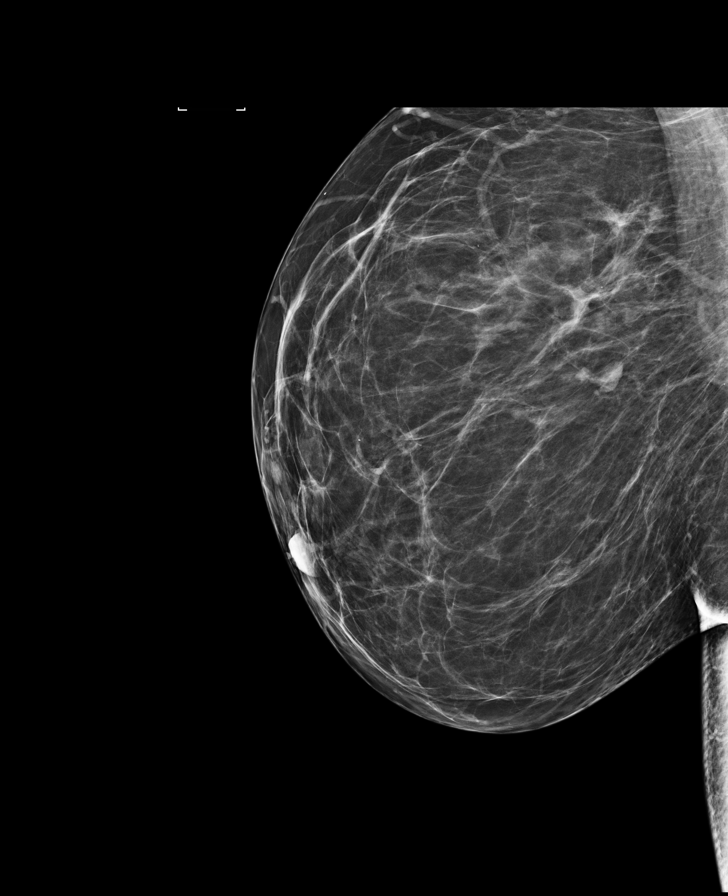

[8 of 40 positions shown; findings below may reference images not displayed]

ACR Breast Density Category b: There are scattered areas of
fibroglandular density.
FINDINGS: An asymmetry in the central right breast at posterior depth on the
MLO projection resolves into fibroglandular tissue on additional
views. A small circumscribed mass is seen just anterior to the
asymmetry. No mammographic findings are seen along the right
intramammary fold. Further evaluation with ultrasound was performed.

No suspicious masses or calcifications are identified on the left.

Mammographic images were processed with CAD.

On physical exam, I palpate no focal abnormalities along the
patient's inframammary fold.

Targeted ultrasound is performed, showing no significant findings
along the inframammary fold from the [DATE] to [DATE] position to
correlate with the palpable abnormality.

A circumscribed hypoechoic masses identified at the 9 o'clock
position 4 cm from the nipple. It measures 0.4 x 0.3 x 0.4 cm. There
is no significant internal vascularity. This likely correlates with
the circumscribed mass identified on mammogram and likely represents
a complicated cyst versus cyst cluster.
IMPRESSION: 1. Probably benign right breast mass, possibly representing a
complicated cyst versus cyst cluster. Recommendation is for
six-month sonographic and mammographic follow-up.
2. No mammographic or sonographic findings to correlate with the
patient's right breast palpable abnormality.
3. No mammographic evidence of malignancy on the left.

RECOMMENDATION:
1. Diagnostic right breast mammogram and ultrasound in 6 months.
2. Clinical follow-up recommended for the palpable area of concern
in the right breast. Any further workup should be based on clinical
grounds.

I have discussed the findings and recommendations with the patient.
Results were also provided in writing at the conclusion of the
visit. If applicable, a reminder letter will be sent to the patient
regarding the next appointment.

BI-RADS CATEGORY  3: Probably benign.

## 2018-01-26 ENCOUNTER — Ambulatory Visit: Admission: RE | Admit: 2018-01-26 | Payer: Medicare HMO | Source: Ambulatory Visit

## 2018-01-26 ENCOUNTER — Ambulatory Visit
Admission: RE | Admit: 2018-01-26 | Discharge: 2018-01-26 | Disposition: A | Discharge status: Home / Self Care | Payer: Medicare HMO | Source: Ambulatory Visit | Attending: Obstetrics and Gynecology | Admitting: Obstetrics and Gynecology

## 2018-01-26 DIAGNOSIS — N6310 Unspecified lump in the right breast, unspecified quadrant: Secondary | ICD-10-CM

## 2018-02-04 DIAGNOSIS — R05 Cough: Secondary | ICD-10-CM | POA: Diagnosis not present

## 2018-02-04 DIAGNOSIS — M3500 Sicca syndrome, unspecified: Secondary | ICD-10-CM | POA: Diagnosis not present

## 2018-02-15 ENCOUNTER — Ambulatory Visit: Payer: Medicare HMO | Admitting: Internal Medicine

## 2018-02-16 DIAGNOSIS — M545 Low back pain: Secondary | ICD-10-CM | POA: Diagnosis not present

## 2018-03-02 DIAGNOSIS — R509 Fever, unspecified: Secondary | ICD-10-CM | POA: Diagnosis not present

## 2018-03-02 DIAGNOSIS — M3500 Sicca syndrome, unspecified: Secondary | ICD-10-CM | POA: Diagnosis not present

## 2018-03-09 ENCOUNTER — Telehealth: Payer: Self-pay | Admitting: Internal Medicine

## 2018-03-09 NOTE — Telephone Encounter (Signed)
Pt needs to speak with you regarding her health status, she has gastroparesis and other conditions and needs some advise with medications.

## 2018-03-10 NOTE — Telephone Encounter (Signed)
Pt returning phone call to Vaughan Basta best call back [phone].

## 2018-03-10 NOTE — Telephone Encounter (Signed)
Left message for pt to call back  °

## 2018-03-11 DIAGNOSIS — H6121 Impacted cerumen, right ear: Secondary | ICD-10-CM | POA: Diagnosis not present

## 2018-03-11 DIAGNOSIS — R05 Cough: Secondary | ICD-10-CM | POA: Diagnosis not present

## 2018-03-11 DIAGNOSIS — R400 Somnolence: Secondary | ICD-10-CM | POA: Diagnosis not present

## 2018-03-14 NOTE — Telephone Encounter (Signed)
Left message for pt to call back  °

## 2018-03-16 NOTE — Telephone Encounter (Signed)
Pt did not call back

## 2018-03-18 DIAGNOSIS — E2740 Unspecified adrenocortical insufficiency: Secondary | ICD-10-CM | POA: Diagnosis not present

## 2018-03-18 DIAGNOSIS — E119 Type 2 diabetes mellitus without complications: Secondary | ICD-10-CM | POA: Diagnosis not present

## 2018-03-18 DIAGNOSIS — E282 Polycystic ovarian syndrome: Secondary | ICD-10-CM | POA: Diagnosis not present

## 2018-03-18 DIAGNOSIS — I10 Essential (primary) hypertension: Secondary | ICD-10-CM | POA: Diagnosis not present

## 2018-03-30 DIAGNOSIS — G8929 Other chronic pain: Secondary | ICD-10-CM | POA: Diagnosis not present

## 2018-03-30 DIAGNOSIS — R69 Illness, unspecified: Secondary | ICD-10-CM | POA: Diagnosis not present

## 2018-03-30 DIAGNOSIS — E785 Hyperlipidemia, unspecified: Secondary | ICD-10-CM | POA: Diagnosis not present

## 2018-04-11 DIAGNOSIS — Z0000 Encounter for general adult medical examination without abnormal findings: Secondary | ICD-10-CM | POA: Diagnosis not present

## 2018-04-11 DIAGNOSIS — M25542 Pain in joints of left hand: Secondary | ICD-10-CM | POA: Diagnosis not present

## 2018-04-11 DIAGNOSIS — E7849 Other hyperlipidemia: Secondary | ICD-10-CM | POA: Diagnosis not present

## 2018-04-11 DIAGNOSIS — Z13220 Encounter for screening for lipoid disorders: Secondary | ICD-10-CM | POA: Diagnosis not present

## 2018-04-11 DIAGNOSIS — Z130 Encounter for screening for diseases of the blood and blood-forming organs and certain disorders involving the immune mechanism: Secondary | ICD-10-CM | POA: Diagnosis not present

## 2018-04-11 DIAGNOSIS — M3500 Sicca syndrome, unspecified: Secondary | ICD-10-CM | POA: Diagnosis not present

## 2018-04-11 DIAGNOSIS — M25541 Pain in joints of right hand: Secondary | ICD-10-CM | POA: Diagnosis not present

## 2018-04-11 DIAGNOSIS — Z23 Encounter for immunization: Secondary | ICD-10-CM | POA: Diagnosis not present

## 2018-04-26 DIAGNOSIS — G4733 Obstructive sleep apnea (adult) (pediatric): Secondary | ICD-10-CM | POA: Diagnosis not present

## 2018-06-14 DIAGNOSIS — Z1231 Encounter for screening mammogram for malignant neoplasm of breast: Secondary | ICD-10-CM | POA: Diagnosis not present

## 2018-06-14 DIAGNOSIS — R5383 Other fatigue: Secondary | ICD-10-CM | POA: Diagnosis not present

## 2018-06-14 DIAGNOSIS — N920 Excessive and frequent menstruation with regular cycle: Secondary | ICD-10-CM | POA: Diagnosis not present

## 2018-06-14 DIAGNOSIS — Z124 Encounter for screening for malignant neoplasm of cervix: Secondary | ICD-10-CM | POA: Diagnosis not present

## 2018-06-15 DIAGNOSIS — L811 Chloasma: Secondary | ICD-10-CM | POA: Diagnosis not present

## 2018-06-15 DIAGNOSIS — L570 Actinic keratosis: Secondary | ICD-10-CM | POA: Diagnosis not present

## 2018-06-15 DIAGNOSIS — D225 Melanocytic nevi of trunk: Secondary | ICD-10-CM | POA: Diagnosis not present

## 2018-07-05 DIAGNOSIS — N920 Excessive and frequent menstruation with regular cycle: Secondary | ICD-10-CM | POA: Diagnosis not present

## 2018-07-05 DIAGNOSIS — N924 Excessive bleeding in the premenopausal period: Secondary | ICD-10-CM | POA: Diagnosis not present

## 2018-07-06 DIAGNOSIS — E2740 Unspecified adrenocortical insufficiency: Secondary | ICD-10-CM | POA: Diagnosis not present

## 2018-07-06 DIAGNOSIS — I10 Essential (primary) hypertension: Secondary | ICD-10-CM | POA: Diagnosis not present

## 2018-07-12 DIAGNOSIS — R5382 Chronic fatigue, unspecified: Secondary | ICD-10-CM | POA: Diagnosis not present

## 2018-07-12 DIAGNOSIS — M797 Fibromyalgia: Secondary | ICD-10-CM | POA: Diagnosis not present

## 2018-07-12 DIAGNOSIS — R5381 Other malaise: Secondary | ICD-10-CM | POA: Diagnosis not present

## 2018-07-12 DIAGNOSIS — M3500 Sicca syndrome, unspecified: Secondary | ICD-10-CM | POA: Diagnosis not present

## 2018-07-14 DIAGNOSIS — E119 Type 2 diabetes mellitus without complications: Secondary | ICD-10-CM | POA: Diagnosis not present

## 2018-07-14 DIAGNOSIS — N920 Excessive and frequent menstruation with regular cycle: Secondary | ICD-10-CM | POA: Diagnosis not present

## 2018-07-14 DIAGNOSIS — N800 Endometriosis of uterus: Secondary | ICD-10-CM | POA: Diagnosis not present

## 2018-07-14 DIAGNOSIS — Z3202 Encounter for pregnancy test, result negative: Secondary | ICD-10-CM | POA: Diagnosis not present

## 2018-07-19 DIAGNOSIS — R69 Illness, unspecified: Secondary | ICD-10-CM | POA: Diagnosis not present

## 2018-07-19 DIAGNOSIS — Z79891 Long term (current) use of opiate analgesic: Secondary | ICD-10-CM | POA: Diagnosis not present

## 2018-07-27 DIAGNOSIS — I10 Essential (primary) hypertension: Secondary | ICD-10-CM | POA: Diagnosis not present

## 2018-07-27 DIAGNOSIS — E1121 Type 2 diabetes mellitus with diabetic nephropathy: Secondary | ICD-10-CM | POA: Diagnosis not present

## 2018-07-27 DIAGNOSIS — E669 Obesity, unspecified: Secondary | ICD-10-CM | POA: Diagnosis not present

## 2018-07-27 DIAGNOSIS — R809 Proteinuria, unspecified: Secondary | ICD-10-CM | POA: Diagnosis not present

## 2018-08-02 DIAGNOSIS — R69 Illness, unspecified: Secondary | ICD-10-CM | POA: Diagnosis not present

## 2018-08-11 DIAGNOSIS — H52223 Regular astigmatism, bilateral: Secondary | ICD-10-CM | POA: Diagnosis not present

## 2018-08-11 DIAGNOSIS — H524 Presbyopia: Secondary | ICD-10-CM | POA: Diagnosis not present

## 2018-08-11 DIAGNOSIS — H5203 Hypermetropia, bilateral: Secondary | ICD-10-CM | POA: Diagnosis not present

## 2018-08-11 DIAGNOSIS — E119 Type 2 diabetes mellitus without complications: Secondary | ICD-10-CM | POA: Diagnosis not present

## 2018-09-27 ENCOUNTER — Other Ambulatory Visit: Payer: Self-pay | Admitting: Internal Medicine

## 2018-10-18 DIAGNOSIS — R69 Illness, unspecified: Secondary | ICD-10-CM | POA: Diagnosis not present

## 2018-10-18 DIAGNOSIS — Z8619 Personal history of other infectious and parasitic diseases: Secondary | ICD-10-CM | POA: Diagnosis not present

## 2018-10-18 DIAGNOSIS — R209 Unspecified disturbances of skin sensation: Secondary | ICD-10-CM | POA: Diagnosis not present

## 2018-10-18 DIAGNOSIS — R400 Somnolence: Secondary | ICD-10-CM | POA: Diagnosis not present

## 2018-10-18 DIAGNOSIS — D509 Iron deficiency anemia, unspecified: Secondary | ICD-10-CM | POA: Diagnosis not present

## 2018-10-18 DIAGNOSIS — I10 Essential (primary) hypertension: Secondary | ICD-10-CM | POA: Diagnosis not present

## 2018-10-18 DIAGNOSIS — E119 Type 2 diabetes mellitus without complications: Secondary | ICD-10-CM | POA: Diagnosis not present

## 2018-10-18 DIAGNOSIS — E782 Mixed hyperlipidemia: Secondary | ICD-10-CM | POA: Diagnosis not present

## 2018-11-16 ENCOUNTER — Telehealth: Payer: Self-pay | Admitting: Hematology

## 2018-11-16 NOTE — Telephone Encounter (Signed)
Called and LMVM with date/time of appointments. Letter/Calendar mailed

## 2018-11-17 DIAGNOSIS — M79642 Pain in left hand: Secondary | ICD-10-CM | POA: Diagnosis not present

## 2018-11-17 DIAGNOSIS — M79641 Pain in right hand: Secondary | ICD-10-CM | POA: Diagnosis not present

## 2018-11-25 ENCOUNTER — Other Ambulatory Visit: Payer: Self-pay

## 2018-11-25 ENCOUNTER — Ambulatory Visit: Payer: Self-pay | Admitting: Hematology

## 2018-11-28 ENCOUNTER — Telehealth: Payer: Self-pay | Admitting: Family

## 2018-11-28 ENCOUNTER — Other Ambulatory Visit: Payer: Self-pay | Admitting: Family

## 2018-11-28 DIAGNOSIS — D649 Anemia, unspecified: Secondary | ICD-10-CM

## 2018-11-28 NOTE — Telephone Encounter (Signed)
Called and LMVM for patient regarding appointment date/time change from 1/28 due to No MD on site for NP appointment with Judson Roch @[phone] 

## 2018-11-29 ENCOUNTER — Inpatient Hospital Stay: Payer: Medicare HMO

## 2018-11-29 ENCOUNTER — Inpatient Hospital Stay: Payer: Medicare HMO | Admitting: Family

## 2018-11-30 ENCOUNTER — Inpatient Hospital Stay (HOSPITAL_BASED_OUTPATIENT_CLINIC_OR_DEPARTMENT_OTHER): Payer: Medicare HMO | Admitting: Family

## 2018-11-30 ENCOUNTER — Inpatient Hospital Stay: Payer: Medicare HMO | Attending: Family

## 2018-11-30 DIAGNOSIS — Z87891 Personal history of nicotine dependence: Secondary | ICD-10-CM | POA: Insufficient documentation

## 2018-11-30 DIAGNOSIS — M3500 Sicca syndrome, unspecified: Secondary | ICD-10-CM | POA: Insufficient documentation

## 2018-11-30 DIAGNOSIS — Z79899 Other long term (current) drug therapy: Secondary | ICD-10-CM | POA: Insufficient documentation

## 2018-11-30 DIAGNOSIS — E1143 Type 2 diabetes mellitus with diabetic autonomic (poly)neuropathy: Secondary | ICD-10-CM | POA: Insufficient documentation

## 2018-11-30 DIAGNOSIS — I10 Essential (primary) hypertension: Secondary | ICD-10-CM | POA: Diagnosis not present

## 2018-11-30 DIAGNOSIS — K3184 Gastroparesis: Secondary | ICD-10-CM | POA: Insufficient documentation

## 2018-11-30 DIAGNOSIS — I7300 Raynaud's syndrome without gangrene: Secondary | ICD-10-CM | POA: Diagnosis not present

## 2018-11-30 DIAGNOSIS — D509 Iron deficiency anemia, unspecified: Secondary | ICD-10-CM

## 2018-11-30 DIAGNOSIS — F329 Major depressive disorder, single episode, unspecified: Secondary | ICD-10-CM | POA: Insufficient documentation

## 2018-11-30 DIAGNOSIS — E7800 Pure hypercholesterolemia, unspecified: Secondary | ICD-10-CM | POA: Insufficient documentation

## 2018-11-30 DIAGNOSIS — R7989 Other specified abnormal findings of blood chemistry: Secondary | ICD-10-CM | POA: Insufficient documentation

## 2018-11-30 DIAGNOSIS — M797 Fibromyalgia: Secondary | ICD-10-CM | POA: Insufficient documentation

## 2018-11-30 DIAGNOSIS — Z8042 Family history of malignant neoplasm of prostate: Secondary | ICD-10-CM | POA: Insufficient documentation

## 2018-11-30 DIAGNOSIS — E119 Type 2 diabetes mellitus without complications: Secondary | ICD-10-CM | POA: Insufficient documentation

## 2018-11-30 DIAGNOSIS — Z803 Family history of malignant neoplasm of breast: Secondary | ICD-10-CM | POA: Diagnosis not present

## 2018-11-30 DIAGNOSIS — Z791 Long term (current) use of non-steroidal anti-inflammatories (NSAID): Secondary | ICD-10-CM | POA: Diagnosis not present

## 2018-11-30 DIAGNOSIS — R718 Other abnormality of red blood cells: Secondary | ICD-10-CM | POA: Diagnosis not present

## 2018-11-30 DIAGNOSIS — D649 Anemia, unspecified: Secondary | ICD-10-CM

## 2018-11-30 LAB — CBC WITH DIFFERENTIAL (CANCER CENTER ONLY)
Abs Immature Granulocytes: 0.03 10*3/uL (ref 0.00–0.07)
Basophils Absolute: 0 10*3/uL (ref 0.0–0.1)
Basophils Relative: 0 %
EOS PCT: 1 %
Eosinophils Absolute: 0.1 10*3/uL (ref 0.0–0.5)
HCT: 34.4 % — ABNORMAL LOW (ref 36.0–46.0)
Hemoglobin: 10.5 g/dL — ABNORMAL LOW (ref 12.0–15.0)
Immature Granulocytes: 0 %
LYMPHS PCT: 27 %
Lymphs Abs: 2.6 10*3/uL (ref 0.7–4.0)
MCH: 23.8 pg — ABNORMAL LOW (ref 26.0–34.0)
MCHC: 30.5 g/dL (ref 30.0–36.0)
MCV: 77.8 fL — ABNORMAL LOW (ref 80.0–100.0)
Monocytes Absolute: 0.6 10*3/uL (ref 0.1–1.0)
Monocytes Relative: 7 %
Neutro Abs: 6.4 10*3/uL (ref 1.7–7.7)
Neutrophils Relative %: 65 %
Platelet Count: 330 10*3/uL (ref 150–400)
RBC: 4.42 MIL/uL (ref 3.87–5.11)
RDW: 15.5 % (ref 11.5–15.5)
WBC: 9.7 10*3/uL (ref 4.0–10.5)
nRBC: 0 % (ref 0.0–0.2)

## 2018-11-30 LAB — CMP (CANCER CENTER ONLY)
ALK PHOS: 76 U/L (ref 38–126)
ALT: 16 U/L (ref 0–44)
AST: 13 U/L — ABNORMAL LOW (ref 15–41)
Albumin: 4.2 g/dL (ref 3.5–5.0)
Anion gap: 8 (ref 5–15)
BUN: 17 mg/dL (ref 6–20)
CALCIUM: 9.1 mg/dL (ref 8.9–10.3)
CO2: 26 mmol/L (ref 22–32)
Chloride: 103 mmol/L (ref 98–111)
Creatinine: 0.62 mg/dL (ref 0.44–1.00)
GFR, Est AFR Am: >60 mL/min (ref >60)
GFR, Estimated: >60 mL/min (ref >60)
Glucose, Bld: 143 mg/dL — ABNORMAL HIGH (ref 70–99)
Potassium: 4.1 mmol/L (ref 3.5–5.1)
SODIUM: 137 mmol/L (ref 135–145)
Total Bilirubin: 0.2 mg/dL — ABNORMAL LOW (ref 0.3–1.2)
Total Protein: 7.1 g/dL (ref 6.5–8.1)

## 2018-11-30 LAB — RETICULOCYTES
Immature Retic Fract: 18.2 % — ABNORMAL HIGH (ref 2.3–15.9)
RBC.: 4.42 MIL/uL (ref 3.87–5.11)
Retic Count, Absolute: 60.6 10*3/uL (ref 19.0–186.0)
Retic Ct Pct: 1.4 % (ref 0.4–3.1)

## 2018-11-30 LAB — SAVE SMEAR(SSMR), FOR PROVIDER SLIDE REVIEW

## 2018-11-30 LAB — VITAMIN B12: Vitamin B-12: 302 pg/mL (ref 180–914)

## 2018-11-30 NOTE — Progress Notes (Signed)
Hematology/Oncology Consultation   Name: [name]      MRN: [id number]    Location: Room/bed info not found  Date: 11/30/2018 Time:3:16 PM   REFERRING PHYSICIAN: Aura Dials, MD  REASON FOR CONSULT: Anemia   DIAGNOSIS: Iron deficiency anemia   HISTORY OF PRESENT ILLNESS: [name] is a very pleasant 48 yo caucasian female with iron deficiency anemia.  She is symptomatic at this time with fatigue, weakness, occasional nausea (no vomiting), decreased appetite, chills, craves cold drinks, SOB with exertion and brain fog.  She failed oral iron do to GI upset and constipation leading to 3 ED visits for impaction.  She states that she has not noted any episodes of bleeding.  She had a uterine ablation in 2018 and states that she only spots once a month now. Prior to the ablation her cycle was regular she just "wanted to stop it". She has 2 children, no history of miscarriage.  Her mother had both MS and type I juvenile diabetes.  Father had history of psoriasis, psoriatic arthritis and prostate cancer.  Paternal grandmother had cancer of unknown primary.  Paternal aunt has Lupus and RA.  Paternal cousin has RA.  She has Raynaud's disease with some associated numbness and tingling in her fingertips at times. She states that she has been diagnosed with Sjogren's and fibromyalgia and is followed by rheumatologist Dr. Flonnie Overman with [facility]. She also feels that she may have Lupus.  She will have an occasional low grade fever and body aches. This comes and goes  She states that she has diabetes type II and is on Januvia. Her blood sugars have been well controlled.  She currently sees nephrologist Dr. Neta Ehlers at [facility] for protein in her urine.  She denies having any issues with frequent infection. No  cough, rash, dizziness, chest pain, palpitations, abdominal pain or changes in bowel or bladder habits.  She has history of gastroparesis.  No swelling in her extremities at this time.  No  lymphadenopathy noted on exam.  She is doing her best to stay well hydrated. She states that her weight is stable.  She does not smoke or drink alcoholic beverages.  She is originally from [place], [place]. She moved to [facility] for work in 1996.  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   ROS: All other 10 point review of systems is negative.   PAST MEDICAL HISTORY:   Past Medical History:  Diagnosis Date  . Allergy   . Anemia   . Anxiety   . Depression   . Diabetes ([place])   . Elevated LFTs   . Fibromyalgia   . Hypercholesteremia   . Hypertension   . Hypomagnesemia   . Morbid obesity ([place])   . Sjoegren syndrome ([facility])     ALLERGIES: Allergies  Allergen Reactions  . Penicillins     REACTION: hives  . Sulfonamide Derivatives     REACTION: hives      MEDICATIONS:  Current Outpatient Medications on File Prior to Visit  Medication Sig Dispense Refill  . atorvastatin (LIPITOR) 20 MG tablet Take 20 mg by mouth daily.    Marland Kitchen buPROPion (WELLBUTRIN XL) 300 MG 24 hr tablet Take 300 mg by mouth daily.    . clonazePAM (KLONOPIN) 1 MG tablet Take 1 mg by mouth 4 (four) times daily.  2  . DULoxetine (CYMBALTA) 60 MG capsule Take 60 mg daily by mouth.    . hydroxychloroquine (PLAQUENIL) 200 MG  tablet Take 400 mg by mouth daily.    Marland Kitchen linaclotide (LINZESS) 290 MCG CAPS capsule Take 290 mcg daily before breakfast by mouth.    . lisdexamfetamine (VYVANSE) 70 MG capsule Take 70 mg by mouth 1 day or 1 dose.    Marland Kitchen lisinopril (PRINIVIL,ZESTRIL) 5 MG tablet Take 5 mg daily by mouth.    . naproxen (NAPROSYN) 500 MG tablet Take 500 mg by mouth 2 (two) times daily as needed.     Marland Kitchen omeprazole (PRILOSEC) 40 MG capsule Take 40 mg by mouth daily.    Marland Kitchen omeprazole (PRILOSEC) 40 MG capsule Take 1 capsule (40 mg total) by mouth daily. 90 capsule 3  . ondansetron (ZOFRAN-ODT) 8 MG disintegrating tablet Take 1 tablet (8 mg total) by mouth every 6 (six) hours as needed for nausea or  vomiting. 30 tablet 1  . polyethylene glycol (MIRALAX / GLYCOLAX) packet Take 17 g by mouth daily. (Patient not taking: Reported on 11/18/2017) 30 each 0  . promethazine (PHENERGAN) 25 MG tablet TAKE 1 TABLET(25 MG) BY MOUTH EVERY 8 HOURS AS NEEDED FOR NAUSEA OR VOMITING 100 tablet 0  . sitaGLIPtin (JANUVIA) 50 MG tablet Take 50 mg by mouth.     Current Facility-Administered Medications on File Prior to Visit  Medication Dose Route Frequency Provider Last Rate Last Dose  . 0.9 %  sodium chloride infusion  500 mL Intravenous Continuous Irene Shipper, MD         PAST SURGICAL HISTORY Past Surgical History:  Procedure Laterality Date  . COLON SURGERY    . ECT TREATMENT    . ESSURE TUBAL LIGATION    . TONSILLECTOMY    . UPPER GASTROINTESTINAL ENDOSCOPY    . WISDOM TOOTH EXTRACTION      FAMILY HISTORY: Family History  Problem Relation Age of Onset  . Multiple sclerosis Mother   . Diabetes Mother        Juvenile  . Psoriasis Father   . Prostate cancer Father   . Diabetes Father   . Lupus Paternal Aunt   . Breast cancer Paternal Aunt   . Rheum arthritis Paternal Aunt   . Colon cancer Neg Hx   . Esophageal cancer Neg Hx   . Rectal cancer Neg Hx   . Stomach cancer Neg Hx     SOCIAL HISTORY:  reports that she quit smoking about 26 years ago. Her smoking use included cigarettes. She has never used smokeless tobacco. She reports that she does not drink alcohol or use drugs.  PERFORMANCE STATUS: The patient's performance status is 1 - Symptomatic but completely ambulatory  PHYSICAL EXAM: Most Recent Vital Signs: There were no vitals taken for this visit. There were no vitals taken for this visit.  General Appearance:    Alert, cooperative, no distress, appears stated age  Head:    Normocephalic, without obvious abnormality, atraumatic  Eyes:    PERRL, conjunctiva/corneas clear, EOM's intact, fundi    benign, both eyes        Throat:   Lips, mucosa, and tongue normal; teeth  and gums normal  Neck:   Supple, symmetrical, trachea midline, no adenopathy;    thyroid:  no enlargement/tenderness/nodules; no carotid   bruit or JVD  Back:     Symmetric, no curvature, ROM normal, no CVA tenderness  Lungs:     Clear to auscultation bilaterally, respirations unlabored  Chest Wall:    No tenderness or deformity   Heart:    Regular rate and rhythm,  S1 and S2 normal, no murmur, rub   or gallop     Abdomen:     Soft, non-tender, bowel sounds active all four quadrants,    no masses, no organomegaly        Extremities:   Extremities normal, atraumatic, no cyanosis or edema  Pulses:   2+ and symmetric all extremities  Skin:   Skin color, texture, turgor normal, no rashes or lesions  Lymph nodes:   Cervical, supraclavicular, and axillary nodes normal  Neurologic:   CNII-XII intact, normal strength, sensation and reflexes    throughout    LABORATORY DATA:  Results for orders placed or performed in visit on 11/30/18 (from the past 48 hour(s))  CBC with Differential ([facility] Only)     Status: Abnormal (Preliminary result)   Collection Time: 11/30/18  2:54 PM  Result Value Ref Range   WBC Count 9.7 4.0 - 10.5 K/uL   RBC 4.42 3.87 - 5.11 MIL/uL   Hemoglobin 10.5 (L) 12.0 - 15.0 g/dL    Comment: Reticulocyte Hemoglobin testing may be clinically indicated, consider ordering this additional test [id number]    HCT 34.4 (L) 36.0 - 46.0 %   MCV 77.8 (L) 80.0 - 100.0 fL   MCH 23.8 (L) 26.0 - 34.0 pg   MCHC 30.5 30.0 - 36.0 g/dL   RDW 15.5 11.5 - 15.5 %   Platelet Count 330 150 - 400 K/uL   nRBC 0.0 0.0 - 0.2 %    Comment: Performed at [facility] at [facility], [address] [place], [place] [address]   Neutrophils Relative % PENDING %   Neutro Abs PENDING 1.7 - 7.7 K/uL   Band Neutrophils PENDING %   Lymphocytes Relative PENDING %   Lymphs Abs PENDING 0.7 - 4.0 K/uL   Monocytes Relative PENDING %   Monocytes Absolute PENDING 0.1 - 1.0  K/uL   Eosinophils Relative PENDING %   Eosinophils Absolute PENDING 0.0 - 0.5 K/uL   Basophils Relative PENDING %   Basophils Absolute PENDING 0.0 - 0.1 K/uL   WBC Morphology PENDING    RBC Morphology PENDING    Smear Review PENDING    Other PENDING %   nRBC PENDING 0 /100 WBC   Metamyelocytes Relative PENDING %   Myelocytes PENDING %   Promyelocytes Relative PENDING %   Blasts PENDING %  Reticulocytes     Status: Abnormal   Collection Time: 11/30/18  2:54 PM  Result Value Ref Range   Retic Ct Pct 1.4 0.4 - 3.1 %   RBC. 4.42 3.87 - 5.11 MIL/uL   Retic Count, Absolute 60.6 19.0 - 186.0 K/uL   Immature Retic Fract 18.2 (H) 2.3 - 15.9 %    Comment: Performed at [facility] at [facility], [address] [place], [place] [address]      RADIOGRAPHY: No results found.     PATHOLOGY: None  ASSESSMENT/PLAN: Ms. Troxler is a very pleasant 48 yo caucasian female with history iron deficiency anemia. She has several autoimmune issues including Sjogren's, fibromyalgia and type II diabetes.  We will see what her iron studies show and bring her back in for IV iron next week.  We will plan to see her back in another 6-8 weeks for follow-up.   All questions were answered and she is in agreement with the plan. She will contact our office with any questions or concerns. We can certainly see her  sooner if need be.   She was discussed with and also seen by Dr. Marin Olp and he is in agreement with the aforementioned.   Laverna Peace     Addendum: I saw and examined Ms. Weberg with Judson Roch.  She is a very nice.  She does have some interesting issues.  I really think that the fact that she has collagen vascular problems is a factor with respect to her anemia.  She is clearly iron deficient.  Her ferritin is only 5 with an iron saturation of 6%.  She has a normal LDH.  She has a normal vitamin B-12 of 302.  We did check her for thalassemia.  Her alpha thalassemia  genotype was negative.  We looked at her blood under the microscope.  She had some microcytic red blood cells.  She has some hypochromic red blood cells.  There was some slight anisocytosis.  I really believe that IV iron will help her out.  I believe that she is markedly iron deficient.  I do not think that oral iron is going to work.  I believe that the fact that she is on Prilosec will prevent her from absorbing oral iron.  We spent about 40 minutes with her.  Again she is very nice.  We will get her in for IV iron.  She will need 2 doses.  We will then plan to get her back to see me in about 6 weeks.  By then, the iron will have had a chance to be absorbed and utilized.  Lattie Haw, MD

## 2018-12-01 ENCOUNTER — Telehealth: Payer: Self-pay | Admitting: Family

## 2018-12-01 LAB — IRON AND TIBC
Iron: 24 ug/dL — ABNORMAL LOW (ref 41–142)
Saturation Ratios: 6 % — ABNORMAL LOW (ref 21–57)
TIBC: 380 ug/dL (ref 236–444)
UIBC: 356 ug/dL (ref 120–384)

## 2018-12-01 LAB — LACTATE DEHYDROGENASE: LDH: 188 U/L (ref 98–192)

## 2018-12-01 LAB — FERRITIN: Ferritin: 5 ng/mL — ABNORMAL LOW (ref 11–307)

## 2018-12-01 NOTE — Telephone Encounter (Signed)
I called and spoke with patient regarding appointments added for iron infusion.  I did change her 2/4 appt time from 3:00 to 2:00 as well.  She was ok with times scheduled per 1/30 sch msg

## 2018-12-05 DIAGNOSIS — R69 Illness, unspecified: Secondary | ICD-10-CM | POA: Diagnosis not present

## 2018-12-05 DIAGNOSIS — F3181 Bipolar II disorder: Secondary | ICD-10-CM | POA: Diagnosis not present

## 2018-12-06 ENCOUNTER — Inpatient Hospital Stay: Payer: Medicare HMO

## 2018-12-06 ENCOUNTER — Inpatient Hospital Stay: Payer: Medicare HMO | Attending: Hematology

## 2018-12-06 VITALS — BP 143/59 | HR 80 | Resp 16

## 2018-12-06 DIAGNOSIS — D509 Iron deficiency anemia, unspecified: Secondary | ICD-10-CM

## 2018-12-06 MED ORDER — SODIUM CHLORIDE 0.9 % IV SOLN
510.0000 mg | Freq: Once | INTRAVENOUS | Status: AC
  Administered 2018-12-06: 510 mg via INTRAVENOUS
  Filled 2018-12-06: qty 17

## 2018-12-06 MED ORDER — SODIUM CHLORIDE 0.9 % IV SOLN
Freq: Once | INTRAVENOUS | Status: AC
  Administered 2018-12-06: 14:00:00 via INTRAVENOUS
  Filled 2018-12-06: qty 250

## 2018-12-06 NOTE — Patient Instructions (Signed)

## 2018-12-08 ENCOUNTER — Other Ambulatory Visit: Payer: Self-pay | Admitting: Family

## 2018-12-09 LAB — HEMOGLOBINOPATHY EVALUATION

## 2018-12-09 LAB — ALPHA-THALASSEMIA GENOTYPR

## 2018-12-13 ENCOUNTER — Inpatient Hospital Stay: Payer: Medicare HMO

## 2018-12-13 ENCOUNTER — Ambulatory Visit: Payer: Medicare HMO

## 2018-12-13 VITALS — BP 143/74 | HR 77 | Temp 98.4°F | Resp 17

## 2018-12-13 VITALS — BP 139/72

## 2018-12-13 DIAGNOSIS — D509 Iron deficiency anemia, unspecified: Secondary | ICD-10-CM

## 2018-12-13 MED ORDER — SODIUM CHLORIDE 0.9 % IV SOLN
510.0000 mg | Freq: Once | INTRAVENOUS | Status: AC
  Administered 2018-12-13: 510 mg via INTRAVENOUS
  Filled 2018-12-13: qty 17

## 2018-12-13 MED ORDER — SODIUM CHLORIDE 0.9 % IV SOLN
Freq: Once | INTRAVENOUS | Status: DC
  Filled 2018-12-13: qty 250

## 2018-12-13 MED ORDER — SODIUM CHLORIDE 0.9 % IV SOLN
510.0000 mg | Freq: Once | INTRAVENOUS | Status: DC
  Filled 2018-12-13: qty 17

## 2018-12-13 MED ORDER — SODIUM CHLORIDE 0.9% FLUSH
10.0000 mL | Freq: Once | INTRAVENOUS | Status: DC | PRN
  Filled 2018-12-13: qty 10

## 2018-12-13 MED ORDER — SODIUM CHLORIDE 0.9 % IV SOLN
Freq: Once | INTRAVENOUS | Status: AC
  Administered 2018-12-13: 15:00:00 via INTRAVENOUS
  Filled 2018-12-13: qty 250

## 2018-12-13 MED ORDER — SODIUM CHLORIDE 0.9% FLUSH
3.0000 mL | Freq: Once | INTRAVENOUS | Status: DC | PRN
  Filled 2018-12-13: qty 10

## 2018-12-13 NOTE — Patient Instructions (Signed)

## 2019-01-06 DIAGNOSIS — R5383 Other fatigue: Secondary | ICD-10-CM | POA: Diagnosis not present

## 2019-01-06 DIAGNOSIS — E119 Type 2 diabetes mellitus without complications: Secondary | ICD-10-CM | POA: Diagnosis not present

## 2019-01-06 DIAGNOSIS — R809 Proteinuria, unspecified: Secondary | ICD-10-CM | POA: Diagnosis not present

## 2019-01-06 DIAGNOSIS — R760 Raised antibody titer: Secondary | ICD-10-CM | POA: Diagnosis not present

## 2019-01-06 DIAGNOSIS — M79642 Pain in left hand: Secondary | ICD-10-CM | POA: Diagnosis not present

## 2019-01-06 DIAGNOSIS — M79641 Pain in right hand: Secondary | ICD-10-CM | POA: Diagnosis not present

## 2019-01-06 DIAGNOSIS — H04123 Dry eye syndrome of bilateral lacrimal glands: Secondary | ICD-10-CM | POA: Diagnosis not present

## 2019-01-06 DIAGNOSIS — M3500 Sicca syndrome, unspecified: Secondary | ICD-10-CM | POA: Diagnosis not present

## 2019-01-06 DIAGNOSIS — R110 Nausea: Secondary | ICD-10-CM | POA: Diagnosis not present

## 2019-01-06 DIAGNOSIS — G8929 Other chronic pain: Secondary | ICD-10-CM | POA: Diagnosis not present

## 2019-01-06 DIAGNOSIS — M797 Fibromyalgia: Secondary | ICD-10-CM | POA: Diagnosis not present

## 2019-01-10 DIAGNOSIS — D509 Iron deficiency anemia, unspecified: Secondary | ICD-10-CM | POA: Diagnosis not present

## 2019-01-11 DIAGNOSIS — L578 Other skin changes due to chronic exposure to nonionizing radiation: Secondary | ICD-10-CM | POA: Diagnosis not present

## 2019-01-11 DIAGNOSIS — L718 Other rosacea: Secondary | ICD-10-CM | POA: Diagnosis not present

## 2019-01-11 DIAGNOSIS — L720 Epidermal cyst: Secondary | ICD-10-CM | POA: Diagnosis not present

## 2019-01-17 DIAGNOSIS — R69 Illness, unspecified: Secondary | ICD-10-CM | POA: Diagnosis not present

## 2019-01-25 ENCOUNTER — Inpatient Hospital Stay: Payer: Medicare HMO | Admitting: Family

## 2019-01-25 ENCOUNTER — Inpatient Hospital Stay: Payer: Medicare HMO

## 2019-02-01 DIAGNOSIS — K130 Diseases of lips: Secondary | ICD-10-CM | POA: Diagnosis not present

## 2019-02-22 ENCOUNTER — Ambulatory Visit: Payer: Medicare HMO | Admitting: Family

## 2019-02-22 ENCOUNTER — Other Ambulatory Visit: Payer: Medicare HMO

## 2019-04-04 ENCOUNTER — Inpatient Hospital Stay: Payer: Medicare HMO | Attending: Hematology & Oncology

## 2019-04-04 ENCOUNTER — Inpatient Hospital Stay (HOSPITAL_BASED_OUTPATIENT_CLINIC_OR_DEPARTMENT_OTHER): Payer: Medicare HMO | Admitting: Family

## 2019-04-04 ENCOUNTER — Other Ambulatory Visit: Payer: Self-pay

## 2019-04-04 ENCOUNTER — Encounter: Payer: Self-pay | Admitting: Family

## 2019-04-04 VITALS — BP 150/60 | HR 93 | Temp 97.8°F | Wt 287.0 lb

## 2019-04-04 DIAGNOSIS — E119 Type 2 diabetes mellitus without complications: Secondary | ICD-10-CM | POA: Diagnosis not present

## 2019-04-04 DIAGNOSIS — Z79899 Other long term (current) drug therapy: Secondary | ICD-10-CM

## 2019-04-04 DIAGNOSIS — I7300 Raynaud's syndrome without gangrene: Secondary | ICD-10-CM | POA: Insufficient documentation

## 2019-04-04 DIAGNOSIS — D509 Iron deficiency anemia, unspecified: Secondary | ICD-10-CM | POA: Diagnosis not present

## 2019-04-04 LAB — CMP (CANCER CENTER ONLY)
ALT: 52 U/L — ABNORMAL HIGH (ref 0–44)
AST: 38 U/L (ref 15–41)
Albumin: 4.5 g/dL (ref 3.5–5.0)
Alkaline Phosphatase: 73 U/L (ref 38–126)
Anion gap: 10 (ref 5–15)
BUN: 19 mg/dL (ref 6–20)
CO2: 28 mmol/L (ref 22–32)
Calcium: 9.2 mg/dL (ref 8.9–10.3)
Chloride: 102 mmol/L (ref 98–111)
Creatinine: 0.69 mg/dL (ref 0.44–1.00)
GFR, Est AFR Am: >60 mL/min (ref >60)
GFR, Estimated: >60 mL/min (ref >60)
Glucose, Bld: 178 mg/dL — ABNORMAL HIGH (ref 70–99)
Potassium: 3.8 mmol/L (ref 3.5–5.1)
Sodium: 140 mmol/L (ref 135–145)
Total Bilirubin: 0.5 mg/dL (ref 0.3–1.2)
Total Protein: 7.8 g/dL (ref 6.5–8.1)

## 2019-04-04 LAB — CBC WITH DIFFERENTIAL (CANCER CENTER ONLY)
Abs Immature Granulocytes: 0.03 10*3/uL (ref 0.00–0.07)
Basophils Absolute: 0 10*3/uL (ref 0.0–0.1)
Basophils Relative: 0 %
Eosinophils Absolute: 0 10*3/uL (ref 0.0–0.5)
Eosinophils Relative: 0 %
HCT: 44.3 % (ref 36.0–46.0)
Hemoglobin: 14.4 g/dL (ref 12.0–15.0)
Immature Granulocytes: 0 %
Lymphocytes Relative: 19 %
Lymphs Abs: 2.2 10*3/uL (ref 0.7–4.0)
MCH: 28.9 pg (ref 26.0–34.0)
MCHC: 32.5 g/dL (ref 30.0–36.0)
MCV: 88.8 fL (ref 80.0–100.0)
Monocytes Absolute: 0.8 10*3/uL (ref 0.1–1.0)
Monocytes Relative: 7 %
Neutro Abs: 8.4 10*3/uL — ABNORMAL HIGH (ref 1.7–7.7)
Neutrophils Relative %: 74 %
Platelet Count: 310 10*3/uL (ref 150–400)
RBC: 4.99 MIL/uL (ref 3.87–5.11)
RDW: 13.7 % (ref 11.5–15.5)
WBC Count: 11.5 10*3/uL — ABNORMAL HIGH (ref 4.0–10.5)
nRBC: 0 % (ref 0.0–0.2)

## 2019-04-04 NOTE — Progress Notes (Signed)
Hematology and Oncology Follow Up Visit  [name] [id number] 1971-09-17 48 y.o. 04/04/2019   Principle Diagnosis:  Iron deficiency anemia   Current Therapy:   IV iron as indicated    Interim History:  [name] is here today for follow-up. She has been having a lot of joint pain associated with Sjogren's and fibromyalgia. She has also noted discoloration in her hands off and on with Raynaud's.  She is currently on a steroid taper which she states has helped tremendously.  She has not noted any episodes of bleeding. No bruising or petechiae.  No fever, chills, n/v, cough, rash, dizziness, SOB, chest pain, palpitations, abdominal pain or changes in bowel or bladder habits.  No numbness or tingling in her extremities at this time.  No lymphadenopathy noted on exam.  Her appetite comes and goes. She admits to needing to drink more fluids daily. Her weight is 187 lbs today.   ECOG Performance Status: 1 - Symptomatic but completely ambulatory  Medications:  Allergies as of 04/04/2019      Reactions   Penicillins    REACTION: hives   Sulfonamide Derivatives    REACTION: hives      Medication List       Accurate as of April 04, 2019  2:17 PM. If you have any questions, ask your nurse or doctor.        atorvastatin 20 MG tablet Commonly known as:  LIPITOR Take 20 mg by mouth daily.   buPROPion 300 MG 24 hr tablet Commonly known as:  WELLBUTRIN XL Take 300 mg by mouth daily.   clonazePAM 1 MG tablet Commonly known as:  KLONOPIN Take 1 mg by mouth 4 (four) times daily.   DULoxetine 60 MG capsule Commonly known as:  CYMBALTA Take 60 mg daily by mouth.   hydroxychloroquine 200 MG tablet Commonly known as:  PLAQUENIL Take 400 mg by mouth daily.   Linzess 290 MCG Caps capsule Generic drug:  linaclotide Take 290 mcg daily before breakfast by mouth.   lisdexamfetamine 70 MG capsule Commonly known as:  VYVANSE Take 70 mg by mouth 1 day or 1 dose.   lisinopril 5 MG  tablet Commonly known as:  ZESTRIL Take 5 mg daily by mouth.   naproxen 500 MG tablet Commonly known as:  NAPROSYN Take 500 mg by mouth 2 (two) times daily as needed.   omeprazole 40 MG capsule Commonly known as:  PRILOSEC Take 40 mg by mouth daily.   omeprazole 40 MG capsule Commonly known as:  PRILOSEC Take 1 capsule (40 mg total) by mouth daily.   ondansetron 8 MG disintegrating tablet Commonly known as:  ZOFRAN-ODT Take 1 tablet (8 mg total) by mouth every 6 (six) hours as needed for nausea or vomiting.   polyethylene glycol 17 g packet Commonly known as:  MIRALAX / GLYCOLAX Take 17 g by mouth daily.   promethazine 25 MG tablet Commonly known as:  PHENERGAN TAKE 1 TABLET(25 MG) BY MOUTH EVERY 8 HOURS AS NEEDED FOR NAUSEA OR VOMITING   sitaGLIPtin 50 MG tablet Commonly known as:  JANUVIA Take 50 mg by mouth.       Allergies:  Allergies  Allergen Reactions  . Penicillins     REACTION: hives  . Sulfonamide Derivatives     REACTION: hives    Past Medical History, Surgical history, Social history, and Family History were reviewed and updated.  Review of Systems: All other 10 point review of systems is negative.   Physical  Exam:  vitals were not taken for this visit.   Wt Readings from Last 3 Encounters:  11/30/18 268 lb 4 oz (121.7 kg)  11/18/17 255 lb (115.7 kg)  10/19/17 255 lb (115.7 kg)    Ocular: Sclerae unicteric, pupils equal, round and reactive to light Ear-nose-throat: Oropharynx clear, dentition fair Lymphatic: No cervical or supraclavicular adenopathy Lungs no rales or rhonchi, good excursion bilaterally Heart regular rate and rhythm, no murmur appreciated Abd soft, nontender, positive bowel sounds, no liver or spleen tip palpated on exam, no fluid wave  MSK no focal spinal tenderness, no joint edema Neuro: non-focal, well-oriented, appropriate affect Breasts: Deferred   Lab Results  Component Value Date   WBC 9.7 11/30/2018   HGB 10.5  (L) 11/30/2018   HCT 34.4 (L) 11/30/2018   MCV 77.8 (L) 11/30/2018   PLT 330 11/30/2018   Lab Results  Component Value Date   FERRITIN 5 (L) 11/30/2018   IRON 24 (L) 11/30/2018   TIBC 380 11/30/2018   UIBC 356 11/30/2018   IRONPCTSAT 6 (L) 11/30/2018   Lab Results  Component Value Date   RETICCTPCT 1.4 11/30/2018   RBC 4.42 11/30/2018   RBC 4.42 11/30/2018   No results found for: KPAFRELGTCHN, LAMBDASER, KAPLAMBRATIO No results found for: IGGSERUM, IGA, IGMSERUM No results found for: Odetta Pink, SPEI   Chemistry      Component Value Date/Time   NA 137 11/30/2018 1454   K 4.1 11/30/2018 1454   CL 103 11/30/2018 1454   CO2 26 11/30/2018 1454   BUN 17 11/30/2018 1454   CREATININE 0.62 11/30/2018 1454      Component Value Date/Time   CALCIUM 9.1 11/30/2018 1454   ALKPHOS 76 11/30/2018 1454   AST 13 (L) 11/30/2018 1454   ALT 16 11/30/2018 1454   BILITOT 0.2 (L) 11/30/2018 1454       Impression and Plan: [name] is a very pleasant 48 yo caucasian female with history iron deficiency anemia as well as several autoimmune issues including Sjogren's, fibromyalgia and type II diabetes.  She received 2 doses of IV iron in February and tolerated them well. Her Hgb is up to 14.4, MCV 88.  We will see what her iron studies show and bring her back in for infusion if needed.  We will go ahead and plan to see her back in another 3 months.  She will contact our office with any questions or concerns. We can certainly use her sooner if need be.   Laverna Peace, NP 6/2/20202:17 PM

## 2019-04-05 ENCOUNTER — Telehealth: Payer: Self-pay | Admitting: Hematology & Oncology

## 2019-04-05 LAB — IRON AND TIBC
Iron: 79 ug/dL (ref 41–142)
Saturation Ratios: 24 % (ref 21–57)
TIBC: 333 ug/dL (ref 236–444)
UIBC: 253 ug/dL (ref 120–384)

## 2019-04-05 LAB — FERRITIN: Ferritin: 39 ng/mL (ref 11–307)

## 2019-04-05 NOTE — Telephone Encounter (Signed)
Called and spoke with regarding date/time of appointments per 6/2 los

## 2019-04-07 DIAGNOSIS — I10 Essential (primary) hypertension: Secondary | ICD-10-CM | POA: Diagnosis not present

## 2019-04-07 DIAGNOSIS — E1121 Type 2 diabetes mellitus with diabetic nephropathy: Secondary | ICD-10-CM | POA: Diagnosis not present

## 2019-04-07 DIAGNOSIS — R801 Persistent proteinuria, unspecified: Secondary | ICD-10-CM | POA: Diagnosis not present

## 2019-04-10 DIAGNOSIS — R801 Persistent proteinuria, unspecified: Secondary | ICD-10-CM | POA: Diagnosis not present

## 2019-04-10 DIAGNOSIS — I10 Essential (primary) hypertension: Secondary | ICD-10-CM | POA: Diagnosis not present

## 2019-04-10 DIAGNOSIS — E1121 Type 2 diabetes mellitus with diabetic nephropathy: Secondary | ICD-10-CM | POA: Diagnosis not present

## 2019-04-10 DIAGNOSIS — Z6841 Body Mass Index (BMI) 40.0 and over, adult: Secondary | ICD-10-CM | POA: Diagnosis not present

## 2019-04-10 DIAGNOSIS — Z87891 Personal history of nicotine dependence: Secondary | ICD-10-CM | POA: Diagnosis not present

## 2019-04-24 DIAGNOSIS — E1121 Type 2 diabetes mellitus with diabetic nephropathy: Secondary | ICD-10-CM | POA: Diagnosis not present

## 2019-05-09 DIAGNOSIS — M3500 Sicca syndrome, unspecified: Secondary | ICD-10-CM | POA: Diagnosis not present

## 2019-05-12 DIAGNOSIS — E119 Type 2 diabetes mellitus without complications: Secondary | ICD-10-CM | POA: Diagnosis not present

## 2019-05-12 DIAGNOSIS — I10 Essential (primary) hypertension: Secondary | ICD-10-CM | POA: Diagnosis not present

## 2019-05-12 DIAGNOSIS — E7849 Other hyperlipidemia: Secondary | ICD-10-CM | POA: Diagnosis not present

## 2019-05-12 DIAGNOSIS — D508 Other iron deficiency anemias: Secondary | ICD-10-CM | POA: Diagnosis not present

## 2019-05-12 DIAGNOSIS — Z0000 Encounter for general adult medical examination without abnormal findings: Secondary | ICD-10-CM | POA: Diagnosis not present

## 2019-05-16 DIAGNOSIS — E119 Type 2 diabetes mellitus without complications: Secondary | ICD-10-CM | POA: Diagnosis not present

## 2019-05-16 DIAGNOSIS — Z0000 Encounter for general adult medical examination without abnormal findings: Secondary | ICD-10-CM | POA: Diagnosis not present

## 2019-05-16 DIAGNOSIS — E782 Mixed hyperlipidemia: Secondary | ICD-10-CM | POA: Diagnosis not present

## 2019-06-28 ENCOUNTER — Inpatient Hospital Stay: Payer: Medicare HMO | Attending: Hematology & Oncology

## 2019-06-28 ENCOUNTER — Other Ambulatory Visit: Payer: Self-pay

## 2019-06-28 DIAGNOSIS — Z79899 Other long term (current) drug therapy: Secondary | ICD-10-CM | POA: Insufficient documentation

## 2019-06-28 DIAGNOSIS — D509 Iron deficiency anemia, unspecified: Secondary | ICD-10-CM | POA: Diagnosis not present

## 2019-06-28 DIAGNOSIS — I7300 Raynaud's syndrome without gangrene: Secondary | ICD-10-CM | POA: Insufficient documentation

## 2019-06-28 DIAGNOSIS — E119 Type 2 diabetes mellitus without complications: Secondary | ICD-10-CM | POA: Diagnosis not present

## 2019-06-28 LAB — CMP (CANCER CENTER ONLY)
ALT: 49 U/L — ABNORMAL HIGH (ref 0–44)
AST: 31 U/L (ref 15–41)
Albumin: 4.4 g/dL (ref 3.5–5.0)
Alkaline Phosphatase: 80 U/L (ref 38–126)
Anion gap: 9 (ref 5–15)
BUN: 15 mg/dL (ref 6–20)
CO2: 31 mmol/L (ref 22–32)
Calcium: 9 mg/dL (ref 8.9–10.3)
Chloride: 101 mmol/L (ref 98–111)
Creatinine: 0.75 mg/dL (ref 0.44–1.00)
GFR, Est AFR Am: >60 mL/min (ref >60)
GFR, Estimated: >60 mL/min (ref >60)
Glucose, Bld: 148 mg/dL — ABNORMAL HIGH (ref 70–99)
Potassium: 4.2 mmol/L (ref 3.5–5.1)
Sodium: 141 mmol/L (ref 135–145)
Total Bilirubin: 0.3 mg/dL (ref 0.3–1.2)
Total Protein: 7.5 g/dL (ref 6.5–8.1)

## 2019-06-28 LAB — CBC WITH DIFFERENTIAL (CANCER CENTER ONLY)
Abs Immature Granulocytes: 0.02 10*3/uL (ref 0.00–0.07)
Basophils Absolute: 0 10*3/uL (ref 0.0–0.1)
Basophils Relative: 0 %
Eosinophils Absolute: 0 10*3/uL (ref 0.0–0.5)
Eosinophils Relative: 0 %
HCT: 45.5 % (ref 36.0–46.0)
Hemoglobin: 14.6 g/dL (ref 12.0–15.0)
Immature Granulocytes: 0 %
Lymphocytes Relative: 32 %
Lymphs Abs: 3.1 10*3/uL (ref 0.7–4.0)
MCH: 28.9 pg (ref 26.0–34.0)
MCHC: 32.1 g/dL (ref 30.0–36.0)
MCV: 90.1 fL (ref 80.0–100.0)
Monocytes Absolute: 0.8 10*3/uL (ref 0.1–1.0)
Monocytes Relative: 8 %
Neutro Abs: 5.6 10*3/uL (ref 1.7–7.7)
Neutrophils Relative %: 60 %
Platelet Count: 254 10*3/uL (ref 150–400)
RBC: 5.05 MIL/uL (ref 3.87–5.11)
RDW: 12.9 % (ref 11.5–15.5)
WBC Count: 9.4 10*3/uL (ref 4.0–10.5)
nRBC: 0 % (ref 0.0–0.2)

## 2019-06-29 LAB — IRON AND TIBC
Iron: 85 ug/dL (ref 41–142)
Saturation Ratios: 26 % (ref 21–57)
TIBC: 331 ug/dL (ref 236–444)
UIBC: 246 ug/dL (ref 120–384)

## 2019-06-29 LAB — FERRITIN: Ferritin: 19 ng/mL (ref 11–307)

## 2019-06-30 ENCOUNTER — Telehealth: Payer: Self-pay | Admitting: *Deleted

## 2019-06-30 ENCOUNTER — Telehealth: Payer: Self-pay | Admitting: Hematology & Oncology

## 2019-06-30 NOTE — Telephone Encounter (Signed)
Call received from patient requesting lab results from 06/28/19.  Results given to patient and patient requests to be transferred to scheduling.  Pt transferred to scheduling and canceled her upcoming appt on 07/06/19 with Dr. Marin Olp stating that she does not need this appt d/t her iron studies are WNL.

## 2019-06-30 NOTE — Telephone Encounter (Signed)
Patient called to cancel her upcoming appointments for September.  She will call back to reschedule

## 2019-07-05 DIAGNOSIS — R69 Illness, unspecified: Secondary | ICD-10-CM | POA: Diagnosis not present

## 2019-07-06 ENCOUNTER — Inpatient Hospital Stay: Payer: Medicare HMO | Admitting: Hematology & Oncology

## 2019-07-06 ENCOUNTER — Other Ambulatory Visit: Payer: Medicare HMO

## 2019-07-07 DIAGNOSIS — M25562 Pain in left knee: Secondary | ICD-10-CM | POA: Diagnosis not present

## 2019-07-07 DIAGNOSIS — M7042 Prepatellar bursitis, left knee: Secondary | ICD-10-CM | POA: Diagnosis not present

## 2019-07-07 DIAGNOSIS — R52 Pain, unspecified: Secondary | ICD-10-CM | POA: Diagnosis not present

## 2019-07-07 DIAGNOSIS — M1712 Unilateral primary osteoarthritis, left knee: Secondary | ICD-10-CM | POA: Diagnosis not present

## 2019-07-14 DIAGNOSIS — M7042 Prepatellar bursitis, left knee: Secondary | ICD-10-CM | POA: Diagnosis not present

## 2019-07-18 DIAGNOSIS — F902 Attention-deficit hyperactivity disorder, combined type: Secondary | ICD-10-CM | POA: Diagnosis not present

## 2019-07-18 DIAGNOSIS — R69 Illness, unspecified: Secondary | ICD-10-CM | POA: Diagnosis not present

## 2019-08-11 DIAGNOSIS — R69 Illness, unspecified: Secondary | ICD-10-CM | POA: Diagnosis not present

## 2019-09-25 DIAGNOSIS — L03115 Cellulitis of right lower limb: Secondary | ICD-10-CM | POA: Diagnosis not present

## 2019-09-26 DIAGNOSIS — W19XXXD Unspecified fall, subsequent encounter: Secondary | ICD-10-CM | POA: Diagnosis not present

## 2019-09-26 DIAGNOSIS — S80811D Abrasion, right lower leg, subsequent encounter: Secondary | ICD-10-CM | POA: Diagnosis not present

## 2019-10-03 DIAGNOSIS — S81801A Unspecified open wound, right lower leg, initial encounter: Secondary | ICD-10-CM | POA: Diagnosis not present

## 2019-10-03 DIAGNOSIS — M79604 Pain in right leg: Secondary | ICD-10-CM | POA: Diagnosis not present

## 2019-10-03 DIAGNOSIS — E119 Type 2 diabetes mellitus without complications: Secondary | ICD-10-CM | POA: Diagnosis not present

## 2019-10-12 DIAGNOSIS — D2372 Other benign neoplasm of skin of left lower limb, including hip: Secondary | ICD-10-CM | POA: Diagnosis not present

## 2019-10-12 DIAGNOSIS — R21 Rash and other nonspecific skin eruption: Secondary | ICD-10-CM | POA: Diagnosis not present

## 2019-10-12 DIAGNOSIS — D485 Neoplasm of uncertain behavior of skin: Secondary | ICD-10-CM | POA: Diagnosis not present

## 2019-10-12 DIAGNOSIS — L738 Other specified follicular disorders: Secondary | ICD-10-CM | POA: Diagnosis not present

## 2019-10-13 ENCOUNTER — Other Ambulatory Visit: Payer: Self-pay | Admitting: Family

## 2019-10-13 ENCOUNTER — Telehealth: Payer: Self-pay | Admitting: *Deleted

## 2019-10-13 DIAGNOSIS — D509 Iron deficiency anemia, unspecified: Secondary | ICD-10-CM

## 2019-10-13 NOTE — Telephone Encounter (Signed)
Message received from patient requesting to come in for iron/tibc and ferritin levels.  Jory Ee NP notified and order received for pt to come in for labs and to see Judson Roch.  Message sent to scheduling.

## 2019-10-16 ENCOUNTER — Inpatient Hospital Stay: Payer: Medicare HMO

## 2019-10-17 ENCOUNTER — Inpatient Hospital Stay: Payer: Medicare HMO | Attending: Family

## 2019-10-17 ENCOUNTER — Other Ambulatory Visit: Payer: Self-pay

## 2019-10-17 DIAGNOSIS — R21 Rash and other nonspecific skin eruption: Secondary | ICD-10-CM | POA: Diagnosis not present

## 2019-10-17 DIAGNOSIS — E119 Type 2 diabetes mellitus without complications: Secondary | ICD-10-CM | POA: Diagnosis not present

## 2019-10-17 DIAGNOSIS — E1121 Type 2 diabetes mellitus with diabetic nephropathy: Secondary | ICD-10-CM | POA: Diagnosis not present

## 2019-10-17 DIAGNOSIS — Z79899 Other long term (current) drug therapy: Secondary | ICD-10-CM | POA: Insufficient documentation

## 2019-10-17 DIAGNOSIS — Z01419 Encounter for gynecological examination (general) (routine) without abnormal findings: Secondary | ICD-10-CM | POA: Diagnosis not present

## 2019-10-17 DIAGNOSIS — M3500 Sicca syndrome, unspecified: Secondary | ICD-10-CM | POA: Diagnosis not present

## 2019-10-17 DIAGNOSIS — D509 Iron deficiency anemia, unspecified: Secondary | ICD-10-CM | POA: Insufficient documentation

## 2019-10-17 DIAGNOSIS — N6001 Solitary cyst of right breast: Secondary | ICD-10-CM | POA: Diagnosis not present

## 2019-10-17 LAB — CMP (CANCER CENTER ONLY)
ALT: 18 U/L (ref 0–44)
AST: 11 U/L — ABNORMAL LOW (ref 15–41)
Albumin: 4.2 g/dL (ref 3.5–5.0)
Alkaline Phosphatase: 79 U/L (ref 38–126)
Anion gap: 7 (ref 5–15)
BUN: 15 mg/dL (ref 6–20)
CO2: 30 mmol/L (ref 22–32)
Calcium: 9.1 mg/dL (ref 8.9–10.3)
Chloride: 100 mmol/L (ref 98–111)
Creatinine: 0.71 mg/dL (ref 0.44–1.00)
GFR, Est AFR Am: >60 mL/min (ref >60)
GFR, Estimated: >60 mL/min (ref >60)
Glucose, Bld: 214 mg/dL — ABNORMAL HIGH (ref 70–99)
Potassium: 3.6 mmol/L (ref 3.5–5.1)
Sodium: 137 mmol/L (ref 135–145)
Total Bilirubin: 0.3 mg/dL (ref 0.3–1.2)
Total Protein: 7.2 g/dL (ref 6.5–8.1)

## 2019-10-17 LAB — CBC WITH DIFFERENTIAL (CANCER CENTER ONLY)
Abs Immature Granulocytes: 0.05 10*3/uL (ref 0.00–0.07)
Basophils Absolute: 0.1 10*3/uL (ref 0.0–0.1)
Basophils Relative: 0 %
Eosinophils Absolute: 0 10*3/uL (ref 0.0–0.5)
Eosinophils Relative: 0 %
HCT: 44.8 % (ref 36.0–46.0)
Hemoglobin: 14.4 g/dL (ref 12.0–15.0)
Immature Granulocytes: 0 %
Lymphocytes Relative: 25 %
Lymphs Abs: 3.5 10*3/uL (ref 0.7–4.0)
MCH: 28 pg (ref 26.0–34.0)
MCHC: 32.1 g/dL (ref 30.0–36.0)
MCV: 87 fL (ref 80.0–100.0)
Monocytes Absolute: 1.3 10*3/uL — ABNORMAL HIGH (ref 0.1–1.0)
Monocytes Relative: 9 %
Neutro Abs: 9.1 10*3/uL — ABNORMAL HIGH (ref 1.7–7.7)
Neutrophils Relative %: 66 %
Platelet Count: 343 10*3/uL (ref 150–400)
RBC: 5.15 MIL/uL — ABNORMAL HIGH (ref 3.87–5.11)
RDW: 12.6 % (ref 11.5–15.5)
WBC Count: 14 10*3/uL — ABNORMAL HIGH (ref 4.0–10.5)
nRBC: 0 % (ref 0.0–0.2)

## 2019-10-18 ENCOUNTER — Inpatient Hospital Stay (HOSPITAL_BASED_OUTPATIENT_CLINIC_OR_DEPARTMENT_OTHER): Payer: Medicare HMO | Admitting: Family

## 2019-10-18 ENCOUNTER — Other Ambulatory Visit: Payer: Self-pay | Admitting: Obstetrics and Gynecology

## 2019-10-18 DIAGNOSIS — N6310 Unspecified lump in the right breast, unspecified quadrant: Secondary | ICD-10-CM

## 2019-10-18 DIAGNOSIS — D509 Iron deficiency anemia, unspecified: Secondary | ICD-10-CM | POA: Diagnosis not present

## 2019-10-18 DIAGNOSIS — E119 Type 2 diabetes mellitus without complications: Secondary | ICD-10-CM | POA: Diagnosis not present

## 2019-10-18 DIAGNOSIS — R21 Rash and other nonspecific skin eruption: Secondary | ICD-10-CM | POA: Diagnosis not present

## 2019-10-18 DIAGNOSIS — R238 Other skin changes: Secondary | ICD-10-CM | POA: Diagnosis not present

## 2019-10-18 DIAGNOSIS — L905 Scar conditions and fibrosis of skin: Secondary | ICD-10-CM | POA: Diagnosis not present

## 2019-10-18 DIAGNOSIS — Z79899 Other long term (current) drug therapy: Secondary | ICD-10-CM | POA: Diagnosis not present

## 2019-10-18 DIAGNOSIS — M3500 Sicca syndrome, unspecified: Secondary | ICD-10-CM | POA: Diagnosis not present

## 2019-10-18 LAB — IRON AND TIBC
Iron: 85 ug/dL (ref 41–142)
Saturation Ratios: 27 % (ref 21–57)
TIBC: 316 ug/dL (ref 236–444)
UIBC: 231 ug/dL (ref 120–384)

## 2019-10-18 LAB — FERRITIN: Ferritin: 20 ng/mL (ref 11–307)

## 2019-10-18 NOTE — Progress Notes (Signed)
Hematology and Oncology Follow Up Visit  [name] [id number] 01-13-1971 48 y.o. 10/18/2019   Principle Diagnosis:  Iron deficiency anemia  Current Therapy: IV iron as indicated    Interim History: [name] had a telephone visit today for follow-up. She is having a flare with her Sjogren's syndrome as well as a rash (possibly Lupus) all over her arms and legs. She states that she had a biopsy and is waiting on results.  She has a virtual visit scheduled with Rheumatology coming up but she is looking for a more local provider.  She is currently on Plaquenil but states that she may need to change to Imuran.  No episodes of bleeding.  No fever, chills, n/v, cough, dizziness, SOB, chest pain, palpitations, abdominal pain or changes in bowel or bladder habits.  No swelling, numbness or tingling in her extremities at this time.  No falls or syncope.  Her appetite comes and goes. She admits that she needs to better hydrate throughout the day. She states that her weight is stable.   ECOG Performance Status: 1 - Symptomatic but completely ambulatory  Medications:  Allergies as of 10/18/2019      Reactions   Penicillins    REACTION: hives   Sulfonamide Derivatives    REACTION: hives      Medication List       Accurate as of October 18, 2019  1:31 PM. If you have any questions, ask your nurse or doctor.        atorvastatin 20 MG tablet Commonly known as: LIPITOR Take 20 mg by mouth daily.   buPROPion 300 MG 24 hr tablet Commonly known as: WELLBUTRIN XL Take 300 mg by mouth daily.   clonazePAM 1 MG tablet Commonly known as: KLONOPIN Take 1 mg by mouth 4 (four) times daily.   DULoxetine 60 MG capsule Commonly known as: CYMBALTA Take 60 mg daily by mouth.   hydroxychloroquine 200 MG tablet Commonly known as: PLAQUENIL Take 400 mg by mouth daily.   Linzess 290 MCG Caps capsule Generic drug: linaclotide Take 290 mcg daily before breakfast by mouth.     lisdexamfetamine 70 MG capsule Commonly known as: VYVANSE Take 70 mg by mouth 1 day or 1 dose.   lisinopril 5 MG tablet Commonly known as: ZESTRIL Take 5 mg daily by mouth.   naproxen 500 MG tablet Commonly known as: NAPROSYN Take 500 mg by mouth 2 (two) times daily as needed.   omeprazole 40 MG capsule Commonly known as: PRILOSEC Take 1 capsule (40 mg total) by mouth daily.   ondansetron 8 MG disintegrating tablet Commonly known as: ZOFRAN-ODT Take 1 tablet (8 mg total) by mouth every 6 (six) hours as needed for nausea or vomiting.   polyethylene glycol 17 g packet Commonly known as: MIRALAX / GLYCOLAX Take 17 g by mouth daily.   promethazine 25 MG tablet Commonly known as: PHENERGAN TAKE 1 TABLET(25 MG) BY MOUTH EVERY 8 HOURS AS NEEDED FOR NAUSEA OR VOMITING   sitaGLIPtin 50 MG tablet Commonly known as: JANUVIA Take 50 mg by mouth.       Allergies:  Allergies  Allergen Reactions  . Penicillins     REACTION: hives  . Sulfonamide Derivatives     REACTION: hives    Past Medical History, Surgical history, Social history, and Family History were reviewed and updated.  Review of Systems: All other 10 point review of systems is negative.   Physical Exam:  vitals were not taken for this visit.  Wt Readings from Last 3 Encounters:  04/04/19 287 lb (130.2 kg)  11/30/18 268 lb 4 oz (121.7 kg)  11/18/17 255 lb (115.7 kg)    Lab Results  Component Value Date   WBC 14.0 (H) 10/17/2019   HGB 14.4 10/17/2019   HCT 44.8 10/17/2019   MCV 87.0 10/17/2019   PLT 343 10/17/2019   Lab Results  Component Value Date   FERRITIN 20 10/17/2019   IRON 85 10/17/2019   TIBC 316 10/17/2019   UIBC 231 10/17/2019   IRONPCTSAT 27 10/17/2019   Lab Results  Component Value Date   RETICCTPCT 1.4 11/30/2018   RBC 5.15 (H) 10/17/2019   No results found for: KPAFRELGTCHN, LAMBDASER, KAPLAMBRATIO No results found for: IGGSERUM, IGA, IGMSERUM No results found for:  [name], SPEI   Chemistry      Component Value Date/Time   NA 137 10/17/2019 1404   K 3.6 10/17/2019 1404   CL 100 10/17/2019 1404   CO2 30 10/17/2019 1404   BUN 15 10/17/2019 1404   CREATININE 0.71 10/17/2019 1404      Component Value Date/Time   CALCIUM 9.1 10/17/2019 1404   ALKPHOS 79 10/17/2019 1404   AST 11 (L) 10/17/2019 1404   ALT 18 10/17/2019 1404   BILITOT 0.3 10/17/2019 1404       Impression and Plan: [name] is a very pleasant 48 yo caucasian female withhistoryiron deficiency anemia as well as several autoimmune issues including Sjogren's, fibromyalgia and type II diabetes.  Her iron studies are stable and she does not need IV iron at this time.  We will see her back in another 6 months.  She will contact our office with any questions or concerns. We can certainly see her sooner if needed.   Laverna Peace, NP 12/16/20201:31 PM

## 2019-10-19 ENCOUNTER — Telehealth: Payer: Self-pay | Admitting: Family

## 2019-10-19 DIAGNOSIS — R801 Persistent proteinuria, unspecified: Secondary | ICD-10-CM | POA: Diagnosis not present

## 2019-10-19 DIAGNOSIS — I10 Essential (primary) hypertension: Secondary | ICD-10-CM | POA: Diagnosis not present

## 2019-10-19 DIAGNOSIS — E1121 Type 2 diabetes mellitus with diabetic nephropathy: Secondary | ICD-10-CM | POA: Diagnosis not present

## 2019-10-19 DIAGNOSIS — Z6841 Body Mass Index (BMI) 40.0 and over, adult: Secondary | ICD-10-CM | POA: Diagnosis not present

## 2019-10-19 NOTE — Telephone Encounter (Signed)
Tried calling patient unable to reach her by phone.  Letter/Calendar mailed per 12/16 los

## 2019-10-25 ENCOUNTER — Other Ambulatory Visit: Payer: Medicare HMO

## 2019-10-25 DIAGNOSIS — B86 Scabies: Secondary | ICD-10-CM | POA: Diagnosis not present

## 2019-10-25 DIAGNOSIS — L281 Prurigo nodularis: Secondary | ICD-10-CM | POA: Diagnosis not present

## 2019-10-25 DIAGNOSIS — M352 Behcet's disease: Secondary | ICD-10-CM | POA: Diagnosis not present

## 2019-10-25 DIAGNOSIS — L905 Scar conditions and fibrosis of skin: Secondary | ICD-10-CM | POA: Diagnosis not present

## 2019-11-17 ENCOUNTER — Other Ambulatory Visit: Payer: Self-pay | Admitting: Family

## 2019-11-17 ENCOUNTER — Other Ambulatory Visit: Payer: Self-pay

## 2019-11-17 ENCOUNTER — Ambulatory Visit
Admission: RE | Admit: 2019-11-17 | Discharge: 2019-11-17 | Disposition: A | Discharge status: Home / Self Care | Payer: Medicare HMO | Source: Ambulatory Visit | Attending: Obstetrics and Gynecology | Admitting: Obstetrics and Gynecology

## 2019-11-17 DIAGNOSIS — R928 Other abnormal and inconclusive findings on diagnostic imaging of breast: Secondary | ICD-10-CM | POA: Diagnosis not present

## 2019-11-17 DIAGNOSIS — N6310 Unspecified lump in the right breast, unspecified quadrant: Secondary | ICD-10-CM

## 2019-11-17 DIAGNOSIS — N6489 Other specified disorders of breast: Secondary | ICD-10-CM | POA: Diagnosis not present

## 2019-11-27 DIAGNOSIS — I10 Essential (primary) hypertension: Secondary | ICD-10-CM | POA: Diagnosis not present

## 2019-11-27 DIAGNOSIS — D508 Other iron deficiency anemias: Secondary | ICD-10-CM | POA: Diagnosis not present

## 2019-11-27 DIAGNOSIS — E119 Type 2 diabetes mellitus without complications: Secondary | ICD-10-CM | POA: Diagnosis not present

## 2019-11-27 DIAGNOSIS — E7849 Other hyperlipidemia: Secondary | ICD-10-CM | POA: Diagnosis not present

## 2019-11-27 DIAGNOSIS — Z0000 Encounter for general adult medical examination without abnormal findings: Secondary | ICD-10-CM | POA: Diagnosis not present

## 2019-11-29 DIAGNOSIS — R69 Illness, unspecified: Secondary | ICD-10-CM | POA: Diagnosis not present

## 2019-11-29 DIAGNOSIS — F411 Generalized anxiety disorder: Secondary | ICD-10-CM | POA: Diagnosis not present

## 2019-11-29 DIAGNOSIS — Z79891 Long term (current) use of opiate analgesic: Secondary | ICD-10-CM | POA: Diagnosis not present

## 2019-12-01 DIAGNOSIS — K3184 Gastroparesis: Secondary | ICD-10-CM | POA: Diagnosis not present

## 2019-12-01 DIAGNOSIS — E119 Type 2 diabetes mellitus without complications: Secondary | ICD-10-CM | POA: Diagnosis not present

## 2019-12-01 DIAGNOSIS — D508 Other iron deficiency anemias: Secondary | ICD-10-CM | POA: Diagnosis not present

## 2019-12-01 DIAGNOSIS — Z7984 Long term (current) use of oral hypoglycemic drugs: Secondary | ICD-10-CM | POA: Diagnosis not present

## 2019-12-06 DIAGNOSIS — R69 Illness, unspecified: Secondary | ICD-10-CM | POA: Diagnosis not present

## 2019-12-06 DIAGNOSIS — Z79891 Long term (current) use of opiate analgesic: Secondary | ICD-10-CM | POA: Diagnosis not present

## 2019-12-06 DIAGNOSIS — F411 Generalized anxiety disorder: Secondary | ICD-10-CM | POA: Diagnosis not present

## 2019-12-08 DIAGNOSIS — Z79891 Long term (current) use of opiate analgesic: Secondary | ICD-10-CM | POA: Diagnosis not present

## 2019-12-08 DIAGNOSIS — F411 Generalized anxiety disorder: Secondary | ICD-10-CM | POA: Diagnosis not present

## 2019-12-08 DIAGNOSIS — R69 Illness, unspecified: Secondary | ICD-10-CM | POA: Diagnosis not present

## 2019-12-13 DIAGNOSIS — R69 Illness, unspecified: Secondary | ICD-10-CM | POA: Diagnosis not present

## 2019-12-13 DIAGNOSIS — F411 Generalized anxiety disorder: Secondary | ICD-10-CM | POA: Diagnosis not present

## 2019-12-13 DIAGNOSIS — Z79891 Long term (current) use of opiate analgesic: Secondary | ICD-10-CM | POA: Diagnosis not present

## 2019-12-14 DIAGNOSIS — D8989 Other specified disorders involving the immune mechanism, not elsewhere classified: Secondary | ICD-10-CM | POA: Diagnosis not present

## 2019-12-14 DIAGNOSIS — M3500 Sicca syndrome, unspecified: Secondary | ICD-10-CM | POA: Diagnosis not present

## 2019-12-18 DIAGNOSIS — F411 Generalized anxiety disorder: Secondary | ICD-10-CM | POA: Diagnosis not present

## 2019-12-18 DIAGNOSIS — Z79891 Long term (current) use of opiate analgesic: Secondary | ICD-10-CM | POA: Diagnosis not present

## 2019-12-18 DIAGNOSIS — R69 Illness, unspecified: Secondary | ICD-10-CM | POA: Diagnosis not present

## 2019-12-22 DIAGNOSIS — R69 Illness, unspecified: Secondary | ICD-10-CM | POA: Diagnosis not present

## 2019-12-22 DIAGNOSIS — F411 Generalized anxiety disorder: Secondary | ICD-10-CM | POA: Diagnosis not present

## 2019-12-25 DIAGNOSIS — F411 Generalized anxiety disorder: Secondary | ICD-10-CM | POA: Diagnosis not present

## 2019-12-25 DIAGNOSIS — R69 Illness, unspecified: Secondary | ICD-10-CM | POA: Diagnosis not present

## 2019-12-26 DIAGNOSIS — Z79891 Long term (current) use of opiate analgesic: Secondary | ICD-10-CM | POA: Diagnosis not present

## 2019-12-27 DIAGNOSIS — L578 Other skin changes due to chronic exposure to nonionizing radiation: Secondary | ICD-10-CM | POA: Diagnosis not present

## 2019-12-27 DIAGNOSIS — D225 Melanocytic nevi of trunk: Secondary | ICD-10-CM | POA: Diagnosis not present

## 2019-12-27 DIAGNOSIS — D2371 Other benign neoplasm of skin of right lower limb, including hip: Secondary | ICD-10-CM | POA: Diagnosis not present

## 2019-12-27 DIAGNOSIS — L2089 Other atopic dermatitis: Secondary | ICD-10-CM | POA: Diagnosis not present

## 2019-12-29 DIAGNOSIS — R69 Illness, unspecified: Secondary | ICD-10-CM | POA: Diagnosis not present

## 2019-12-29 DIAGNOSIS — F411 Generalized anxiety disorder: Secondary | ICD-10-CM | POA: Diagnosis not present

## 2020-02-05 ENCOUNTER — Encounter: Payer: Self-pay | Admitting: Internal Medicine

## 2020-02-07 DIAGNOSIS — M797 Fibromyalgia: Secondary | ICD-10-CM | POA: Insufficient documentation

## 2020-03-05 ENCOUNTER — Other Ambulatory Visit: Payer: Self-pay | Admitting: Family

## 2020-03-05 ENCOUNTER — Telehealth: Payer: Self-pay | Admitting: *Deleted

## 2020-03-05 NOTE — Telephone Encounter (Signed)
Call received from patient requesting for Laverna Peace NP to review labs drawn from [facility] on 02/21/20.  Labs reviewed by Judson Roch and order received for pt to get 5 doses of  IV Venofer.  Message sent to scheduling.

## 2020-03-08 ENCOUNTER — Inpatient Hospital Stay: Payer: Medicare Other | Attending: Family

## 2020-03-08 ENCOUNTER — Other Ambulatory Visit: Payer: Self-pay

## 2020-03-08 VITALS — BP 145/66 | HR 72 | Temp 97.1°F | Resp 17

## 2020-03-08 DIAGNOSIS — D509 Iron deficiency anemia, unspecified: Secondary | ICD-10-CM | POA: Diagnosis not present

## 2020-03-08 MED ORDER — SODIUM CHLORIDE 0.9 % IV SOLN
Freq: Once | INTRAVENOUS | Status: AC
  Filled 2020-03-08: qty 250

## 2020-03-08 MED ORDER — SODIUM CHLORIDE 0.9 % IV SOLN
200.0000 mg | Freq: Once | INTRAVENOUS | Status: AC
  Administered 2020-03-08: 200 mg via INTRAVENOUS
  Filled 2020-03-08: qty 200

## 2020-03-11 ENCOUNTER — Inpatient Hospital Stay: Payer: Medicare Other

## 2020-03-18 ENCOUNTER — Inpatient Hospital Stay: Payer: Medicare Other

## 2020-03-19 ENCOUNTER — Inpatient Hospital Stay: Payer: Medicare Other

## 2020-03-19 ENCOUNTER — Other Ambulatory Visit: Payer: Self-pay

## 2020-03-19 VITALS — BP 138/69 | HR 88 | Resp 19

## 2020-03-19 DIAGNOSIS — D509 Iron deficiency anemia, unspecified: Secondary | ICD-10-CM

## 2020-03-19 MED ORDER — SODIUM CHLORIDE 0.9 % IV SOLN
200.0000 mg | Freq: Once | INTRAVENOUS | Status: AC
  Administered 2020-03-19: 200 mg via INTRAVENOUS
  Filled 2020-03-19: qty 200

## 2020-03-19 MED ORDER — SODIUM CHLORIDE 0.9 % IV SOLN
Freq: Once | INTRAVENOUS | Status: AC
  Filled 2020-03-19: qty 250

## 2020-03-19 NOTE — Patient Instructions (Signed)

## 2020-03-22 ENCOUNTER — Other Ambulatory Visit: Payer: Self-pay

## 2020-03-22 ENCOUNTER — Inpatient Hospital Stay: Payer: Medicare Other

## 2020-03-22 VITALS — BP 141/75 | HR 85 | Temp 97.5°F | Resp 20

## 2020-03-22 DIAGNOSIS — D509 Iron deficiency anemia, unspecified: Secondary | ICD-10-CM | POA: Diagnosis not present

## 2020-03-22 MED ORDER — SODIUM CHLORIDE 0.9 % IV SOLN
Freq: Once | INTRAVENOUS | Status: AC
  Filled 2020-03-22: qty 250

## 2020-03-22 MED ORDER — SODIUM CHLORIDE 0.9 % IV SOLN
200.0000 mg | Freq: Once | INTRAVENOUS | Status: AC
  Administered 2020-03-22: 200 mg via INTRAVENOUS
  Filled 2020-03-22: qty 200

## 2020-03-22 NOTE — Patient Instructions (Signed)

## 2020-03-25 ENCOUNTER — Inpatient Hospital Stay: Payer: Medicare Other

## 2020-03-28 ENCOUNTER — Inpatient Hospital Stay: Payer: Medicare Other

## 2020-03-28 ENCOUNTER — Other Ambulatory Visit: Payer: Self-pay

## 2020-03-28 VITALS — BP 131/61 | HR 78 | Temp 97.5°F | Resp 18

## 2020-03-28 DIAGNOSIS — D509 Iron deficiency anemia, unspecified: Secondary | ICD-10-CM | POA: Diagnosis not present

## 2020-03-28 MED ORDER — SODIUM CHLORIDE 0.9 % IV SOLN
200.0000 mg | Freq: Once | INTRAVENOUS | Status: AC
  Administered 2020-03-28: 200 mg via INTRAVENOUS
  Filled 2020-03-28: qty 200

## 2020-03-28 MED ORDER — SODIUM CHLORIDE 0.9 % IV SOLN
Freq: Once | INTRAVENOUS | Status: AC
  Filled 2020-03-28: qty 250

## 2020-04-02 ENCOUNTER — Inpatient Hospital Stay: Payer: Medicare Other | Attending: Family

## 2020-04-02 ENCOUNTER — Other Ambulatory Visit: Payer: Self-pay

## 2020-04-02 VITALS — BP 149/81 | HR 85 | Temp 97.7°F | Resp 18

## 2020-04-02 DIAGNOSIS — D509 Iron deficiency anemia, unspecified: Secondary | ICD-10-CM | POA: Diagnosis not present

## 2020-04-02 MED ORDER — SODIUM CHLORIDE 0.9 % IV SOLN
200.0000 mg | Freq: Once | INTRAVENOUS | Status: AC
  Administered 2020-04-02: 200 mg via INTRAVENOUS
  Filled 2020-04-02: qty 200

## 2020-04-02 MED ORDER — SODIUM CHLORIDE 0.9 % IV SOLN
Freq: Once | INTRAVENOUS | Status: AC
  Filled 2020-04-02: qty 250

## 2020-04-05 ENCOUNTER — Encounter: Payer: Medicare HMO | Admitting: Internal Medicine

## 2020-04-17 ENCOUNTER — Inpatient Hospital Stay: Payer: Medicare Other | Admitting: Family

## 2020-04-17 ENCOUNTER — Inpatient Hospital Stay: Payer: Medicare Other

## 2020-04-30 ENCOUNTER — Inpatient Hospital Stay (HOSPITAL_BASED_OUTPATIENT_CLINIC_OR_DEPARTMENT_OTHER): Payer: Medicare Other | Admitting: Family

## 2020-04-30 ENCOUNTER — Telehealth: Payer: Self-pay | Admitting: Family

## 2020-04-30 ENCOUNTER — Inpatient Hospital Stay: Payer: Medicare Other

## 2020-04-30 ENCOUNTER — Encounter: Payer: Self-pay | Admitting: Family

## 2020-04-30 ENCOUNTER — Other Ambulatory Visit: Payer: Self-pay

## 2020-04-30 VITALS — BP 147/87 | HR 84 | Temp 97.9°F | Resp 18 | Ht 66.93 in | Wt 271.0 lb

## 2020-04-30 DIAGNOSIS — E538 Deficiency of other specified B group vitamins: Secondary | ICD-10-CM

## 2020-04-30 DIAGNOSIS — D509 Iron deficiency anemia, unspecified: Secondary | ICD-10-CM

## 2020-04-30 LAB — CMP (CANCER CENTER ONLY)
ALT: 47 U/L — ABNORMAL HIGH (ref 0–44)
AST: 34 U/L (ref 15–41)
Albumin: 4.1 g/dL (ref 3.5–5.0)
Alkaline Phosphatase: 105 U/L (ref 38–126)
Anion gap: 7 (ref 5–15)
BUN: 13 mg/dL (ref 6–20)
CO2: 27 mmol/L (ref 22–32)
Calcium: 9.2 mg/dL (ref 8.9–10.3)
Chloride: 101 mmol/L (ref 98–111)
Creatinine: 0.59 mg/dL (ref 0.44–1.00)
GFR, Est AFR Am: >60 mL/min (ref >60)
GFR, Estimated: >60 mL/min (ref >60)
Glucose, Bld: 205 mg/dL — ABNORMAL HIGH (ref 70–99)
Potassium: 4.2 mmol/L (ref 3.5–5.1)
Sodium: 135 mmol/L (ref 135–145)
Total Bilirubin: 0.4 mg/dL (ref 0.3–1.2)
Total Protein: 7.2 g/dL (ref 6.5–8.1)

## 2020-04-30 LAB — CBC WITH DIFFERENTIAL (CANCER CENTER ONLY)
Abs Immature Granulocytes: 0.09 10*3/uL — ABNORMAL HIGH (ref 0.00–0.07)
Basophils Absolute: 0 10*3/uL (ref 0.0–0.1)
Basophils Relative: 0 %
Eosinophils Absolute: 0 10*3/uL (ref 0.0–0.5)
Eosinophils Relative: 0 %
HCT: 45.6 % (ref 36.0–46.0)
Hemoglobin: 14.7 g/dL (ref 12.0–15.0)
Immature Granulocytes: 1 %
Lymphocytes Relative: 24 %
Lymphs Abs: 1.9 10*3/uL (ref 0.7–4.0)
MCH: 27.3 pg (ref 26.0–34.0)
MCHC: 32.2 g/dL (ref 30.0–36.0)
MCV: 84.6 fL (ref 80.0–100.0)
Monocytes Absolute: 0.8 10*3/uL (ref 0.1–1.0)
Monocytes Relative: 10 %
Neutro Abs: 4.9 10*3/uL (ref 1.7–7.7)
Neutrophils Relative %: 65 %
Platelet Count: 265 10*3/uL (ref 150–400)
RBC: 5.39 MIL/uL — ABNORMAL HIGH (ref 3.87–5.11)
RDW: 13.7 % (ref 11.5–15.5)
WBC Count: 7.7 10*3/uL (ref 4.0–10.5)
nRBC: 0 % (ref 0.0–0.2)

## 2020-04-30 LAB — RETICULOCYTES
Immature Retic Fract: 4.6 % (ref 2.3–15.9)
RBC.: 5.31 MIL/uL — ABNORMAL HIGH (ref 3.87–5.11)
Retic Count, Absolute: 109.9 10*3/uL (ref 19.0–186.0)
Retic Ct Pct: 2.1 % (ref 0.4–3.1)

## 2020-04-30 NOTE — Telephone Encounter (Signed)
Appointments scheduled calendar printed & mailed per 6/29 los

## 2020-04-30 NOTE — Progress Notes (Signed)
Hematology and Oncology Follow Up Visit  [name] [id number] 08-29-1971 49 y.o. 04/30/2020   Principle Diagnosis:  Iron deficiency anemia  Current Therapy: IV iron as indicated    Interim History:  [name] is here today for follow-up. She is feeling fatigued and struggling with a rash on her arms and legs. She states that she has seen 3 dermatologists, had biopsies done and they feel this is likely related to one of her autoimmune issues.  She received 3 doses of Venofer (may/june) and tolerated well.  She is still having a cycle despite having the ablation and states that she is still considering which birthcontrol method she would like to try next (IUD vs. Implanon). She really doesn't want oral contraception or a hysterectomy.  No other blood loss noted. No bruising or petechiae.  No fever, chills, cough, dizziness, SOB, chest pain, palpitations, abdominal pain or changes in bowel or bladder habits.  No swelling, tenderness, numbness or tingling in her extremities.  No falls or syncopal episodes to report.  She states that she does not have much of an appetite but is doing her best to stay well hydrated. Her weight is stable.   ECOG Performance Status: 1 - Symptomatic but completely ambulatory  Medications:  Allergies as of 04/30/2020      Reactions   Penicillins    REACTION: hives   Sulfa Antibiotics    Sulfonamide Derivatives    REACTION: hives      Medication List       Accurate as of April 30, 2020  2:30 PM. If you have any questions, ask your nurse or doctor.        atorvastatin 20 MG tablet Commonly known as: LIPITOR Take 20 mg by mouth daily.   buPROPion 300 MG 24 hr tablet Commonly known as: WELLBUTRIN XL Take 300 mg by mouth daily.   clonazePAM 1 MG tablet Commonly known as: KLONOPIN Take 1 mg by mouth 4 (four) times daily.   DULoxetine 60 MG capsule Commonly known as: CYMBALTA Take 60 mg daily by mouth.   hydroxychloroquine 200 MG  tablet Commonly known as: PLAQUENIL Take 400 mg by mouth daily.   Linzess 290 MCG Caps capsule Generic drug: linaclotide Take 290 mcg daily before breakfast by mouth.   lisdexamfetamine 70 MG capsule Commonly known as: VYVANSE Take 70 mg by mouth 1 day or 1 dose.   lisinopril 5 MG tablet Commonly known as: ZESTRIL Take 5 mg daily by mouth.   naproxen 500 MG tablet Commonly known as: NAPROSYN Take 500 mg by mouth 2 (two) times daily as needed.   omeprazole 40 MG capsule Commonly known as: PRILOSEC Take 1 capsule (40 mg total) by mouth daily.   ondansetron 8 MG disintegrating tablet Commonly known as: ZOFRAN-ODT Take 1 tablet (8 mg total) by mouth every 6 (six) hours as needed for nausea or vomiting.   polyethylene glycol 17 g packet Commonly known as: MIRALAX / GLYCOLAX Take 17 g by mouth daily.   promethazine 25 MG tablet Commonly known as: PHENERGAN TAKE 1 TABLET(25 MG) BY MOUTH EVERY 8 HOURS AS NEEDED FOR NAUSEA OR VOMITING   sitaGLIPtin 50 MG tablet Commonly known as: JANUVIA Take 50 mg by mouth.       Allergies:  Allergies  Allergen Reactions   Penicillins     REACTION: hives   Sulfa Antibiotics    Sulfonamide Derivatives     REACTION: hives    Past Medical History, Surgical history, Social history,  and Family History were reviewed and updated.  Review of Systems: All other 10 point review of systems is negative.   Physical Exam:  vitals were not taken for this visit.   Wt Readings from Last 3 Encounters:  04/04/19 287 lb (130.2 kg)  11/30/18 268 lb 4 oz (121.7 kg)  11/18/17 255 lb (115.7 kg)    Ocular: Sclerae unicteric, pupils equal, round and reactive to light Ear-nose-throat: Oropharynx clear, dentition fair Lymphatic: No cervical or supraclavicular adenopathy Lungs no rales or rhonchi, good excursion bilaterally Heart regular rate and rhythm, no murmur appreciated Abd soft, nontender, positive bowel sounds, no liver or spleen tip  palpated on exam, no fluid wave  MSK no focal spinal tenderness, no joint edema Neuro: non-focal, well-oriented, appropriate affect Breasts: Deferred   Lab Results  Component Value Date   WBC 7.7 04/30/2020   HGB 14.7 04/30/2020   HCT 45.6 04/30/2020   MCV 84.6 04/30/2020   PLT 265 04/30/2020   Lab Results  Component Value Date   FERRITIN 20 10/17/2019   IRON 85 10/17/2019   TIBC 316 10/17/2019   UIBC 231 10/17/2019   IRONPCTSAT 27 10/17/2019   Lab Results  Component Value Date   RETICCTPCT 2.1 04/30/2020   RBC 5.31 (H) 04/30/2020   RBC 5.39 (H) 04/30/2020   No results found for: KPAFRELGTCHN, LAMBDASER, KAPLAMBRATIO No results found for: IGGSERUM, IGA, IGMSERUM No results found for: Kathrynn Ducking, MSPIKE, SPEI   Chemistry      Component Value Date/Time   NA 137 10/17/2019 1404   K 3.6 10/17/2019 1404   CL 100 10/17/2019 1404   CO2 30 10/17/2019 1404   BUN 15 10/17/2019 1404   CREATININE 0.71 10/17/2019 1404      Component Value Date/Time   CALCIUM 9.1 10/17/2019 1404   ALKPHOS 79 10/17/2019 1404   AST 11 (L) 10/17/2019 1404   ALT 18 10/17/2019 1404   BILITOT 0.3 10/17/2019 1404       Impression and Plan: [name] is a very pleasant 49 yo caucasian female withhistoryiron deficiency anemiaas well asseveral autoimmune issues including Sjogren's, fibromyalgia and type II diabetes.  Iron studies are pending. We will replace if needed.  We will see her back in another 6 months.  She will contact our office with any questions or concerns. We can certainly see her sooner if needed.   Laverna Peace, NP 6/29/20212:30 PM

## 2020-05-01 LAB — IRON AND TIBC
Iron: 77 ug/dL (ref 41–142)
Saturation Ratios: 27 % (ref 21–57)
TIBC: 281 ug/dL (ref 236–444)
UIBC: 205 ug/dL (ref 120–384)

## 2020-05-01 LAB — FERRITIN: Ferritin: 182 ng/mL (ref 11–307)

## 2020-05-02 ENCOUNTER — Telehealth: Payer: Self-pay | Admitting: *Deleted

## 2020-05-02 NOTE — Telephone Encounter (Signed)
Message received from patient stating that she is feeling bad and would like to know her iron results from 04/30/20.  Call placed back to patient and patient notified per order of S. [place] NP that lab results are ok and to contact her PCP regarding feeling bad.  Pt appreciative of call back and has no further questions at this time.

## 2020-06-12 LAB — HM COLONOSCOPY

## 2020-09-25 ENCOUNTER — Telehealth: Payer: Self-pay

## 2020-09-25 NOTE — Telephone Encounter (Signed)
Called and left a message with a new appt for 10/29/20 as Dr PE will be out of the office on 10/30/20... AOM

## 2020-10-29 ENCOUNTER — Other Ambulatory Visit: Payer: Self-pay

## 2020-10-29 ENCOUNTER — Encounter: Payer: Self-pay | Admitting: Hematology & Oncology

## 2020-10-29 ENCOUNTER — Inpatient Hospital Stay: Payer: Medicare Other | Attending: Hematology & Oncology

## 2020-10-29 ENCOUNTER — Inpatient Hospital Stay (HOSPITAL_BASED_OUTPATIENT_CLINIC_OR_DEPARTMENT_OTHER): Payer: Medicare Other | Admitting: Hematology & Oncology

## 2020-10-29 VITALS — BP 143/80 | HR 78 | Temp 99.4°F | Resp 18 | Wt 268.2 lb

## 2020-10-29 DIAGNOSIS — M797 Fibromyalgia: Secondary | ICD-10-CM | POA: Insufficient documentation

## 2020-10-29 DIAGNOSIS — M3500 Sicca syndrome, unspecified: Secondary | ICD-10-CM | POA: Diagnosis not present

## 2020-10-29 DIAGNOSIS — E118 Type 2 diabetes mellitus with unspecified complications: Secondary | ICD-10-CM | POA: Insufficient documentation

## 2020-10-29 DIAGNOSIS — D500 Iron deficiency anemia secondary to blood loss (chronic): Secondary | ICD-10-CM | POA: Diagnosis not present

## 2020-10-29 DIAGNOSIS — E538 Deficiency of other specified B group vitamins: Secondary | ICD-10-CM

## 2020-10-29 DIAGNOSIS — D509 Iron deficiency anemia, unspecified: Secondary | ICD-10-CM | POA: Diagnosis not present

## 2020-10-29 DIAGNOSIS — Z79899 Other long term (current) drug therapy: Secondary | ICD-10-CM | POA: Insufficient documentation

## 2020-10-29 DIAGNOSIS — R21 Rash and other nonspecific skin eruption: Secondary | ICD-10-CM | POA: Insufficient documentation

## 2020-10-29 LAB — RETICULOCYTES
Immature Retic Fract: 5.1 % (ref 2.3–15.9)
RBC.: 5.4 MIL/uL — ABNORMAL HIGH (ref 3.87–5.11)
Retic Count, Absolute: 96.7 10*3/uL (ref 19.0–186.0)
Retic Ct Pct: 1.8 % (ref 0.4–3.1)

## 2020-10-29 LAB — CBC WITH DIFFERENTIAL (CANCER CENTER ONLY)
Abs Immature Granulocytes: 0.11 10*3/uL — ABNORMAL HIGH (ref 0.00–0.07)
Basophils Absolute: 0 10*3/uL (ref 0.0–0.1)
Basophils Relative: 0 %
Eosinophils Absolute: 0.2 10*3/uL (ref 0.0–0.5)
Eosinophils Relative: 2 %
HCT: 46.3 % — ABNORMAL HIGH (ref 36.0–46.0)
Hemoglobin: 15.4 g/dL — ABNORMAL HIGH (ref 12.0–15.0)
Immature Granulocytes: 1 %
Lymphocytes Relative: 35 %
Lymphs Abs: 3.3 10*3/uL (ref 0.7–4.0)
MCH: 28.4 pg (ref 26.0–34.0)
MCHC: 33.3 g/dL (ref 30.0–36.0)
MCV: 85.3 fL (ref 80.0–100.0)
Monocytes Absolute: 0.7 10*3/uL (ref 0.1–1.0)
Monocytes Relative: 7 %
Neutro Abs: 5.1 10*3/uL (ref 1.7–7.7)
Neutrophils Relative %: 55 %
Platelet Count: 279 10*3/uL (ref 150–400)
RBC: 5.43 MIL/uL — ABNORMAL HIGH (ref 3.87–5.11)
RDW: 12.4 % (ref 11.5–15.5)
WBC Count: 9.3 10*3/uL (ref 4.0–10.5)
nRBC: 0 % (ref 0.0–0.2)

## 2020-10-29 LAB — FOLATE: Folate: 7.8 ng/mL (ref >5.9)

## 2020-10-29 LAB — CMP (CANCER CENTER ONLY)
ALT: 41 U/L (ref 0–44)
AST: 26 U/L (ref 15–41)
Albumin: 4.3 g/dL (ref 3.5–5.0)
Alkaline Phosphatase: 92 U/L (ref 38–126)
Anion gap: 8 (ref 5–15)
BUN: 12 mg/dL (ref 6–20)
CO2: 26 mmol/L (ref 22–32)
Calcium: 10 mg/dL (ref 8.9–10.3)
Chloride: 100 mmol/L (ref 98–111)
Creatinine: 0.74 mg/dL (ref 0.44–1.00)
GFR, Estimated: >60 mL/min (ref >60)
Glucose, Bld: 123 mg/dL — ABNORMAL HIGH (ref 70–99)
Potassium: 4.3 mmol/L (ref 3.5–5.1)
Sodium: 134 mmol/L — ABNORMAL LOW (ref 135–145)
Total Bilirubin: 0.4 mg/dL (ref 0.3–1.2)
Total Protein: 7.3 g/dL (ref 6.5–8.1)

## 2020-10-29 LAB — VITAMIN B12: Vitamin B-12: 236 pg/mL (ref 180–914)

## 2020-10-29 NOTE — Progress Notes (Signed)
Hematology and Oncology Follow Up Visit  [name] [id number] 1971/06/30 49 y.o. 10/29/2020   Principle Diagnosis:  Iron deficiency anemia  Current Therapy: IV iron as indicated    Interim History:  [name] is here today for follow-up.   This is the first time that I am seeing her.  She does have a complicated history.  She has is undefined autoimmune issue.  She has this rash.  She says that the rash has been diagnosed as small fiber neuropathy rash.  I actually have heard of this.  Again, it may be related to autoimmune issues.  She was last seen 6 months ago.  She does have other doctors that she does see.  She showed me pictures of her rash when it flares up.  I must say that it is certainly significant.  When she was last here in June, her iron studies showed a ferritin of 182 with an iron saturation of 27%.  She has had no problems with respect to her monthly cycles.  She still has some bleeding.  I think she has had an ablation.  She has had no fever.  She has had no issues with the coronavirus.  There has been no leg swelling.  She has had no nausea or vomiting.  Today, her vitamin B12 level was 236.  Her folate was 7.8.    She did show pictures of her 2 [place] shepherds.  I was very impressed with this.  Overall, her performance status is ECOG 1.  Medications:  Allergies as of 10/29/2020      Reactions   Penicillins Hives   Sulfa Antibiotics Hives   Sulfonamide Derivatives Hives      Medication List       Accurate as of October 29, 2020  2:19 PM. If you have any questions, ask your nurse or doctor.        atorvastatin 20 MG tablet Commonly known as: LIPITOR Take 20 mg by mouth daily.   atorvastatin 40 MG tablet Commonly known as: LIPITOR Take 40 mg by mouth daily.   buPROPion 150 MG 24 hr tablet Commonly known as: WELLBUTRIN XL Take 450 mg by mouth every morning.   buPROPion 300 MG 24 hr tablet Commonly known as: WELLBUTRIN XL Take 300  mg by mouth daily.   clobetasol cream 0.05 % Commonly known as: TEMOVATE Use topically twice daily to thickest lesions only & never to face   clonazePAM 1 MG tablet Commonly known as: KLONOPIN Take 1 mg by mouth 4 (four) times daily.   colchicine 0.6 MG tablet Take by mouth.   desonide 0.05 % ointment Commonly known as: DESOWEN Apply topically.   DULoxetine 60 MG capsule Commonly known as: CYMBALTA Take 60 mg daily by mouth.   hydroxychloroquine 200 MG tablet Commonly known as: PLAQUENIL Take 400 mg by mouth daily.   lamoTRIgine 200 MG tablet Commonly known as: LAMICTAL 1 tablet   Latuda 60 MG Tabs Generic drug: Lurasidone HCl Take by mouth.   lidocaine 5 % ointment Commonly known as: XYLOCAINE Apply topically.   linaclotide 290 MCG Caps capsule Commonly known as: LINZESS Take 290 mcg by mouth daily before breakfast.   lisdexamfetamine 70 MG capsule Commonly known as: VYVANSE Take 70 mg by mouth 1 day or 1 dose.   lisinopril 5 MG tablet Commonly known as: ZESTRIL Take 5 mg by mouth daily.   losartan 100 MG tablet Commonly known as: COZAAR Take by mouth.   naproxen 500 MG  tablet Commonly known as: NAPROSYN Take 500 mg by mouth 2 (two) times daily as needed.   omeprazole 40 MG capsule Commonly known as: PRILOSEC Take 1 capsule (40 mg total) by mouth daily.   ondansetron 8 MG disintegrating tablet Commonly known as: ZOFRAN-ODT Take 1 tablet (8 mg total) by mouth every 6 (six) hours as needed for nausea or vomiting.   polyethylene glycol 17 g packet Commonly known as: MIRALAX / GLYCOLAX Take 17 g by mouth daily.   promethazine 25 MG tablet Commonly known as: PHENERGAN TAKE 1 TABLET(25 MG) BY MOUTH EVERY 8 HOURS AS NEEDED FOR NAUSEA OR VOMITING   silver sulfADIAZINE 1 % cream Commonly known as: SILVADENE Apply topically.   sitaGLIPtin 50 MG tablet Commonly known as: JANUVIA Take 50 mg by mouth.   Trulicity 0.75 MG/0.5ML Sopn Generic drug:  Dulaglutide Inject into the skin.       Allergies:  Allergies  Allergen Reactions  . Penicillins Hives  . Sulfa Antibiotics Hives  . Sulfonamide Derivatives Hives    Past Medical History, Surgical history, Social history, and Family History were reviewed and updated.  Review of Systems: Review of Systems  Constitutional: Positive for malaise/fatigue.  HENT: Negative.   Eyes: Negative.   Respiratory: Negative.   Cardiovascular: Negative.   Gastrointestinal: Negative.   Genitourinary: Negative.   Musculoskeletal: Negative.   Skin: Positive for rash.  Neurological: Negative.   Endo/Heme/Allergies: Negative.   Psychiatric/Behavioral: Negative.       weight is 268 lb 4 oz (121.7 kg). Her oral temperature is 99.4 F (37.4 C). Her blood pressure is 143/80 (abnormal) and her pulse is 78. Her respiration is 18 and oxygen saturation is 97%.   Wt Readings from Last 3 Encounters:  10/29/20 268 lb 4 oz (121.7 kg)  04/30/20 271 lb (122.9 kg)  04/04/19 287 lb (130.2 kg)    Physical Exam Vitals reviewed.  HENT:     Head: Normocephalic and atraumatic.     Mouth/Throat:     Mouth: Oropharynx is clear and moist.  Eyes:     Extraocular Movements: EOM normal.     Pupils: Pupils are equal, round, and reactive to light.  Cardiovascular:     Rate and Rhythm: Normal rate and regular rhythm.     Heart sounds: Normal heart sounds.  Pulmonary:     Effort: Pulmonary effort is normal.     Breath sounds: Normal breath sounds.  Abdominal:     General: Bowel sounds are normal.     Palpations: Abdomen is soft.  Musculoskeletal:        General: No tenderness, deformity or edema. Normal range of motion.     Cervical back: Normal range of motion.  Lymphadenopathy:     Cervical: No cervical adenopathy.  Skin:    General: Skin is warm and dry.     Findings: No erythema or rash.     Comments: On her skin she does have this faint macular rash.  There is no excoriations.  It is not blanch.   Neurological:     Mental Status: She is alert and oriented to person, place, and time.  Psychiatric:        Mood and Affect: Mood and affect normal.        Behavior: Behavior normal.        Thought Content: Thought content normal.        Judgment: Judgment normal.      Lab Results  Component Value Date  WBC 9.3 10/29/2020   HGB 15.4 (H) 10/29/2020   HCT 46.3 (H) 10/29/2020   MCV 85.3 10/29/2020   PLT 279 10/29/2020   Lab Results  Component Value Date   FERRITIN 182 04/30/2020   IRON 77 04/30/2020   TIBC 281 04/30/2020   UIBC 205 04/30/2020   IRONPCTSAT 27 04/30/2020   Lab Results  Component Value Date   RETICCTPCT 1.8 10/29/2020   RBC 5.43 (H) 10/29/2020   RBC 5.40 (H) 10/29/2020   No results found for: KPAFRELGTCHN, LAMBDASER, KAPLAMBRATIO No results found for: IGGSERUM, IGA, IGMSERUM No results found for: Ronnald Ramp, A1GS, A2GS, Violet Baldy, MSPIKE, SPEI   Chemistry      Component Value Date/Time   NA 135 04/30/2020 1400   K 4.2 04/30/2020 1400   CL 101 04/30/2020 1400   CO2 27 04/30/2020 1400   BUN 13 04/30/2020 1400   CREATININE 0.59 04/30/2020 1400      Component Value Date/Time   CALCIUM 9.2 04/30/2020 1400   ALKPHOS 105 04/30/2020 1400   AST 34 04/30/2020 1400   ALT 47 (H) 04/30/2020 1400   BILITOT 0.4 04/30/2020 1400       Impression and Plan: Ms. Brohl is a very pleasant 49 yo caucasian female withhistoryiron deficiency anemiaas well asseveral autoimmune issues including Sjogren's, fibromyalgia and type II diabetes.   This is probably the best that her blood count has been.  I am very happy about this.  Her MCV is 85.  So, it is possible that we may have a little bit of a low iron.  I just a more interested in this rash that she has.  Again, maybe this small fiber neuropathy rash.  She says she has been on some medicine for it.  Says she has been getting ketamine infusions.  She said this was helping.  I must say I  have not heard of ketamine infusions.  However, I am sure that the have a place in therapy.  She was incredibly interesting to talk to.  I just feel bad for all this autoimmune issue that she is dealing with.  We will plan for another follow-up in 6 months.    Volanda Napoleon, MD 12/28/20212:19 PM

## 2020-10-30 ENCOUNTER — Other Ambulatory Visit: Payer: Medicare Other

## 2020-10-30 ENCOUNTER — Ambulatory Visit: Payer: Medicare Other | Admitting: Hematology & Oncology

## 2020-10-30 ENCOUNTER — Encounter: Payer: Self-pay | Admitting: *Deleted

## 2020-10-30 LAB — IRON AND TIBC
Iron: 124 ug/dL (ref 41–142)
Saturation Ratios: 38 % (ref 21–57)
TIBC: 321 ug/dL (ref 236–444)
UIBC: 198 ug/dL (ref 120–384)

## 2020-10-30 LAB — FERRITIN: Ferritin: 116 ng/mL (ref 11–307)

## 2021-04-03 ENCOUNTER — Telehealth: Payer: Self-pay

## 2021-04-03 NOTE — Telephone Encounter (Signed)
returned pts call to r/s her appt due to a conflict  [name]

## 2021-04-30 ENCOUNTER — Other Ambulatory Visit: Payer: Medicare Other

## 2021-04-30 ENCOUNTER — Ambulatory Visit: Payer: Medicare Other | Admitting: Hematology & Oncology

## 2021-05-19 ENCOUNTER — Encounter: Payer: Self-pay | Admitting: Hematology & Oncology

## 2021-05-19 ENCOUNTER — Inpatient Hospital Stay: Payer: Medicare (Managed Care) | Attending: Hematology & Oncology

## 2021-05-19 ENCOUNTER — Inpatient Hospital Stay (HOSPITAL_BASED_OUTPATIENT_CLINIC_OR_DEPARTMENT_OTHER): Payer: Medicare (Managed Care) | Admitting: Hematology & Oncology

## 2021-05-19 ENCOUNTER — Other Ambulatory Visit: Payer: Self-pay

## 2021-05-19 VITALS — BP 148/76 | HR 81 | Temp 99.1°F | Resp 20 | Wt 259.1 lb

## 2021-05-19 DIAGNOSIS — D509 Iron deficiency anemia, unspecified: Secondary | ICD-10-CM | POA: Insufficient documentation

## 2021-05-19 DIAGNOSIS — D500 Iron deficiency anemia secondary to blood loss (chronic): Secondary | ICD-10-CM

## 2021-05-19 DIAGNOSIS — E118 Type 2 diabetes mellitus with unspecified complications: Secondary | ICD-10-CM | POA: Diagnosis not present

## 2021-05-19 DIAGNOSIS — M3500 Sicca syndrome, unspecified: Secondary | ICD-10-CM | POA: Insufficient documentation

## 2021-05-19 DIAGNOSIS — Z79899 Other long term (current) drug therapy: Secondary | ICD-10-CM | POA: Insufficient documentation

## 2021-05-19 DIAGNOSIS — G629 Polyneuropathy, unspecified: Secondary | ICD-10-CM | POA: Insufficient documentation

## 2021-05-19 DIAGNOSIS — R21 Rash and other nonspecific skin eruption: Secondary | ICD-10-CM | POA: Insufficient documentation

## 2021-05-19 DIAGNOSIS — E119 Type 2 diabetes mellitus without complications: Secondary | ICD-10-CM | POA: Insufficient documentation

## 2021-05-19 DIAGNOSIS — M797 Fibromyalgia: Secondary | ICD-10-CM | POA: Insufficient documentation

## 2021-05-19 LAB — CBC WITH DIFFERENTIAL (CANCER CENTER ONLY)
Abs Immature Granulocytes: 0.02 10*3/uL (ref 0.00–0.07)
Basophils Absolute: 0 10*3/uL (ref 0.0–0.1)
Basophils Relative: 0 %
Eosinophils Absolute: 0.1 10*3/uL (ref 0.0–0.5)
Eosinophils Relative: 1 %
HCT: 44.4 % (ref 36.0–46.0)
Hemoglobin: 14.8 g/dL (ref 12.0–15.0)
Immature Granulocytes: 0 %
Lymphocytes Relative: 37 %
Lymphs Abs: 3.2 10*3/uL (ref 0.7–4.0)
MCH: 29 pg (ref 26.0–34.0)
MCHC: 33.3 g/dL (ref 30.0–36.0)
MCV: 87.1 fL (ref 80.0–100.0)
Monocytes Absolute: 0.6 10*3/uL (ref 0.1–1.0)
Monocytes Relative: 7 %
Neutro Abs: 4.7 10*3/uL (ref 1.7–7.7)
Neutrophils Relative %: 55 %
Platelet Count: 273 10*3/uL (ref 150–400)
RBC: 5.1 MIL/uL (ref 3.87–5.11)
RDW: 12.5 % (ref 11.5–15.5)
WBC Count: 8.7 10*3/uL (ref 4.0–10.5)
nRBC: 0 % (ref 0.0–0.2)

## 2021-05-19 LAB — LACTATE DEHYDROGENASE: LDH: 153 U/L (ref 98–192)

## 2021-05-19 LAB — CMP (CANCER CENTER ONLY)
ALT: 27 U/L (ref 0–44)
AST: 21 U/L (ref 15–41)
Albumin: 4.2 g/dL (ref 3.5–5.0)
Alkaline Phosphatase: 72 U/L (ref 38–126)
Anion gap: 8 (ref 5–15)
BUN: 12 mg/dL (ref 6–20)
CO2: 25 mmol/L (ref 22–32)
Calcium: 9.7 mg/dL (ref 8.9–10.3)
Chloride: 101 mmol/L (ref 98–111)
Creatinine: 0.82 mg/dL (ref 0.44–1.00)
GFR, Estimated: >60 mL/min (ref >60)
Glucose, Bld: 167 mg/dL — ABNORMAL HIGH (ref 70–99)
Potassium: 4.3 mmol/L (ref 3.5–5.1)
Sodium: 134 mmol/L — ABNORMAL LOW (ref 135–145)
Total Bilirubin: 0.5 mg/dL (ref 0.3–1.2)
Total Protein: 7.3 g/dL (ref 6.5–8.1)

## 2021-05-19 LAB — SAVE SMEAR(SSMR), FOR PROVIDER SLIDE REVIEW

## 2021-05-19 NOTE — Progress Notes (Signed)
Hematology and Oncology Follow Up Visit  [name] [id number] [birth year] 50 y.o. 05/19/2021   Principle Diagnosis:  Iron deficiency anemia    Current Therapy: IV iron as indicated    Interim History:  [name] is here today for follow-up.   We last saw her back in December.  Since then, she been doing pretty well.  She recently had a mammogram done.    She and her family were up on [place] back in late June and early July.  That a wonderful time up there.  She is originally from [place].  When we last saw her, her iron studies showed a ferritin of 116 with an iron saturation of 38%.  There is been no change with her rash.  She has this unusual rash.  It was diagnosed as a small fiber neuropathy rash.  It seems to be flaring up a little bit, mostly on the legs.  She has had no change in bowel or bladder habits.  She has had no fever.  There is been no issues with COVID.  She has had no cough.  There is no headache.  Overall, her performance status is ECOG 0.    Medications:  Allergies as of 05/19/2021       Reactions   Penicillins Hives   Sulfa Antibiotics Hives   Sulfonamide Derivatives Hives        Medication List        Accurate as of May 19, 2021  3:14 PM. If you have any questions, ask your nurse or doctor.          STOP taking these medications    linaclotide 290 MCG Caps capsule Commonly known as: Market researcher Stopped by: Volanda Napoleon, MD   lisinopril 5 MG tablet Commonly known as: ZESTRIL Stopped by: Volanda Napoleon, MD   naproxen 500 MG tablet Commonly known as: NAPROSYN Stopped by: Volanda Napoleon, MD   sitaGLIPtin 50 MG tablet Commonly known as: JANUVIA Stopped by: Volanda Napoleon, MD       TAKE these medications    atorvastatin 40 MG tablet Commonly known as: LIPITOR Take 40 mg by mouth daily. What changed: Another medication with the same name was removed. Continue taking this medication, and follow the directions you see  here. Changed by: Volanda Napoleon, MD   buPROPion 300 MG 24 hr tablet Commonly known as: WELLBUTRIN XL Take 300 mg by mouth daily. What changed: Another medication with the same name was removed. Continue taking this medication, and follow the directions you see here. Changed by: Volanda Napoleon, MD   clobetasol cream 0.05 % Commonly known as: TEMOVATE Use topically twice daily to thickest lesions only & never to face   clonazePAM 1 MG tablet Commonly known as: KLONOPIN Take 1 mg by mouth 4 (four) times daily.   colchicine 0.6 MG tablet Take by mouth.   desonide 0.05 % ointment Commonly known as: DESOWEN Apply topically.   DULoxetine 60 MG capsule Commonly known as: CYMBALTA Take 60 mg daily by mouth.   Errin 0.35 MG tablet Generic drug: norethindrone Take 1 tablet by mouth daily.   glucose blood test strip Contour Next Test Strips  USE 1 STRIP TO CHECK GLUCOSE ONCE DAILY   hydroxychloroquine 200 MG tablet Commonly known as: PLAQUENIL Take 400 mg by mouth daily.   lamoTRIgine 200 MG tablet Commonly known as: LAMICTAL 1 tablet   Latuda 60 MG Tabs Generic drug: Lurasidone HCl Take by  mouth.   lidocaine 5 % ointment Commonly known as: XYLOCAINE Apply topically.   lisdexamfetamine 70 MG capsule Commonly known as: VYVANSE Take 70 mg by mouth 1 day or 1 dose.   losartan 100 MG tablet Commonly known as: COZAAR Take by mouth.   Microlet Lancets Misc Microlet Lancet  USE 1 TO CHECK GLUCOSE ONCE DAILY   omeprazole 40 MG capsule Commonly known as: PRILOSEC Take 1 capsule (40 mg total) by mouth daily.   ondansetron 8 MG disintegrating tablet Commonly known as: ZOFRAN-ODT Take 1 tablet (8 mg total) by mouth every 6 (six) hours as needed for nausea or vomiting.   polyethylene glycol 17 g packet Commonly known as: MIRALAX / GLYCOLAX Take 17 g by mouth daily.   promethazine 25 MG tablet Commonly known as: PHENERGAN TAKE 1 TABLET(25 MG) BY MOUTH EVERY 8  HOURS AS NEEDED FOR NAUSEA OR VOMITING   silver sulfADIAZINE 1 % cream Commonly known as: SILVADENE Apply topically.   Trulicity 2.40 XB/3.5HG Sopn Generic drug: Dulaglutide Inject into the skin.        Allergies:  Allergies  Allergen Reactions   Penicillins Hives   Sulfa Antibiotics Hives   Sulfonamide Derivatives Hives    Past Medical History, Surgical history, Social history, and Family History were reviewed and updated.  Review of Systems: Review of Systems  Constitutional:  Positive for malaise/fatigue.  HENT: Negative.    Eyes: Negative.   Respiratory: Negative.    Cardiovascular: Negative.   Gastrointestinal: Negative.   Genitourinary: Negative.   Musculoskeletal: Negative.   Skin:  Positive for rash.  Neurological: Negative.   Endo/Heme/Allergies: Negative.   Psychiatric/Behavioral: Negative.       weight is 259 lb 1.9 oz (117.5 kg). Her oral temperature is 99.1 F (37.3 C). Her blood pressure is 148/76 (abnormal) and her pulse is 81. Her respiration is 20 and oxygen saturation is 100%.   Wt Readings from Last 3 Encounters:  05/19/21 259 lb 1.9 oz (117.5 kg)  10/29/20 268 lb 4 oz (121.7 kg)  04/30/20 271 lb (122.9 kg)    Physical Exam Vitals reviewed.  HENT:     Head: Normocephalic and atraumatic.  Eyes:     Pupils: Pupils are equal, round, and reactive to light.  Cardiovascular:     Rate and Rhythm: Normal rate and regular rhythm.     Heart sounds: Normal heart sounds.  Pulmonary:     Effort: Pulmonary effort is normal.     Breath sounds: Normal breath sounds.  Abdominal:     General: Bowel sounds are normal.     Palpations: Abdomen is soft.  Musculoskeletal:        General: No tenderness or deformity. Normal range of motion.     Cervical back: Normal range of motion.  Lymphadenopathy:     Cervical: No cervical adenopathy.  Skin:    General: Skin is warm and dry.     Findings: No erythema or rash.     Comments: On her skin she does have  this faint macular rash.  There is no excoriations.  It is not blanch.  Neurological:     Mental Status: She is alert and oriented to person, place, and time.  Psychiatric:        Behavior: Behavior normal.        Thought Content: Thought content normal.        Judgment: Judgment normal.     Lab Results  Component Value Date   WBC 8.7  05/19/2021   HGB 14.8 05/19/2021   HCT 44.4 05/19/2021   MCV 87.1 05/19/2021   PLT 273 05/19/2021   Lab Results  Component Value Date   FERRITIN 116 10/29/2020   IRON 124 10/29/2020   TIBC 321 10/29/2020   UIBC 198 10/29/2020   IRONPCTSAT 38 10/29/2020   Lab Results  Component Value Date   RETICCTPCT 1.8 10/29/2020   RBC 5.10 05/19/2021   No results found for: KPAFRELGTCHN, LAMBDASER, KAPLAMBRATIO No results found for: IGGSERUM, IGA, IGMSERUM No results found for: Ronnald Ramp, A1GS, A2GS, Violet Baldy, MSPIKE, SPEI   Chemistry      Component Value Date/Time   NA 134 (L) 05/19/2021 1432   K 4.3 05/19/2021 1432   CL 101 05/19/2021 1432   CO2 25 05/19/2021 1432   BUN 12 05/19/2021 1432   CREATININE 0.82 05/19/2021 1432      Component Value Date/Time   CALCIUM 9.7 05/19/2021 1432   ALKPHOS 72 05/19/2021 1432   AST 21 05/19/2021 1432   ALT 27 05/19/2021 1432   BILITOT 0.5 05/19/2021 1432       Impression and Plan: Ms. Mcree is a very pleasant 50 yo caucasian female with history iron deficiency anemia as well as several autoimmune issues including Sjogren's, fibromyalgia and type II diabetes.   I would be surprised if her iron studies are on the low side.  Her MCV continues to trend upward.  We will have to see what her iron studies actually are.  I will get these back later on today.  I will have to find out if she is still getting ketamine infusions.  We will plan to get her back in another 6 months.     Volanda Napoleon, MD 7/18/20223:14 PM

## 2021-05-20 LAB — IRON AND TIBC
Iron: 80 ug/dL (ref 41–142)
Saturation Ratios: 26 % (ref 21–57)
TIBC: 310 ug/dL (ref 236–444)
UIBC: 230 ug/dL (ref 120–384)

## 2021-05-20 LAB — FERRITIN: Ferritin: 68 ng/mL (ref 11–307)

## 2021-05-20 LAB — IGG, IGA, IGM
IgA: 154 mg/dL (ref 87–352)
IgG (Immunoglobin G), Serum: 1134 mg/dL (ref 586–1602)
IgM (Immunoglobulin M), Srm: 124 mg/dL (ref 26–217)

## 2021-11-12 ENCOUNTER — Encounter: Payer: Self-pay | Admitting: Family

## 2021-11-19 ENCOUNTER — Ambulatory Visit: Payer: Medicare (Managed Care) | Admitting: Family

## 2021-11-19 ENCOUNTER — Other Ambulatory Visit: Payer: Medicare (Managed Care)

## 2021-11-24 ENCOUNTER — Inpatient Hospital Stay: Payer: Medicare (Managed Care) | Attending: Hematology & Oncology

## 2021-11-24 ENCOUNTER — Other Ambulatory Visit: Payer: Self-pay

## 2021-11-24 ENCOUNTER — Inpatient Hospital Stay (HOSPITAL_BASED_OUTPATIENT_CLINIC_OR_DEPARTMENT_OTHER): Payer: Medicare (Managed Care) | Admitting: Family

## 2021-11-24 ENCOUNTER — Encounter: Payer: Self-pay | Admitting: Family

## 2021-11-24 VITALS — BP 138/66 | HR 78 | Temp 98.0°F | Resp 17 | Wt 259.1 lb

## 2021-11-24 DIAGNOSIS — M797 Fibromyalgia: Secondary | ICD-10-CM | POA: Diagnosis not present

## 2021-11-24 DIAGNOSIS — D509 Iron deficiency anemia, unspecified: Secondary | ICD-10-CM

## 2021-11-24 DIAGNOSIS — D500 Iron deficiency anemia secondary to blood loss (chronic): Secondary | ICD-10-CM

## 2021-11-24 DIAGNOSIS — Z79899 Other long term (current) drug therapy: Secondary | ICD-10-CM | POA: Insufficient documentation

## 2021-11-24 DIAGNOSIS — E118 Type 2 diabetes mellitus with unspecified complications: Secondary | ICD-10-CM | POA: Diagnosis not present

## 2021-11-24 DIAGNOSIS — M3500 Sicca syndrome, unspecified: Secondary | ICD-10-CM | POA: Insufficient documentation

## 2021-11-24 LAB — CBC WITH DIFFERENTIAL (CANCER CENTER ONLY)
Abs Immature Granulocytes: 0.07 10*3/uL (ref 0.00–0.07)
Basophils Absolute: 0 10*3/uL (ref 0.0–0.1)
Basophils Relative: 0 %
Eosinophils Absolute: 0.1 10*3/uL (ref 0.0–0.5)
Eosinophils Relative: 2 %
HCT: 39.5 % (ref 36.0–46.0)
Hemoglobin: 13.4 g/dL (ref 12.0–15.0)
Immature Granulocytes: 1 %
Lymphocytes Relative: 34 %
Lymphs Abs: 2.7 10*3/uL (ref 0.7–4.0)
MCH: 28.9 pg (ref 26.0–34.0)
MCHC: 33.9 g/dL (ref 30.0–36.0)
MCV: 85.1 fL (ref 80.0–100.0)
Monocytes Absolute: 0.6 10*3/uL (ref 0.1–1.0)
Monocytes Relative: 8 %
Neutro Abs: 4.4 10*3/uL (ref 1.7–7.7)
Neutrophils Relative %: 55 %
Platelet Count: 265 10*3/uL (ref 150–400)
RBC: 4.64 MIL/uL (ref 3.87–5.11)
RDW: 13.1 % (ref 11.5–15.5)
WBC Count: 7.9 10*3/uL (ref 4.0–10.5)
nRBC: 0 % (ref 0.0–0.2)

## 2021-11-24 LAB — CMP (CANCER CENTER ONLY)
ALT: 29 U/L (ref 0–44)
AST: 24 U/L (ref 15–41)
Albumin: 4.1 g/dL (ref 3.5–5.0)
Alkaline Phosphatase: 80 U/L (ref 38–126)
Anion gap: 7 (ref 5–15)
BUN: 15 mg/dL (ref 6–20)
CO2: 26 mmol/L (ref 22–32)
Calcium: 9.5 mg/dL (ref 8.9–10.3)
Chloride: 103 mmol/L (ref 98–111)
Creatinine: 0.73 mg/dL (ref 0.44–1.00)
GFR, Estimated: >60 mL/min (ref >60)
Glucose, Bld: 129 mg/dL — ABNORMAL HIGH (ref 70–99)
Potassium: 3.6 mmol/L (ref 3.5–5.1)
Sodium: 136 mmol/L (ref 135–145)
Total Bilirubin: 0.4 mg/dL (ref 0.3–1.2)
Total Protein: 6.9 g/dL (ref 6.5–8.1)

## 2021-11-24 LAB — RETICULOCYTES
Immature Retic Fract: 5 % (ref 2.3–15.9)
RBC.: 4.66 MIL/uL (ref 3.87–5.11)
Retic Count, Absolute: 75.5 10*3/uL (ref 19.0–186.0)
Retic Ct Pct: 1.6 % (ref 0.4–3.1)

## 2021-11-24 LAB — SAVE SMEAR(SSMR), FOR PROVIDER SLIDE REVIEW

## 2021-11-24 NOTE — Progress Notes (Signed)
Hematology and Oncology Follow Up Visit  [name] [id number] April 11, 1971 51 y.o. 11/24/2021   Principle Diagnosis:  Iron deficiency anemia    Current Therapy: IV iron as indicated    Interim History:  [name] is here today for follow-up. She is doing well and has no complaints at this time.  Her cycles has been irregular and flow light. No other blood loss noted.  No abnormal bruising, no petechiae.  Fever, chills, n/v, cough, rash, dizziness, SOB, chest pain, palpitations, abdominal pain or changes in bowel or bladder habits.  No swelling, tenderness, numbness or tingling in her extremities.  No falls or syncope.  She has maintained a good appetite and is staying well hydrated. Her weight is stable at 259 lbs.   ECOG Performance Status: 1 - Symptomatic but completely ambulatory  Medications:  Allergies as of 11/24/2021       Reactions   Penicillins Hives   Sulfa Antibiotics Hives   Sulfonamide Derivatives Hives        Medication List        Accurate as of November 24, 2021  2:25 PM. If you have any questions, ask your nurse or doctor.          atorvastatin 40 MG tablet Commonly known as: LIPITOR Take 40 mg by mouth daily.   buPROPion 300 MG 24 hr tablet Commonly known as: WELLBUTRIN XL Take 300 mg by mouth daily.   clobetasol cream 0.05 % Commonly known as: TEMOVATE Use topically twice daily to thickest lesions only & never to face   clonazePAM 1 MG tablet Commonly known as: KLONOPIN Take 1 mg by mouth 4 (four) times daily.   colchicine 0.6 MG tablet Take by mouth.   desonide 0.05 % ointment Commonly known as: DESOWEN Apply topically.   DULoxetine 60 MG capsule Commonly known as: CYMBALTA Take 60 mg daily by mouth.   Errin 0.35 MG tablet Generic drug: norethindrone Take 1 tablet by mouth daily.   glucose blood test strip Contour Next Test Strips  USE 1 STRIP TO CHECK GLUCOSE ONCE DAILY   hydroxychloroquine 200 MG tablet Commonly  known as: PLAQUENIL Take 400 mg by mouth daily.   lamoTRIgine 200 MG tablet Commonly known as: LAMICTAL 1 tablet   Latuda 60 MG Tabs Generic drug: Lurasidone HCl Take by mouth.   lidocaine 5 % ointment Commonly known as: XYLOCAINE Apply topically.   lisdexamfetamine 70 MG capsule Commonly known as: VYVANSE Take 70 mg by mouth 1 day or 1 dose.   losartan 100 MG tablet Commonly known as: COZAAR Take by mouth.   Microlet Lancets Misc Microlet Lancet  USE 1 TO CHECK GLUCOSE ONCE DAILY   omeprazole 40 MG capsule Commonly known as: PRILOSEC Take 1 capsule (40 mg total) by mouth daily.   ondansetron 8 MG disintegrating tablet Commonly known as: ZOFRAN-ODT Take 1 tablet (8 mg total) by mouth every 6 (six) hours as needed for nausea or vomiting.   polyethylene glycol 17 g packet Commonly known as: MIRALAX / GLYCOLAX Take 17 g by mouth daily.   promethazine 25 MG tablet Commonly known as: PHENERGAN TAKE 1 TABLET(25 MG) BY MOUTH EVERY 8 HOURS AS NEEDED FOR NAUSEA OR VOMITING   silver sulfADIAZINE 1 % cream Commonly known as: SILVADENE Apply topically.   Trulicity 6.94 WN/4.6EV Sopn Generic drug: Dulaglutide Inject into the skin.        Allergies:  Allergies  Allergen Reactions   Penicillins Hives   Sulfa Antibiotics Hives  Sulfonamide Derivatives Hives    Past Medical History, Surgical history, Social history, and Family History were reviewed and updated.  Review of Systems: All other 10 point review of systems is negative.   Physical Exam:  weight is 259 lb 1.9 oz (117.5 kg). Her oral temperature is 98 F (36.7 C). Her blood pressure is 138/66 and her pulse is 78. Her respiration is 17 and oxygen saturation is 98%.   Wt Readings from Last 3 Encounters:  11/24/21 259 lb 1.9 oz (117.5 kg)  05/19/21 259 lb 1.9 oz (117.5 kg)  10/29/20 268 lb 4 oz (121.7 kg)    Ocular: Sclerae unicteric, pupils equal, round and reactive to light Ear-nose-throat:  Oropharynx clear, dentition fair Lymphatic: No cervical or supraclavicular adenopathy Lungs no rales or rhonchi, good excursion bilaterally Heart regular rate and rhythm, no murmur appreciated Abd soft, nontender, positive bowel sounds MSK no focal spinal tenderness, no joint edema Neuro: non-focal, well-oriented, appropriate affect Breasts: Deferred   Lab Results  Component Value Date   WBC 7.9 11/24/2021   HGB 13.4 11/24/2021   HCT 39.5 11/24/2021   MCV 85.1 11/24/2021   PLT 265 11/24/2021   Lab Results  Component Value Date   FERRITIN 68 05/19/2021   IRON 80 05/19/2021   TIBC 310 05/19/2021   UIBC 230 05/19/2021   IRONPCTSAT 26 05/19/2021   Lab Results  Component Value Date   RETICCTPCT 1.6 11/24/2021   RBC 4.66 11/24/2021   No results found for: Nils Pyle [facility] Lab Results  Component Value Date   IGGSERUM 1,134 05/19/2021   IGA 154 05/19/2021   IGMSERUM 124 05/19/2021   No results found for: Ronnald Ramp, A1GS, A2GS, Violet Baldy, MSPIKE, SPEI   Chemistry      Component Value Date/Time   NA 134 (L) 05/19/2021 1432   K 4.3 05/19/2021 1432   CL 101 05/19/2021 1432   CO2 25 05/19/2021 1432   BUN 12 05/19/2021 1432   CREATININE 0.82 05/19/2021 1432      Component Value Date/Time   CALCIUM 9.7 05/19/2021 1432   ALKPHOS 72 05/19/2021 1432   AST 21 05/19/2021 1432   ALT 27 05/19/2021 1432   BILITOT 0.5 05/19/2021 1432       Impression and Plan: [name] is a very pleasant 51 yo caucasian female with history iron deficiency anemia as well as several autoimmune issues including Sjogren's, fibromyalgia and type II diabetes.  Iron studies are pending. We will replace if needed.  Follow-up in 6 months.   Laura Dawson, NP 1/23/20232:25 PM

## 2021-11-25 ENCOUNTER — Telehealth: Payer: Self-pay | Admitting: *Deleted

## 2021-11-25 LAB — IRON AND IRON BINDING CAPACITY (CC-WL,HP ONLY)
Iron: 73 ug/dL (ref 28–170)
Saturation Ratios: 22 % (ref 10.4–31.8)
TIBC: 335 ug/dL (ref 250–450)
UIBC: 262 ug/dL (ref 148–442)

## 2021-11-25 LAB — FERRITIN: Ferritin: 31 ng/mL (ref 11–307)

## 2021-11-25 NOTE — Telephone Encounter (Signed)
Per 11/25/21 los - called and gave upcoming appointments - confirmed

## 2021-12-11 ENCOUNTER — Ambulatory Visit: Payer: Medicare (Managed Care) | Admitting: Cardiovascular Disease

## 2022-01-09 ENCOUNTER — Other Ambulatory Visit: Payer: Self-pay

## 2022-01-09 ENCOUNTER — Ambulatory Visit (INDEPENDENT_AMBULATORY_CARE_PROVIDER_SITE_OTHER): Payer: Medicare (Managed Care) | Admitting: Cardiovascular Disease

## 2022-01-09 VITALS — BP 142/90 | HR 87 | Ht 67.0 in | Wt 246.6 lb

## 2022-01-09 DIAGNOSIS — R9431 Abnormal electrocardiogram [ECG] [EKG]: Secondary | ICD-10-CM

## 2022-01-09 DIAGNOSIS — E7800 Pure hypercholesterolemia, unspecified: Secondary | ICD-10-CM

## 2022-01-09 DIAGNOSIS — R072 Precordial pain: Secondary | ICD-10-CM

## 2022-01-09 DIAGNOSIS — I7300 Raynaud's syndrome without gangrene: Secondary | ICD-10-CM

## 2022-01-09 DIAGNOSIS — E1129 Type 2 diabetes mellitus with other diabetic kidney complication: Secondary | ICD-10-CM

## 2022-01-09 DIAGNOSIS — R809 Proteinuria, unspecified: Secondary | ICD-10-CM

## 2022-01-09 DIAGNOSIS — I10 Essential (primary) hypertension: Secondary | ICD-10-CM | POA: Diagnosis not present

## 2022-01-09 MED ORDER — AMLODIPINE BESYLATE 2.5 MG PO TABS: 2.5000 mg | ORAL_TABLET | Freq: Every day | ORAL | 3 refills | Status: DC

## 2022-01-09 MED ORDER — METOPROLOL TARTRATE 100 MG PO TABS: ORAL_TABLET | ORAL | 0 refills | Status: DC

## 2022-01-09 NOTE — Patient Instructions (Signed)
Medication Instructions:  ?START Amlodipine 2.5 mg once daily ? ?*If you need a refill on your cardiac medications before your next appointment, please call your pharmacy* ? ? ?Lab Work: ?Your provider would like for you to have the following labs today: BMET ? ?If you have labs (blood work) drawn today and your tests are completely normal, you will receive your results only by: ?MyChart Message (if you have MyChart) OR ?A paper copy in the mail ?If you have any lab test that is abnormal or we need to change your treatment, we will call you to review the results. ? ? ?Testing/Procedures: ?Your physician has requested that you have an echocardiogram. Echocardiography is a painless test that uses sound waves to create images of your heart. It provides your doctor with information about the size and shape of your heart and how well your heart?s chambers and valves are working. You may receive an ultrasound enhancing agent through an IV if needed to better visualize your heart during the echo.This procedure takes approximately one hour. There are no restrictions for this procedure. This will take place at the [address].  ? ? ? ?Follow-Up: ?At [facility], you and your health needs are our priority.  As part of our continuing mission to provide you with exceptional heart care, we have created designated Provider Care Teams.  These Care Teams include your primary Cardiologist (physician) and Advanced Practice Providers (APPs -  Physician Assistants and Nurse Practitioners) who all work together to provide you with the care you need, when you need it. ? ?We recommend signing up for the patient portal called "MyChart".  Sign up information is provided on this After Visit Summary.  MyChart is used to connect with patients for Virtual Visits (Telemedicine).  Patients are able to view lab/test results, encounter notes, upcoming appointments, etc.  Non-urgent messages can be sent to your provider as well.    ?To learn more about what you can do with MyChart, go to [email or web address].   ? ?Your next appointment:   ?Follow up after the testing with Dr. Sallyanne Kuster ? ?Other Instructions ? ? ?Your cardiac CT will be scheduled at one of the below locations:  ? ?[facility] ?[address] ?[place], [place] [address] ?[phone] ? ?OR ? ?[place] ?[address] ?[address] ?[place], [place] [address] ?[phone] ? ?If scheduled at [facility], please arrive at the [facility] and Children's Entrance (Entrance C2) of [facility] 30 minutes prior to test start time. ?You can use the FREE valet parking offered at entrance C (encouraged to control the heart rate for the test)  ?Proceed to the [facility] Radiology Department (first floor) to check-in and test prep. ? ?All radiology patients and guests should use entrance C2 at [facility], accessed from [facility], even though the hospital's physical address listed is [address] ? ? ? ?If scheduled at [facility], please arrive 15 mins early for check-in and test prep. ? ?Please follow these instructions carefully (unless otherwise directed): ? ? ?On the Night Before the Test: ?Be sure to Drink plenty of water. ?Do not consume any caffeinated/decaffeinated beverages or chocolate 12 hours prior to your test. ?Do not take any antihistamines 12 hours prior to your test. ? ?On the Day of the Test: ?Drink plenty of water until 1 hour prior to the test. ?Do not eat any food 4 hours prior  to the test. ?You may take your regular medications prior to the test.  ?Take metoprolol (Lopressor) two hours prior to test. ?FEMALES- please wear underwire-free bra if available, avoid dresses & tight clothing ? ?     ?After the Test: ?Drink plenty of water. ?After receiving IV contrast, you may experience a mild flushed feeling. This is normal. ?On occasion, you may  experience a mild rash up to 24 hours after the test. This is not dangerous. If this occurs, you can take Benadryl 25 mg and increase your fluid intake. ?If you experience trouble breathing, this can be serious. If it is severe call 911 IMMEDIATELY. If it is mild, please call our office. ?If you take any of these medications: Glipizide/Metformin, Avandament, Glucavance, please do not take 48 hours after completing test unless otherwise instructed. ? ?We will call to schedule your test 2-4 weeks out understanding that some insurance companies will need an authorization prior to the service being performed.  ? ?For non-scheduling related questions, please contact the cardiac imaging nurse navigator should you have any questions/concerns: ?Marchia Bond, Cardiac Imaging Nurse Navigator ?Gordy Clement, Cardiac Imaging Nurse Navigator ?[facility] ?Direct Office Dial: [phone]  ? ?For scheduling needs, including cancellations and rescheduling, please call [place], [phone]. ? ? ?

## 2022-01-09 NOTE — Progress Notes (Signed)
Cardiology Office Note:    Date:  01/10/2022   ID:  [name], DOB April 23, 1971, MRN [id number]  PCP:  Aura Dials, MD   [facility] Providers Cardiologist:  Sanda Klein, MD     Referring MD: Aleen Campi, NP   Chief Complaint  Patient presents with   Abnormal ECG  [name] is a 51 y.o. female who is being seen today for the evaluation of chest pain and abnormal ECG at the request of Aleen Campi, NP.   History of Present Illness:    [name] is a 51 y.o. female with a hx of hypertension, type 2 diabetes mellitus, hypercholesterolemia, PCOS, severe obesity, Sjogren's syndrome and anemia referred after she was found to have an abnormal ECG during evaluation for chest pain.  She felt like she had "the flu" was clammy and sweaty and went to be evaluated.  Her ECG was interpreted as showing an anterior MI.  She describes pain on the left side of her chest with corresponding tenderness to palpation and pain in her left shoulder that sounds positional and therefore musculoskeletal.  She does not have exertional chest discomfort.  She is very sedentary, primarily due to problems with pain in her feet where she has severe neuropathy.  She is extremely anxious.  She denies lower extremity edema, orthopnea or PND.  She does not have palpitations dizziness or syncope.  She develops typical purple and white discoloration of her fingertips when taking a cold drink out of the refrigerator and has chronic cyanosis of her feet due to Raynaud's syndrome.  She is not receiving a calcium channel blocker at this time.  Repeat electrocardiogram today shows normal sinus rhythm with diffuse nonspecific T wave changes.  The QS pattern in the anteroseptal leads is no longer present, suggesting that on the previous ECG the ECG leads may have been placed high.  Questionable Q waves are seen in the inferior leads on the current tracing.  Due to the diffusely flattened T waves and some baseline  tremor artifact it is very hard to measure her QT interval accurately.  On statin therapy, her most recent lipid profile shows improvement from 2021.  Her total cholesterol 181, LDL 107, HDL 51, triglycerides 144.  Hemoglobin A1c was 6.9% in 2021 and office notes report a more recent value of 6.1%.  Her only current medication for diabetes mellitus is Trulicity.  She has been slowly losing weight since taking this medication.  She has normal renal function but has persistent proteinuria and is under the care of a nephrologist.  Her blood pressure is mildly elevated today at 142/90 and has been persistently in roughly the same range.  She is taking a maximum dose of losartan 100 mg daily.  Past Medical History:  Diagnosis Date   Allergy    Anemia    Anxiety    Depression    Diabetes (HCC)    Elevated LFTs    Fibromyalgia    Hypercholesteremia    Hypertension    Hypomagnesemia    Morbid obesity ([place])    Sjoegren syndrome     Past Surgical History:  Procedure Laterality Date   COLON SURGERY     ECT TREATMENT     ESSURE TUBAL LIGATION     TONSILLECTOMY     UPPER GASTROINTESTINAL ENDOSCOPY     WISDOM TOOTH EXTRACTION      Current Medications: Current Meds  Medication Sig   amLODipine (NORVASC) 2.5 MG tablet Take 1 tablet (  2.5 mg total) by mouth daily.   atorvastatin (LIPITOR) 40 MG tablet Take 40 mg by mouth daily.   buPROPion (WELLBUTRIN XL) 300 MG 24 hr tablet Take 300 mg by mouth daily.   clonazePAM (KLONOPIN) 1 MG tablet Take 1 mg by mouth 4 (four) times daily.   colchicine 0.6 MG tablet Take by mouth.   DULoxetine (CYMBALTA) 60 MG capsule Take 60 mg daily by mouth.   glucose blood test strip Contour Next Test Strips  USE 1 STRIP TO CHECK GLUCOSE ONCE DAILY   hydroxychloroquine (PLAQUENIL) 200 MG tablet Take 400 mg by mouth daily.   lamoTRIgine (LAMICTAL) 200 MG tablet 1 tablet   lisdexamfetamine (VYVANSE) 70 MG capsule Take 70 mg by mouth 1 day or 1 dose.   losartan  (COZAAR) 100 MG tablet Take by mouth.   Lurasidone HCl (LATUDA) 60 MG TABS Take by mouth.   metoprolol tartrate (LOPRESSOR) 100 MG tablet Take one tablet two hours before the test   Microlet Lancets MISC Microlet Lancet  USE 1 TO CHECK GLUCOSE ONCE DAILY   omeprazole (PRILOSEC) 40 MG capsule Take 1 capsule (40 mg total) by mouth daily.     Allergies:   Penicillins, Sulfa antibiotics, and Sulfonamide derivatives   Social History   Socioeconomic History   Marital status: Married    Spouse name: Not on file   Number of children: 2   Years of education: Not on file   Highest education level: Not on file  Occupational History   Occupation: disabled  Tobacco Use   Smoking status: Former    Types: Cigarettes    Quit date: 1994    Years since quitting: 29.2   Smokeless tobacco: Never  Vaping Use   Vaping Use: Never used  Substance and Sexual Activity   Alcohol use: No    Alcohol/week: 0.0 standard drinks   Drug use: No   Sexual activity: Not on file  Other Topics Concern   Not on file  Social History Narrative   Not on file   Social Determinants of Health   Financial Resource Strain: Not on file  Food Insecurity: Not on file  Transportation Needs: Not on file  Physical Activity: Not on file  Stress: Not on file  Social Connections: Not on file     Family History: The patient's family history includes Breast cancer in her paternal aunt and paternal aunt; Diabetes in her father and mother; Lupus in her paternal aunt; Multiple sclerosis in her mother; Prostate cancer in her father; Psoriasis in her father; Rheum arthritis in her paternal aunt. There is no history of Colon cancer, Esophageal cancer, Rectal cancer, or Stomach cancer.  ROS:   Please see the history of present illness.     All other systems reviewed and are negative.  EKGs/Labs/Other Studies Reviewed:    The following studies were reviewed today: Notes, ECG tracing from primary care provider  EKG:  EKG is  ordered today.  The ekg ordered today demonstrates normal sinus rhythm, questionable Q waves in leads III and aVF, but not in lead II, diffuse nonspecific T wave flattening, hard to measure the QT accurately.  Recent Labs: 11/24/2021: ALT 29; Hemoglobin 13.4; Platelet Count 265 01/09/2022: BUN 9; Creatinine, Ser 0.78; Potassium 4.4; Sodium 138  Recent Lipid Panel No results found for: CHOL, TRIG, HDL, CHOLHDL, VLDL, LDLCALC, LDLDIRECT 09/18/2021 Cholesterol 181, HDL 51, LDL 107, triglycerides 144  Risk Assessment/Calculations:           Physical  Exam:    VS:  BP (!) 142/90    Pulse 87    Ht '5\' 7"'$  (4.854 m)    Wt 246 lb 9.6 oz (111.9 kg)    SpO2 97%    BMI 38.62 kg/m     Wt Readings from Last 3 Encounters:  01/09/22 246 lb 9.6 oz (111.9 kg)  11/24/21 259 lb 1.9 oz (117.5 kg)  05/19/21 259 lb 1.9 oz (117.5 kg)     GEN: Severely obese, well nourished, well developed in no acute distress HEENT: Normal NECK: No JVD; No carotid bruits LYMPHATICS: No lymphadenopathy CARDIAC: RRR, no murmurs, rubs, gallops.  She has normal bilateral radial ulnar and pedal pulses but has significant cyanosis of the feet bilaterally (she is wearing flip-flops) chest pain: RESPIRATORY:  Clear to auscultation without rales, wheezing or rhonchi  ABDOMEN: Soft, non-tender, non-distended MUSCULOSKELETAL:  No edema; No deformity  SKIN: Warm and dry NEUROLOGIC:  Alert and oriented x 3 PSYCHIATRIC:  Normal affect   ASSESSMENT:    1. Precordial pain   2. Nonspecific abnormal electrocardiogram (ECG) (EKG)   3. Hypercholesterolemia   4. Essential hypertension   5. Raynaud's syndrome without gangrene   6. Type 2 diabetes mellitus with proteinuria (HCC)   7. Severe obesity (BMI 35.0-39.9) with comorbidity ([place])    PLAN:    In order of problems listed above:  Chest pain: Symptoms are atypical, but she has had some ECG changes and has numerous coronary risk factors including hypertension,  hypercholesterolemia, type 2 diabetes mellitus, severe obesity and a history of smoking (although quit several years ago).  Since she has Raynaud's syndrome, possibility that she has coronary spasm, but her symptoms are atypical even for that.  Recommend coronary CT angiography which will also give [place] an estimation of plaque based on coronary calcium score and can help [place] fine-tune her lipid-lowering prescription.   Abnormal ECG: Mostly I think the changes were related to lead placement.  Will also recommend an echocardiogram to exclude wall motion abnormalities due to possible previous infarction. Hypercholesterolemia: Despite statin therapy she still has not reached target for her LDL cholesterol (definitely would want less than 100 due to history of diabetes mellitus).  If she has an elevated coronary calcium score, might even benefit from aggressive lipid reduction to LDL less than 70. HTN: At a recent primary care provider visit her blood pressure was better at 128/80, but for the most part her blood pressure has been in the 140s/80-90.  We will add amlodipine 2.5 mg once daily.  Continue losartan. Raynaud's syndrome: May be part of her autoimmune disorder/Sjogren's syndrome.  Amlodipine should be beneficial.  Treatment with ARB should help prevent amlodipine related lower extremity edema.  On hydroxychloroquine for her autoimmune disorder. DM: her BMI used to be greater than 40, so it appears that the Trulicity has truly helped her lose weight.  Another good choice would be an SGLT2 inhibitor, but her current hemoglobin A1c is well within target range.  She is frustrated by her inability to lose weight.           Medication Adjustments/Labs and Tests Ordered: Current medicines are reviewed at length with the patient today.  Concerns regarding medicines are outlined above.  Orders Placed This Encounter  Procedures   CT CORONARY MORPH W/CTA COR W/SCORE W/CA W/CM &/OR WO/CM   Basic metabolic  panel   ECHOCARDIOGRAM COMPLETE   Meds ordered this encounter  Medications   amLODipine (NORVASC) 2.5 MG tablet  Sig: Take 1 tablet (2.5 mg total) by mouth daily.    Dispense:  90 tablet    Refill:  3   metoprolol tartrate (LOPRESSOR) 100 MG tablet    Sig: Take one tablet two hours before the test    Dispense:  1 tablet    Refill:  0    Patient Instructions  Medication Instructions:  START Amlodipine 2.5 mg once daily  *If you need a refill on your cardiac medications before your next appointment, please call your pharmacy*   Lab Work: Your provider would like for you to have the following labs today: BMET  If you have labs (blood work) drawn today and your tests are completely normal, you will receive your results only by: MyChart Message (if you have MyChart) OR A paper copy in the mail If you have any lab test that is abnormal or we need to change your treatment, we will call you to review the results.   Testing/Procedures: Your physician has requested that you have an echocardiogram. Echocardiography is a painless test that uses sound waves to create images of your heart. It provides your doctor with information about the size and shape of your heart and how well your hearts chambers and valves are working. You may receive an ultrasound enhancing agent through an IV if needed to better visualize your heart during the echo.This procedure takes approximately one hour. There are no restrictions for this procedure. This will take place at the [address], [address].     Follow-Up: At [facility], you and your health needs are our priority.  As part of our continuing mission to provide you with exceptional heart care, we have created designated Provider Care Teams.  These Care Teams include your primary Cardiologist (physician) and Advanced Practice Providers (APPs -  Physician Assistants and Nurse Practitioners) who all work together to provide you with the care you  need, when you need it.  We recommend signing up for the patient portal called "MyChart".  Sign up information is provided on this After Visit Summary.  MyChart is used to connect with patients for Virtual Visits (Telemedicine).  Patients are able to view lab/test results, encounter notes, upcoming appointments, etc.  Non-urgent messages can be sent to your provider as well.   To learn more about what you can do with MyChart, go to [email or web address].    Your next appointment:   Follow up after the testing with Dr. Sallyanne Kuster  Other Instructions   Your cardiac CT will be scheduled at one of the below locations:   [facility] [address] [place], [place] [address] [phone]  [facility] [address] [place], [place] [address] [phone]  If scheduled at [facility], please arrive at the [facility] and Children's Entrance (Entrance C2) of [facility] 30 minutes prior to test start time. You can use the FREE valet parking offered at entrance C (encouraged to control the heart rate for the test)  Proceed to the [facility] Radiology Department (first floor) to check-in and test prep.  All radiology patients and guests should use entrance C2 at [facility], accessed from [facility], even though the hospital's physical address listed is [address]    If scheduled at [facility], please arrive 15 mins early for check-in and test prep.  Please follow these instructions carefully (unless otherwise directed):   On the  Night Before the Test: Be sure to Drink plenty of water. Do not consume any caffeinated/decaffeinated beverages or chocolate 12 hours prior to your test. Do not take any antihistamines 12 hours prior to your test.  On the Day of the Test: Drink plenty of water until 1 hour prior to the test. Do not eat any food 4 hours  prior to the test. You may take your regular medications prior to the test.  Take metoprolol (Lopressor) two hours prior to test. FEMALES- please wear underwire-free bra if available, avoid dresses & tight clothing       After the Test: Drink plenty of water. After receiving IV contrast, you may experience a mild flushed feeling. This is normal. On occasion, you may experience a mild rash up to 24 hours after the test. This is not dangerous. If this occurs, you can take Benadryl 25 mg and increase your fluid intake. If you experience trouble breathing, this can be serious. If it is severe call 911 IMMEDIATELY. If it is mild, please call our office. If you take any of these medications: Glipizide/Metformin, Avandament, Glucavance, please do not take 48 hours after completing test unless otherwise instructed.  We will call to schedule your test 2-4 weeks out understanding that some insurance companies will need an authorization prior to the service being performed.   For non-scheduling related questions, please contact the cardiac imaging nurse navigator should you have any questions/concerns: Marchia Bond, Cardiac Imaging Nurse Navigator Gordy Clement, Cardiac Imaging Nurse Navigator [facility] Direct Office Dial: [phone]   For scheduling needs, including cancellations and rescheduling, please call [place], [phone].     Signed, Sanda Klein, MD  01/10/2022 4:37 PM    [place]

## 2022-01-10 ENCOUNTER — Encounter: Payer: Self-pay | Admitting: Cardiovascular Disease

## 2022-01-10 DIAGNOSIS — I7300 Raynaud's syndrome without gangrene: Secondary | ICD-10-CM | POA: Insufficient documentation

## 2022-01-10 DIAGNOSIS — E1129 Type 2 diabetes mellitus with other diabetic kidney complication: Secondary | ICD-10-CM | POA: Insufficient documentation

## 2022-01-10 DIAGNOSIS — I10 Essential (primary) hypertension: Secondary | ICD-10-CM | POA: Insufficient documentation

## 2022-01-10 DIAGNOSIS — E7800 Pure hypercholesterolemia, unspecified: Secondary | ICD-10-CM | POA: Insufficient documentation

## 2022-01-10 DIAGNOSIS — R809 Proteinuria, unspecified: Secondary | ICD-10-CM | POA: Insufficient documentation

## 2022-01-10 LAB — BASIC METABOLIC PANEL
BUN/Creatinine Ratio: 12 (ref 9–23)
BUN: 9 mg/dL (ref 6–24)
CO2: 24 mmol/L (ref 20–29)
Calcium: 10 mg/dL (ref 8.7–10.2)
Chloride: 98 mmol/L (ref 96–106)
Creatinine, Ser: 0.78 mg/dL (ref 0.57–1.00)
Glucose: 99 mg/dL (ref 70–99)
Potassium: 4.4 mmol/L (ref 3.5–5.2)
Sodium: 138 mmol/L (ref 134–144)
eGFR: 93 mL/min/{1.73_m2} (ref >59)

## 2022-01-13 ENCOUNTER — Encounter: Payer: Self-pay | Admitting: Family

## 2022-01-13 NOTE — Addendum Note (Signed)
Addended by: Ricci Barker on: 01/13/2022 10:35 AM ? ? Modules accepted: Orders ? ?

## 2022-01-14 ENCOUNTER — Encounter: Payer: Self-pay | Admitting: Cardiovascular Disease

## 2022-01-16 ENCOUNTER — Other Ambulatory Visit: Payer: Self-pay

## 2022-01-16 ENCOUNTER — Ambulatory Visit (HOSPITAL_COMMUNITY): Payer: Medicare (Managed Care) | Attending: Cardiology

## 2022-01-16 DIAGNOSIS — R079 Chest pain, unspecified: Secondary | ICD-10-CM

## 2022-01-16 DIAGNOSIS — R072 Precordial pain: Secondary | ICD-10-CM | POA: Insufficient documentation

## 2022-01-16 LAB — ECHOCARDIOGRAM COMPLETE
Area-P 1/2: 4.18 cm2
S' Lateral: 2.8 cm

## 2022-01-28 ENCOUNTER — Telehealth (HOSPITAL_COMMUNITY): Payer: Self-pay | Admitting: *Deleted

## 2022-01-28 NOTE — Telephone Encounter (Signed)
Reaching out to patient to offer assistance regarding upcoming cardiac imaging study; pt verbalizes understanding of appt date/time, parking situation and where to check in, pre-test NPO status and medications ordered, and verified current allergies; name and call back number provided for further questions should they arise ? ?Gordy Clement RN Navigator Cardiac Imaging ?[facility] ?[phone] office ?[phone] cell ? ?Patient to take '100mg'$  metoprolol tartrate two hours prior to her cardiac CT scan.  She is aware to arrive at 2pm for her 2:30pm scan. ?

## 2022-01-30 ENCOUNTER — Ambulatory Visit (HOSPITAL_COMMUNITY)
Admission: RE | Admit: 2022-01-30 | Discharge: 2022-01-30 | Disposition: A | Discharge status: Home / Self Care | Payer: Medicare (Managed Care) | Source: Ambulatory Visit | Attending: Cardiovascular Disease | Admitting: Cardiovascular Disease

## 2022-01-30 DIAGNOSIS — R072 Precordial pain: Secondary | ICD-10-CM

## 2022-01-30 MED ORDER — NITROGLYCERIN 0.4 MG SL SUBL
SUBLINGUAL_TABLET | SUBLINGUAL | Status: AC
  Filled 2022-01-30: qty 2

## 2022-01-30 MED ORDER — IOHEXOL 350 MG/ML SOLN
100.0000 mL | Freq: Once | INTRAVENOUS | Status: AC | PRN
  Administered 2022-01-30: 100 mL via INTRAVENOUS

## 2022-01-30 MED ORDER — NITROGLYCERIN 0.4 MG SL SUBL
0.8000 mg | SUBLINGUAL_TABLET | Freq: Once | SUBLINGUAL | Status: AC
  Administered 2022-01-30: 0.8 mg via SUBLINGUAL

## 2022-01-31 ENCOUNTER — Encounter: Payer: Self-pay | Admitting: Cardiovascular Disease

## 2022-02-13 ENCOUNTER — Telehealth: Payer: Self-pay | Admitting: Cardiovascular Disease

## 2022-02-13 ENCOUNTER — Ambulatory Visit: Payer: Medicare (Managed Care) | Admitting: Cardiovascular Disease

## 2022-02-13 NOTE — Telephone Encounter (Signed)
New Message: ? ? ? ? ?Patient said she had her test and Dr C called and discussed the results. Her question is does she still need the scheduled appointment for today, which she cancelled, was not feeling well. Does patient  just need an appointment for next year? ?

## 2022-02-13 NOTE — Telephone Encounter (Signed)
Will route to MD/RN to make aware patient cancelled appointment today- mychart message explaining testing is in epic.  ? ?Patient rescheduled to 05/11- patient questioning if this is okay or okay to push out to next year.  ?Thanks! ?

## 2022-02-13 NOTE — Telephone Encounter (Signed)
Contacted patient, I did go and cancel appointment for 05/11- placed recall for one year out.  ?Patient verbalized understanding. ? ?

## 2022-02-13 NOTE — Telephone Encounter (Signed)
OK to push out a year ?

## 2022-03-12 ENCOUNTER — Ambulatory Visit: Payer: Medicare (Managed Care) | Admitting: Cardiovascular Disease

## 2022-03-25 ENCOUNTER — Other Ambulatory Visit: Payer: Self-pay

## 2022-03-25 ENCOUNTER — Emergency Department (HOSPITAL_COMMUNITY)
Admission: EM | Admit: 2022-03-25 | Discharge: 2022-03-26 | Disposition: A | Discharge status: Home / Self Care | Payer: Medicare (Managed Care) | Attending: Emergency Medicine | Admitting: Emergency Medicine

## 2022-03-25 ENCOUNTER — Emergency Department (HOSPITAL_COMMUNITY): Payer: Medicare (Managed Care)

## 2022-03-25 DIAGNOSIS — R569 Unspecified convulsions: Secondary | ICD-10-CM

## 2022-03-25 DIAGNOSIS — X58XXXA Exposure to other specified factors, initial encounter: Secondary | ICD-10-CM | POA: Diagnosis not present

## 2022-03-25 DIAGNOSIS — I10 Essential (primary) hypertension: Secondary | ICD-10-CM | POA: Insufficient documentation

## 2022-03-25 DIAGNOSIS — S0993XA Unspecified injury of face, initial encounter: Secondary | ICD-10-CM | POA: Diagnosis present

## 2022-03-25 DIAGNOSIS — R5383 Other fatigue: Secondary | ICD-10-CM | POA: Diagnosis not present

## 2022-03-25 DIAGNOSIS — E119 Type 2 diabetes mellitus without complications: Secondary | ICD-10-CM | POA: Insufficient documentation

## 2022-03-25 DIAGNOSIS — S01512A Laceration without foreign body of oral cavity, initial encounter: Secondary | ICD-10-CM | POA: Insufficient documentation

## 2022-03-25 DIAGNOSIS — Z79899 Other long term (current) drug therapy: Secondary | ICD-10-CM | POA: Insufficient documentation

## 2022-03-25 DIAGNOSIS — R197 Diarrhea, unspecified: Secondary | ICD-10-CM | POA: Insufficient documentation

## 2022-03-25 DIAGNOSIS — F329 Major depressive disorder, single episode, unspecified: Secondary | ICD-10-CM | POA: Diagnosis present

## 2022-03-25 DIAGNOSIS — R112 Nausea with vomiting, unspecified: Secondary | ICD-10-CM | POA: Diagnosis present

## 2022-03-25 DIAGNOSIS — F13939 Sedative, hypnotic or anxiolytic use, unspecified with withdrawal, unspecified: Secondary | ICD-10-CM | POA: Insufficient documentation

## 2022-03-25 DIAGNOSIS — R1084 Generalized abdominal pain: Secondary | ICD-10-CM | POA: Insufficient documentation

## 2022-03-25 LAB — COMPREHENSIVE METABOLIC PANEL
ALT: 30 U/L (ref 0–44)
AST: 30 U/L (ref 15–41)
Albumin: 3.8 g/dL (ref 3.5–5.0)
Alkaline Phosphatase: 60 U/L (ref 38–126)
Anion gap: 11 (ref 5–15)
BUN: 12 mg/dL (ref 6–20)
CO2: 21 mmol/L — ABNORMAL LOW (ref 22–32)
Calcium: 8.9 mg/dL (ref 8.9–10.3)
Chloride: 105 mmol/L (ref 98–111)
Creatinine, Ser: 0.86 mg/dL (ref 0.44–1.00)
GFR, Estimated: >60 mL/min (ref >60)
Glucose, Bld: 90 mg/dL (ref 70–99)
Potassium: 3.4 mmol/L — ABNORMAL LOW (ref 3.5–5.1)
Sodium: 137 mmol/L (ref 135–145)
Total Bilirubin: 0.7 mg/dL (ref 0.3–1.2)
Total Protein: 6.9 g/dL (ref 6.5–8.1)

## 2022-03-25 LAB — CBC WITH DIFFERENTIAL/PLATELET
Abs Immature Granulocytes: 0.04 10*3/uL (ref 0.00–0.07)
Basophils Absolute: 0 10*3/uL (ref 0.0–0.1)
Basophils Relative: 0 %
Eosinophils Absolute: 0 10*3/uL (ref 0.0–0.5)
Eosinophils Relative: 0 %
HCT: 44 % (ref 36.0–46.0)
Hemoglobin: 15 g/dL (ref 12.0–15.0)
Immature Granulocytes: 0 %
Lymphocytes Relative: 24 %
Lymphs Abs: 2.3 10*3/uL (ref 0.7–4.0)
MCH: 29.6 pg (ref 26.0–34.0)
MCHC: 34.1 g/dL (ref 30.0–36.0)
MCV: 87 fL (ref 80.0–100.0)
Monocytes Absolute: 1 10*3/uL (ref 0.1–1.0)
Monocytes Relative: 11 %
Neutro Abs: 6.1 10*3/uL (ref 1.7–7.7)
Neutrophils Relative %: 65 %
Platelets: 261 10*3/uL (ref 150–400)
RBC: 5.06 MIL/uL (ref 3.87–5.11)
RDW: 12.8 % (ref 11.5–15.5)
WBC: 9.5 10*3/uL (ref 4.0–10.5)
nRBC: 0 % (ref 0.0–0.2)

## 2022-03-25 LAB — I-STAT BETA HCG BLOOD, ED (MC, WL, AP ONLY): I-stat hCG, quantitative: <5 m[IU]/mL (ref <5)

## 2022-03-25 LAB — LIPASE, BLOOD: Lipase: 33 U/L (ref 11–51)

## 2022-03-25 LAB — MAGNESIUM: Magnesium: 1.9 mg/dL (ref 1.7–2.4)

## 2022-03-25 LAB — CBG MONITORING, ED: Glucose-Capillary: 97 mg/dL (ref 70–99)

## 2022-03-25 MED ORDER — SODIUM CHLORIDE 0.9 % IV BOLUS
1000.0000 mL | Freq: Once | INTRAVENOUS | Status: AC
  Administered 2022-03-25: 1000 mL via INTRAVENOUS

## 2022-03-25 MED ORDER — ONDANSETRON HCL 4 MG/2ML IJ SOLN
4.0000 mg | Freq: Once | INTRAMUSCULAR | Status: AC
  Administered 2022-03-25: 4 mg via INTRAVENOUS
  Filled 2022-03-25: qty 2

## 2022-03-25 MED ORDER — LORAZEPAM 2 MG/ML IJ SOLN
1.0000 mg | Freq: Once | INTRAMUSCULAR | Status: AC
  Administered 2022-03-25: 1 mg via INTRAVENOUS
  Filled 2022-03-25: qty 1

## 2022-03-25 NOTE — ED Provider Notes (Signed)
Care of patient handed of to me at this time by tenderly, PA-C.  Please see their note for full HPI, physical exam and work-up to this point.  Briefly, this is a 51 year old female with a history of hypertension, diabetes who presents to the emergency department for new onset seizures.  Additionally she has generalized abdominal pain with nausea and vomiting.  She had a generalized shaking seizure this evening witnessed by her husband.  No recent head trauma, headache, neck pain or fever.  She denies any EtOH, illicit substances.  It is noted that she takes 3-4 Klonopin daily and has been unable to take this for the past 3 to 4 days due to her nausea and vomiting. Physical Exam  BP 130/61   Pulse 85   Temp 98 F (36.7 C) (Oral)   Resp (!) 25   Ht '5\' 7"'$  (1.702 m)   Wt 104.3 kg   SpO2 97%   BMI 36.02 kg/m   Physical Exam  Procedures  Procedures  ED Course / MDM    Medical Decision Making Amount and/or Complexity of Data Reviewed Labs: ordered. Radiology: ordered.  Risk Prescription drug management.   This patient presents to the ED for concern of new onset seizure, this involves an extensive number of treatment options, and is a complaint that carries with it a high risk of complications and morbidity.  The differential diagnosis includes drugs or toxins including alcohol stimulants medication side effects, epilepsy, hyponatremia, hypernatremia, hypoglycemia, acute CNS infection, ICH, tumor, stroke.  Co morbidities that complicate the patient evaluation Daily Klonopin use, hypertension  Additional history obtained:  Additional history obtained from: Obtained originally in HPI by husband External records from outside source obtained and reviewed including: PDMP review  EKG: EKG: normal EKG, normal sinus rhythm.   Cardiac Monitoring: The patient was maintained on a cardiac monitor.  I personally viewed and interpreted the cardiac monitored which showed an underlying rhythm of:  Sinus rhythm  Lab Results: I personally ordered, reviewed, and interpreted labs. Pertinent results include: CMP within normal limits CBC within normal limits Lipase 33, negative Glucose 97 Mag within normal limits Not pregnant UA and UDS ***   Imaging Studies ordered:  I ordered imaging studies which included x-ray and CT.  I independently reviewed & interpreted imaging & am in agreement with radiology impression. Imaging shows: CXR: negative  CT Head  CT Abdomen Pelvis w/ contrast   Medications  Previous provider ordered Ativan and Zofran as well as IVF -I reviewed the patient's home medications and did not make adjustments. -I did not prescribe new home medications.  Tests Considered: Labs and appropriate imaging ordered  Critical Interventions: Ativan for possible benzodiazepine withdrawal   Consultations: I requested consultation with the ***,  and discussed lab and imaging findings as well as pertinent plan - they recommend: ***  SDH Questionable substance abuse from Klonopin   ED Course:     After consideration of the diagnostic results and the patients response to treatment, I feel that the patent would benefit from ***. The patient has been appropriately medically screened and/or stabilized in the ED. I have low suspicion for any other emergent medical condition which would require further screening, evaluation or treatment in the ED or require inpatient management. ***The patient is overall well appearing and non-toxic in appearance. They are hemodynamically stable at time of discharge.

## 2022-03-25 NOTE — ED Triage Notes (Signed)
Pt BIB EMS from home. Pt had witnessed seizure by husband that lasted 3 minutes. Pt has no hx of seizures and does not take any meds. Pt has oral trauma to tongue. When EMS arrived pt was post ictal, lethargic and confused - not answering questions appropriately - Pt now AO but does not remember seizure. EMS gave 500 fluid bolus and zofran.  Pt seen at urgent care earlier today for N/V for the last week and decreased PO intake.  VS with EMS  132/80 HR 96 98% RA  CBG 115

## 2022-03-25 NOTE — ED Provider Notes (Signed)
[name] Provider Note   CSN: [id number] Arrival date & time: 03/25/22  2118     History  Chief Complaint  Patient presents with   Seizures    [name] is a 51 y.o. female history of HTN, DM here for evaluation of seizure-like activity.  Has had 3 days of nausea, vomiting and diarrhea.  Was seen at urgent care earlier today receive antiemetic as well as IV fluids.  She has not generalized abdominal pain.  No recent sick contacts, travel or antibiotic use.  No melena or bright blood per rectum.  Has had some generalized fatigue.  She had witnessed seizure while laying on the bed.  Witnessed by husband.  She had generalized shaking like activity with tongue injury.  No prior history of seizure-like activity.  She denies any headache, numbness, weakness, neck pain, fever.  Does not remember what happened.  EMS gave IV fluids and antiemetic.  No EtOH use, no illicit substance use.  No recent changes in medications.  Of note patient does use Klonopin 1 mg up to 3-4 times daily for anxiety. Has been unable to keep anything down since her emesis  States some days she only has to take 1, some days she does not have to take any however does not typically go this long between taking her Klonopin.  No prior history of benzodiazepine withdrawal  Last filled 2/23 for 360 pills for 90 days Rx  HPI     Home Medications Prior to Admission medications   Medication Sig Start Date End Date Taking? Authorizing Provider  amLODipine (NORVASC) 2.5 MG tablet Take 1 tablet (2.5 mg total) by mouth daily. 01/09/22 04/09/22  Croitoru, Mihai, MD  atorvastatin (LIPITOR) 40 MG tablet Take 40 mg by mouth daily.    [provider]  buPROPion (WELLBUTRIN XL) 300 MG 24 hr tablet Take 300 mg by mouth daily.    [provider]  clonazePAM (KLONOPIN) 1 MG tablet Take 1 mg by mouth 4 (four) times daily. 11/06/17   [provider]  colchicine 0.6 MG tablet  Take by mouth. 08/16/20   [provider]  Dulaglutide (TRULICITY) 0.17 CB/4.4HQ SOPN Inject into the skin. 08/21/20   [provider]  DULoxetine (CYMBALTA) 60 MG capsule Take 60 mg daily by mouth.    [provider]  glucose blood test strip Contour Next Test Strips  USE 1 STRIP TO CHECK GLUCOSE ONCE DAILY    [provider]  hydroxychloroquine (PLAQUENIL) 200 MG tablet Take 400 mg by mouth daily.    [provider]  lamoTRIgine (LAMICTAL) 200 MG tablet 1 tablet 04/11/20   [provider]  lidocaine (XYLOCAINE) 5 % ointment Apply topically. Patient not taking: Reported on 10/29/2020 06/30/20   [provider]  lisdexamfetamine (VYVANSE) 70 MG capsule Take 70 mg by mouth 1 day or 1 dose.    [provider]  losartan (COZAAR) 100 MG tablet Take by mouth. 05/31/20   [provider]  Lurasidone HCl (LATUDA) 60 MG TABS Take by mouth. 10/03/20   [provider]  metoprolol tartrate (LOPRESSOR) 100 MG tablet Take one tablet two hours before the test 01/09/22   Croitoru, Mihai, MD  Microlet Lancets MISC Microlet Lancet  USE 1 TO CHECK GLUCOSE ONCE DAILY    [provider]  omeprazole (PRILOSEC) 40 MG capsule Take 1 capsule (40 mg total) by mouth daily. 10/19/17   Irene Shipper, MD  ondansetron (ZOFRAN-ODT) 8  MG disintegrating tablet Take 1 tablet (8 mg total) by mouth every 6 (six) hours as needed for nausea or vomiting. Patient not taking: Reported on 05/19/2021 11/26/17   Zehr, Janett Billow D, PA-C  polyethylene glycol (MIRALAX / GLYCOLAX) packet Take 17 g by mouth daily. Patient not taking: Reported on 05/19/2021 10/14/16   Drenda Freeze, MD  promethazine (PHENERGAN) 25 MG tablet TAKE 1 TABLET(25 MG) BY MOUTH EVERY 8 HOURS AS NEEDED FOR NAUSEA OR VOMITING Patient not taking: Reported on 05/19/2021 12/22/17   Irene Shipper, MD  silver sulfADIAZINE (SILVADENE) 1 % cream Apply topically. Patient not taking:  Reported on 11/24/2021 06/10/20   [provider]      Allergies    Penicillins, Sulfa antibiotics, and Sulfonamide derivatives    Review of Systems   Review of Systems  Constitutional: Negative.   HENT: Negative.    Respiratory: Negative.    Cardiovascular: Negative.   Gastrointestinal:  Positive for abdominal pain, diarrhea, nausea and vomiting. Negative for abdominal distention, anal bleeding, blood in stool, constipation and rectal pain.  Genitourinary: Negative.   Musculoskeletal: Negative.   Skin: Negative.   Neurological:  Positive for seizures and weakness (generalized).  All other systems reviewed and are negative.  Physical Exam Updated Vital Signs BP 130/61   Pulse 85   Temp 98 F (36.7 C) (Oral)   Resp (!) 25   Ht '5\' 7"'$  (1.702 m)   Wt 104.3 kg   SpO2 97%   BMI 36.02 kg/m  Physical Exam Vitals and nursing note reviewed.  Constitutional:      General: She is not in acute distress.    Appearance: She is well-developed. She is not ill-appearing, toxic-appearing or diaphoretic.  HENT:     Head: Normocephalic and atraumatic.     Jaw: There is normal jaw occlusion.     Comments: No Drooling, dysphagia or trismus    Mouth/Throat:     Lips: Pink.     Mouth: Mucous membranes are moist. Lacerations present.     Pharynx: Oropharynx is clear. Uvula midline.     Comments: 29m laceration to left lateral aspect tongue No loose dentition Eyes:     Pupils: Pupils are equal, round, and reactive to light.  Neck:     Trachea: Trachea and phonation normal.     Comments: No midline tenderness, full range of motion Cardiovascular:     Rate and Rhythm: Normal rate.     Pulses: Normal pulses.          Radial pulses are 2+ on the right side and 2+ on the left side.       Dorsalis pedis pulses are 2+ on the right side and 2+ on the left side.     Heart sounds: Normal heart sounds.  Pulmonary:     Effort: Pulmonary effort is normal. No respiratory distress.     Breath  sounds: Normal breath sounds and air entry.     Comments: Clear bilaterally, speaks in full sentences without difficulty Chest:     Comments: Non tender Abdominal:     General: There is no distension.     Palpations: Abdomen is soft.     Tenderness: There is generalized abdominal tenderness. There is no right CVA tenderness, left CVA tenderness, guarding or rebound.     Comments: Abdomen soft, generalized tenderness without rebound or guarding  Musculoskeletal:        General: Normal range of motion.     Cervical back: Full  passive range of motion without pain and normal range of motion.     Comments: No bony tenderness, full range of motion, compartment soft  Skin:    General: Skin is warm and dry.     Capillary Refill: Capillary refill takes less than 2 seconds.     Comments: Laceration left lateral aspect time  Neurological:     General: No focal deficit present.     Mental Status: She is alert.     Cranial Nerves: Cranial nerves 2-12 are intact.     Sensory: Sensation is intact.     Motor: Motor function is intact.     Coordination: Coordination is intact.     Comments: CN 2-12 grossly intact Equal strength Intact sensation No drift  Psychiatric:        Mood and Affect: Mood normal.    ED Results / Procedures / Treatments   Labs (all labs ordered are listed, but only abnormal results are displayed) Labs Reviewed  COMPREHENSIVE METABOLIC PANEL - Abnormal; Notable for the following components:      Result Value   Potassium 3.4 (*)    CO2 21 (*)    All other components within normal limits  CBC WITH DIFFERENTIAL/PLATELET  LIPASE, BLOOD  MAGNESIUM  URINALYSIS, ROUTINE W REFLEX MICROSCOPIC  I-STAT BETA HCG BLOOD, ED ([facility], [facility], [facility] ONLY)  CBG MONITORING, ED    EKG None  Radiology DG Chest 2 View  Result Date: 03/25/2022 CLINICAL DATA:  Aspiration.  Seizures today.  Dizziness. EXAM: CHEST - 2 VIEW COMPARISON:  10/31/2021 FINDINGS: The heart size and mediastinal  contours are within normal limits. Both lungs are clear. The visualized skeletal structures are unremarkable. IMPRESSION: No active cardiopulmonary disease. Electronically Signed   By: Lucienne Capers M.D.   On: 03/25/2022 22:24    Procedures .Critical Care Performed by: Nettie Elm, PA-C Authorized by: Nettie Elm, PA-C   Critical care provider statement:    Critical care time (minutes):  35   Critical care was necessary to treat or prevent imminent or life-threatening deterioration of the following conditions:  Toxidrome   Critical care was time spent personally by me on the following activities:  Development of treatment plan with patient or surrogate, discussions with consultants, evaluation of patient's response to treatment, examination of patient, ordering and review of laboratory studies, ordering and review of radiographic studies, ordering and performing treatments and interventions, pulse oximetry, re-evaluation of patient's condition and review of old charts    Medications Ordered in ED Medications  sodium chloride 0.9 % bolus 1,000 mL (1,000 mLs Intravenous New Bag/Given 03/25/22 2153)  ondansetron (ZOFRAN) injection 4 mg (4 mg Intravenous Given 03/25/22 2152)  LORazepam (ATIVAN) injection 1 mg (1 mg Intravenous Given 03/25/22 2245)   ED Course/ Medical Decision Making/ A&P    51 year old history of diabetes, pretension here for evaluation of new onset seizure-like activity.  Witnessed by husband with approximately 2 minutes generalized tonic-clonic seizure, patient with tongue injury not amenable to laceration repair.  No bowel or bladder incontinence.  No history of seizure-like activity.  No recent medication changes, traumatic injuries.  Of note patient has had 3 days of nausea, vomiting, diarrhea unable to keep down any medications at home. Takes Klonopin 3 times daily and has been unable to keep down meds since recent illness.  Labs and imaging personally viewed  and interpreted:  CBC without leukocytosis Leukocytosis potassium 3.4 Mag1.9 Pregnancy test negative Lipase DG chest without acute abnormality EKG without  ischemic changes  Question whether patient is having benzodiazepine withdrawal given her frequency and taking her Klonopin and unable to keep this down due to her nausea and vomiting over the last 3 to 4 days.  Was given Ativan here.  Care transferred to [facility] who will FU on imaging and determine disposition.                           Medical Decision Making Amount and/or Complexity of Data Reviewed Independent Historian: EMS External Data Reviewed: labs, radiology, ECG and notes. Labs: ordered. Decision-making details documented in ED Course. Radiology: ordered and independent interpretation performed. Decision-making details documented in ED Course. ECG/medicine tests: ordered and independent interpretation performed. Decision-making details documented in ED Course.  Risk OTC drugs. Prescription drug management. Parenteral controlled substances. Decision regarding hospitalization. Diagnosis or treatment significantly limited by social determinants of health.         Final Clinical Impression(s) / ED Diagnoses Final diagnoses:  Seizure-like activity ([place])  Benzodiazepine withdrawal with complication ([place])  Nausea vomiting and diarrhea    Rx / DC Orders ED Discharge Orders     None         Jalan Fariss A, PA-C 03/25/22 2333    Lacretia Leigh, MD 03/26/22 1615

## 2022-03-26 ENCOUNTER — Emergency Department (HOSPITAL_COMMUNITY): Payer: Medicare (Managed Care)

## 2022-03-26 DIAGNOSIS — G4733 Obstructive sleep apnea (adult) (pediatric): Secondary | ICD-10-CM | POA: Insufficient documentation

## 2022-03-26 DIAGNOSIS — F329 Major depressive disorder, single episode, unspecified: Secondary | ICD-10-CM

## 2022-03-26 DIAGNOSIS — R569 Unspecified convulsions: Secondary | ICD-10-CM | POA: Diagnosis not present

## 2022-03-26 DIAGNOSIS — R112 Nausea with vomiting, unspecified: Secondary | ICD-10-CM | POA: Diagnosis not present

## 2022-03-26 LAB — URINALYSIS, ROUTINE W REFLEX MICROSCOPIC
Bilirubin Urine: NEGATIVE
Glucose, UA: NEGATIVE mg/dL
Hgb urine dipstick: NEGATIVE
Ketones, ur: 5 mg/dL — AB
Leukocytes,Ua: NEGATIVE
Nitrite: NEGATIVE
Protein, ur: 100 mg/dL — AB
Specific Gravity, Urine: 1.045 — ABNORMAL HIGH (ref 1.005–1.030)
pH: 5 (ref 5.0–8.0)

## 2022-03-26 LAB — RAPID URINE DRUG SCREEN, HOSP PERFORMED
Amphetamines: POSITIVE — AB
Barbiturates: NOT DETECTED
Benzodiazepines: NOT DETECTED
Cocaine: NOT DETECTED
Opiates: NOT DETECTED
Tetrahydrocannabinol: POSITIVE — AB

## 2022-03-26 MED ORDER — LIDOCAINE VISCOUS HCL 2 % MT SOLN
15.0000 mL | Freq: Once | OROMUCOSAL | Status: AC
  Administered 2022-03-26: 15 mL via OROMUCOSAL
  Filled 2022-03-26: qty 15

## 2022-03-26 MED ORDER — CLONAZEPAM 0.5 MG PO TABS
1.0000 mg | ORAL_TABLET | Freq: Once | ORAL | Status: AC
  Administered 2022-03-26: 1 mg via ORAL
  Filled 2022-03-26: qty 2

## 2022-03-26 MED ORDER — ONDANSETRON HCL 4 MG PO TABS: 4.0000 mg | ORAL_TABLET | Freq: Four times a day (QID) | ORAL | 0 refills | Status: DC

## 2022-03-26 MED ORDER — IOHEXOL 300 MG/ML  SOLN
100.0000 mL | Freq: Once | INTRAMUSCULAR | Status: AC | PRN
  Administered 2022-03-26: 100 mL via INTRAVENOUS

## 2022-03-26 NOTE — Assessment & Plan Note (Signed)
Likely due to viral illness. Improved with IVF and IV zofran 4 mg. Send home with zofran 4 mg.

## 2022-03-26 NOTE — Discharge Instructions (Addendum)
You were seen in the emergency department today for seizure-like activity.  Your work-up here has been reassuring.  We took a picture of your brain as well as labs which were all normal and reassuring.  Regarding her nausea and vomiting is likely have a viral gastroenteritis.  I prescribed you Zofran that you can take every 6 hours as needed for nausea and vomiting.  Additionally I have sent a referral to neurology to follow-up on your seizure.  As the hospitalist discussed with you this may be related to sleep deprivation and may be related to not taking your Klonopin.  Please return to the emergency department if you have another seizure.  Please do not drive for the next 6 months.

## 2022-03-26 NOTE — Assessment & Plan Note (Signed)
Probably multi-factorial including lack of sleep for 5 days, no clonazepam dose in 5 days, N/V/lack of food/glucose in 3 days.  After IVF and IV zofran, pt's nausea has improved. Vicious lidocaine has helped with tongue trauma. Pt able to eat applesauce and drink some apple juice.  Will give dose of po klonopin 1 mg now.  Advised pt to take her seroquel 25 mg when she gets home so she can get a full 10 hours of sleep. Pt c/o that seroquel make her feel very drowsy the next day.  However given the lack of sleep for 5 days, advised pt and her husband that daytime one episode of daytime drowsiness is worth the benefit of a full night's rest. Discussed with EDP about sending pt home with Rx for Vicious lidocaine and zofran 4 mg.  No indication for hospital admission at this time.

## 2022-03-26 NOTE — ED Notes (Signed)
Dr. Chen at bedside.

## 2022-03-26 NOTE — Subjective & Objective (Addendum)
CC: seizure like activity HPI: 51 yo WF with hx of type 2 diabetes, OSA, obesity with a BMI 36, fibromyalgia, hypertension, major depression presents to the ER today with a witnessed seizure-like activity at home.  Patient's with her husband Marden Noble.  He states that for the last 6 days, the patient has had nausea and a poor appetite.  She had several episodes of vomiting over the weekend.  Husband states that patient only takes her Klonopin 1 mg tablets only twice a day on Monday Wednesdays and Fridays.  Although she gets 360 tablets every 3 months (1 tablet 4 times a day).  She does not use all these tablets.  Patient has not been eating or drinking well.  Husband has been trying to force her to drink.  Husband also states the patient has not slept in about 5 days.  Wife states that she has taken Seroquel in the past 25 mg but this usually makes her so sleepy the next day that she does not like to take it.  She states that she will sleep about 10 hours after taking Seroquel.  On a normal basis, the patient only sleeps about 4 hours at a time according to the husband.  He states that yesterday, he asked his wife to come to the front porch to enjoy the outside since she has been feeling so poorly over the weekend.  Patient had been seen in urgent care yesterday for IV fluids.  Husband states the patient was feeling better after spending some time on the front porch.  They decided to go upstairs to try to have sex.  While they were kissing each other, the patient had a seizure-like episode.  Patient bit her tongue.  Husband called 29.  By the time EMS arrived, patient was awake but confused.  EMS did not have to administer any Versed.  Work-up in the ER is unremarkable except for urinalysis with specific gravity of 1.045.  CT head was negative for acute intracranial abnormality.  CT abdomen pelvis was negative.  Due to the patient's apparent seizure-like activity today, [facility] hospitalist  consulted.  In the ER, patient given IV fluids.  IV Zofran.  Patient is now no longer nauseated.  She is able to drink fluids.  She complains of a sore tongue on the left side where she bit her tongue.

## 2022-03-26 NOTE — ED Notes (Signed)
Patient verbalizes understanding of discharge instructions. Opportunity for questioning and answers were provided. Armband removed by staff, pt discharged from ED pt ambulatory to lobby

## 2022-03-26 NOTE — H&P (Signed)
Hospitalist Consultation History and Physical    [name] ZOX:[id number] DOB: 11/02/1971 DOA: 03/25/2022  DOS: the patient was seen and examined on 03/25/2022  PCP: Aleen Campi, NP   Patient coming from: Home  I have personally briefly reviewed patient's old medical records in [place]  CC: seizure like activity HPI: 51 yo WF with hx of type 2 diabetes, OSA, obesity with a BMI 36, fibromyalgia, hypertension, major depression presents to the ER today with a witnessed seizure-like activity at home.  Patient's with her husband Marden Noble.  He states that for the last 6 days, the patient has had nausea and a poor appetite.  She had several episodes of vomiting over the weekend.  Husband states that patient only takes her Klonopin 1 mg tablets only twice a day on Monday Wednesdays and Fridays.  Although she gets 360 tablets every 3 months (1 tablet 4 times a day).  She does not use all these tablets.  Patient has not been eating or drinking well.  Husband has been trying to force her to drink.  Husband also states the patient has not slept in about 5 days.  Wife states that she has taken Seroquel in the past 25 mg but this usually makes her so sleepy the next day that she does not like to take it.  She states that she will sleep about 10 hours after taking Seroquel.  On a normal basis, the patient only sleeps about 4 hours at a time according to the husband.  He states that yesterday, he asked his wife to come to the front porch to enjoy the outside since she has been feeling so poorly over the weekend.  Patient had been seen in urgent care yesterday for IV fluids.  Husband states the patient was feeling better after spending some time on the front porch.  They decided to go upstairs to try to have sex.  While they were kissing each other, the patient had a seizure-like episode.  Patient bit her tongue.  Husband called 6.  By the time EMS arrived, patient was awake but confused.  EMS  did not have to administer any Versed.  Work-up in the ER is unremarkable except for urinalysis with specific gravity of 1.045.  CT head was negative for acute intracranial abnormality.  CT abdomen pelvis was negative.  Due to the patient's apparent seizure-like activity today, [facility] hospitalist consulted.  In the ER, patient given IV fluids.  IV Zofran.  Patient is now no longer nauseated.  She is able to drink fluids.  She complains of a sore tongue on the left side where she bit her tongue.   ED Course: Given IV fluids and IV Zofran.  CT head negative.  CT abdomen negative.  Other lab work within normal limits except for a concentrated urine sample.  Potassium little bit low at 3.4.  Review of Systems:  Review of Systems  Constitutional:  Positive for malaise/fatigue.       Anorexia for 6 days.  HENT: Negative.    Respiratory: Negative.    Cardiovascular: Negative.   Gastrointestinal:  Positive for nausea and vomiting.  Genitourinary: Negative.   Musculoskeletal: Negative.   Skin: Negative.   Neurological:  Positive for seizures.       Unable to sleep for 5 days. Husband witnessed a seizure-like episode at home.  Patient has no prior history of seizures.  Endo/Heme/Allergies: Negative.   Psychiatric/Behavioral:  Positive for depression.   All other systems reviewed  and are negative.  Past Medical History:  Diagnosis Date   Allergy    Anemia    Anxiety    Depression    Diabetes (HCC)    Elevated LFTs    Fibromyalgia    Hypercholesteremia    Hypertension    Hypomagnesemia    Morbid obesity ([place])    Sjoegren syndrome     Past Surgical History:  Procedure Laterality Date   COLON SURGERY     ECT TREATMENT     ESSURE TUBAL LIGATION     TONSILLECTOMY     UPPER GASTROINTESTINAL ENDOSCOPY     WISDOM TOOTH EXTRACTION       reports that she quit smoking about 29 years ago. Her smoking use included cigarettes. She has never used smokeless tobacco. She reports that she  does not drink alcohol and does not use drugs.  Allergies  Allergen Reactions   Penicillins Hives   Sulfa Antibiotics Hives   Sulfonamide Derivatives Hives    Family History  Problem Relation Age of Onset   Multiple sclerosis Mother    Diabetes Mother        Juvenile   Psoriasis Father    Prostate cancer Father    Diabetes Father    Lupus Paternal Aunt    Breast cancer Paternal Aunt    Rheum arthritis Paternal Aunt    Breast cancer Paternal Aunt    Colon cancer Neg Hx    Esophageal cancer Neg Hx    Rectal cancer Neg Hx    Stomach cancer Neg Hx     Prior to Admission medications   Medication Sig Start Date End Date Taking? Authorizing Provider  acetaminophen (TYLENOL) 500 MG tablet Take 500 mg by mouth every 6 (six) hours as needed for moderate pain or headache.   Yes [provider]  ALPRAZolam Duanne Moron) 1 MG tablet Take 1 mg by mouth 4 (four) times daily as needed for anxiety. 03/19/22  Yes [provider]  amLODipine (NORVASC) 2.5 MG tablet Take 1 tablet (2.5 mg total) by mouth daily. 01/09/22 04/09/22 Yes Croitoru, Mihai, MD  aspirin EC 81 MG tablet Take 81 mg by mouth daily. Swallow whole.   Yes [provider]  atorvastatin (LIPITOR) 40 MG tablet Take 40 mg by mouth daily.   Yes [provider]  buPROPion (WELLBUTRIN XL) 300 MG 24 hr tablet Take 300 mg by mouth daily. 03/19/22  Yes [provider]  cevimeline (EVOXAC) 30 MG capsule Take 30 mg by mouth daily as needed (dry mouth). 01/08/22  Yes [provider]  colchicine 0.6 MG tablet Take 0.6 mg by mouth daily. 08/16/20  Yes [provider]  DULoxetine (CYMBALTA) 60 MG capsule Take 60 mg daily by mouth.   Yes [provider]  hydroxychloroquine (PLAQUENIL) 200 MG tablet Take 400 mg by mouth daily.   Yes [provider]  lamoTRIgine (LAMICTAL) 200 MG tablet Take 400 mg by mouth daily. 04/11/20  Yes [provider]  lisdexamfetamine (VYVANSE)  70 MG capsule Take 70 mg by mouth daily.   Yes [provider]  losartan (COZAAR) 100 MG tablet Take 100 mg by mouth daily. 05/31/20  Yes [provider]  Lurasidone HCl (LATUDA) 60 MG TABS Take 60 mg by mouth daily. 10/03/20  Yes [provider]  omeprazole (PRILOSEC) 40 MG capsule Take 1 capsule (40 mg total) by mouth daily. 10/19/17  Yes Irene Shipper, MD  TRULICITY 1.5 NA/3.5TD SOPN Inject 1.5 mg into the  skin every Wednesday. 03/04/22  Yes [provider]  glucose blood test strip Contour Next Test Strips  USE 1 STRIP TO CHECK GLUCOSE ONCE DAILY    [provider]  metoprolol tartrate (LOPRESSOR) 100 MG tablet Take one tablet two hours before the test Patient not taking: Reported on 03/26/2022 01/09/22   Croitoru, Dani Gobble, MD  Microlet Lancets MISC Microlet Lancet  USE 1 TO CHECK GLUCOSE ONCE DAILY    [provider]    Physical Exam: Vitals:   03/26/22 0115 03/26/22 0130 03/26/22 0145 03/26/22 0200  BP: 137/65 138/74 124/73   Pulse: 88 91 89 85  Resp: (!) 26 20 (!) 26 14  Temp:      TempSrc:      SpO2: 98% 98% 98% 97%  Weight:      Height:        Physical Exam Vitals and nursing note reviewed.  Constitutional:      General: She is not in acute distress.    Appearance: She is obese. She is not ill-appearing, toxic-appearing or diaphoretic.  HENT:     Head: Normocephalic and atraumatic.     Nose: Nose normal.     Mouth/Throat:   Cardiovascular:     Rate and Rhythm: Normal rate and regular rhythm.  Pulmonary:     Effort: Pulmonary effort is normal. No respiratory distress.     Breath sounds: No wheezing or rales.  Abdominal:     General: Bowel sounds are normal. There is no distension.     Tenderness: There is no abdominal tenderness. There is no guarding.  Musculoskeletal:     Right lower leg: No edema.     Left lower leg: No edema.  Skin:    General: Skin is warm and dry.     Capillary Refill: Capillary refill takes  less than 2 seconds.  Neurological:     General: No focal deficit present.     Mental Status: She is alert and oriented to person, place, and time.     Labs on Admission: I have personally reviewed following labs and imaging studies  CBC: Recent Labs  Lab 03/25/22 2133  WBC 9.5  NEUTROABS 6.1  HGB 15.0  HCT 44.0  MCV 87.0  PLT 778   Basic Metabolic Panel: Recent Labs  Lab 03/25/22 2133  NA 137  K 3.4*  CL 105  CO2 21*  GLUCOSE 90  BUN 12  CREATININE 0.86  CALCIUM 8.9  MG 1.9   GFR: Estimated Creatinine Clearance: 97.2 mL/min (by C-G formula based on SCr of 0.86 mg/dL). Liver Function Tests: Recent Labs  Lab 03/25/22 2133  AST 30  ALT 30  ALKPHOS 60  BILITOT 0.7  PROT 6.9  ALBUMIN 3.8   Recent Labs  Lab 03/25/22 2133  LIPASE 33   No results for input(s): AMMONIA in the last 168 hours. Coagulation Profile: No results for input(s): INR, PROTIME in the last 168 hours. Cardiac Enzymes: No results for input(s): CKTOTAL, CKMB, CKMBINDEX, TROPONINI, TROPONINIHS in the last 168 hours. BNP (last 3 results) No results for input(s): PROBNP in the last 8760 hours. HbA1C: No results for input(s): HGBA1C in the last 72 hours. CBG: Recent Labs  Lab 03/25/22 2135  GLUCAP 97   Lipid Profile: No results for input(s): CHOL, HDL, LDLCALC, TRIG, CHOLHDL, LDLDIRECT in the last 72 hours. Thyroid Function Tests: No results for input(s): TSH, T4TOTAL, FREET4, T3FREE, THYROIDAB in the last 72 hours. Anemia Panel: No results for input(s): VITAMINB12,  FOLATE, FERRITIN, TIBC, IRON, RETICCTPCT in the last 72 hours. Urine analysis:    Component Value Date/Time   COLORURINE YELLOW 03/26/2022 0107   APPEARANCEUR CLEAR 03/26/2022 0107   LABSPEC 1.045 (H) 03/26/2022 0107   PHURINE 5.0 03/26/2022 0107   GLUCOSEU NEGATIVE 03/26/2022 0107   HGBUR NEGATIVE 03/26/2022 0107   BILIRUBINUR NEGATIVE 03/26/2022 0107   KETONESUR 5 (A) 03/26/2022 0107   PROTEINUR 100 (A) 03/26/2022  0107   UROBILINOGEN 1.0 12/05/2011 1327   NITRITE NEGATIVE 03/26/2022 0107   LEUKOCYTESUR NEGATIVE 03/26/2022 0107    Radiological Exams on Admission: I have personally reviewed images DG Chest 2 View  Result Date: 03/25/2022 CLINICAL DATA:  Aspiration.  Seizures today.  Dizziness. EXAM: CHEST - 2 VIEW COMPARISON:  10/31/2021 FINDINGS: The heart size and mediastinal contours are within normal limits. Both lungs are clear. The visualized skeletal structures are unremarkable. IMPRESSION: No active cardiopulmonary disease. Electronically Signed   By: Lucienne Capers M.D.   On: 03/25/2022 22:24   CT HEAD WO CONTRAST (5MM)  Result Date: 03/26/2022 CLINICAL DATA:  Seizure, new-onset, no history of trauma EXAM: CT HEAD WITHOUT CONTRAST TECHNIQUE: Contiguous axial images were obtained from the base of the skull through the vertex without intravenous contrast. RADIATION DOSE REDUCTION: This exam was performed according to the departmental dose-optimization program which includes automated exposure control, adjustment of the mA and/or kV according to patient size and/or use of iterative reconstruction technique. COMPARISON:  None Available. FINDINGS: Brain: Normal anatomic configuration. No abnormal intra or extra-axial mass lesion or fluid collection. No abnormal mass effect or midline shift. No evidence of acute intracranial hemorrhage or infarct. Ventricular size is normal. Cerebellum unremarkable. Vascular: Unremarkable Skull: Intact Sinuses/Orbits: Paranasal sinuses are clear. Orbits are unremarkable. Other: Mastoid air cells and middle ear cavities are clear. IMPRESSION: No acute intracranial abnormality.  No calvarial fracture. Electronically Signed   By: Fidela Salisbury M.D.   On: 03/26/2022 00:44   CT ABDOMEN PELVIS W CONTRAST  Result Date: 03/26/2022 CLINICAL DATA:  Nausea, vomiting EXAM: CT ABDOMEN AND PELVIS WITH CONTRAST TECHNIQUE: Multidetector CT imaging of the abdomen and pelvis was performed  using the standard protocol following bolus administration of intravenous contrast. RADIATION DOSE REDUCTION: This exam was performed according to the departmental dose-optimization program which includes automated exposure control, adjustment of the mA and/or kV according to patient size and/or use of iterative reconstruction technique. CONTRAST:  192m OMNIPAQUE IOHEXOL 300 MG/ML  SOLN COMPARISON:  None Available. FINDINGS: Lower chest: No acute abnormality. Hepatobiliary: No focal liver abnormality is seen. No gallstones, gallbladder wall thickening, or biliary dilatation. Pancreas: Unremarkable Spleen: Unremarkable Adrenals/Urinary Tract: Adrenal glands are unremarkable. Kidneys are normal, without renal calculi, focal lesion, or hydronephrosis. Bladder is unremarkable. Stomach/Bowel: Stomach is within normal limits. Appendix appears normal. No evidence of bowel wall thickening, distention, or inflammatory changes. Vascular/Lymphatic: No significant vascular findings are present. No enlarged abdominal or pelvic lymph nodes. Reproductive: Bilateral Essure implants are in place. Pelvic organs are otherwise unremarkable. Other: Tiny fat containing umbilical hernia.  Rectum unremarkable. Musculoskeletal: Degenerative changes are noted within the lumbar spine and hips bilaterally. No acute bone abnormality. No lytic or blastic bone lesion. IMPRESSION: No acute intra-abdominal pathology identified. No definite radiographic explanation for the patient's reported symptoms. Electronically Signed   By: AFidela SalisburyM.D.   On: 03/26/2022 00:40    EKG: My personal interpretation of EKG shows: NSR     Assessment/Plan Principal Problem:   Observed seizure-like activity (HCC) Active Problems:  NAUSEA AND VOMITING   Major depression, single episode    Assessment and Plan: * Observed seizure-like activity ([place]) Probably multi-factorial including lack of sleep for 5 days, no clonazepam dose in 5 days, N/V/lack  of food/glucose in 3 days.  After IVF and IV zofran, pt's nausea has improved. Vicious lidocaine has helped with tongue trauma. Pt able to eat applesauce and drink some apple juice.  Will give dose of po klonopin 1 mg now.  Advised pt to take her seroquel 25 mg when she gets home so she can get a full 10 hours of sleep. Pt c/o that seroquel make her feel very drowsy the next day.  However given the lack of sleep for 5 days, advised pt and her husband that daytime one episode of daytime drowsiness is worth the benefit of a full night's rest. Discussed with EDP about sending pt home with Rx for Vicious lidocaine and zofran 4 mg.  No indication for hospital admission at this time.  NAUSEA AND VOMITING Likely due to viral illness. Improved with IVF and IV zofran 4 mg. Send home with zofran 4 mg.  Major depression, single episode Pt on Vyvanse 70 mg qday. Advised her to eat a meal before taking Vyvanse.   Family Communication: discussed with pt and husband at bedside  Disposition Plan: return home  Admission status:  admission deferred ,  no indication for hospital admission. Discussed with EDP. If pt tolerating PO liquids/solid, may discharge her at 3 am.   Kristopher Oppenheim, DO [facility] 03/26/2022, 2:15 AM

## 2022-03-26 NOTE — ED Notes (Signed)
Pt was given a bag lunch, applesauce, and a drink

## 2022-03-26 NOTE — ED Notes (Signed)
Pt was able to ambulate independently to the bathroom. She was a little shaky, but states she has been since she had gotten sick.

## 2022-03-26 NOTE — Assessment & Plan Note (Signed)
Pt on Vyvanse 70 mg qday. Advised her to eat a meal before taking Vyvanse.

## 2022-03-31 ENCOUNTER — Encounter: Payer: Self-pay | Admitting: Family

## 2022-04-08 ENCOUNTER — Ambulatory Visit: Payer: Medicare (Managed Care) | Admitting: Neurology

## 2022-04-08 ENCOUNTER — Encounter: Payer: Self-pay | Admitting: Neurology

## 2022-04-08 ENCOUNTER — Encounter: Payer: Self-pay | Admitting: Family

## 2022-04-08 VITALS — BP 152/91 | HR 84 | Ht 67.0 in | Wt 239.5 lb

## 2022-04-08 DIAGNOSIS — R269 Unspecified abnormalities of gait and mobility: Secondary | ICD-10-CM

## 2022-04-08 DIAGNOSIS — R809 Proteinuria, unspecified: Secondary | ICD-10-CM

## 2022-04-08 DIAGNOSIS — F329 Major depressive disorder, single episode, unspecified: Secondary | ICD-10-CM

## 2022-04-08 DIAGNOSIS — E1129 Type 2 diabetes mellitus with other diabetic kidney complication: Secondary | ICD-10-CM | POA: Diagnosis not present

## 2022-04-08 DIAGNOSIS — R569 Unspecified convulsions: Secondary | ICD-10-CM

## 2022-04-08 NOTE — Progress Notes (Signed)
[facility]  PATIENT: [name] DOB: [birth year]  REFERRING DOCTOR OR PCP: Helyn Numbers, NP SOURCE: Patient, notes from the emergency room, imaging and lab reports, CT scan images personally reviewed.  _________________________________   HISTORICAL  CHIEF COMPLAINT:  Chief Complaint  Patient presents with   New Patient (Initial Visit)    Rm 2, w husband. ED referral for new onset of sz.     HISTORY OF PRESENT ILLNESS:  I had the pleasure seeing patient, [name], at [facility] for neurologic consultation regarding her recent seizure.  She is a 51 year old woman who had the first seizure of her life 03/26/2022.  She had a generalized tonic-clonic seizure witnessed by her husband..   She was downstairs and felt lightheaded and told her husband she wasn't feeling right (like when she had ketamine).   They walked upstairs and lay down in bed.  She last recalled looking at the fan over her bed.    Her next memory was EMT arriving to the house standing over.Marland Kitchen   Her husband witnessed the entire seizure.  At the onset, her eyes widened and then her arms started punching in the air and her arms twisted.    Her legs were striahgt ut but jerking.   She was grunting.   It lasted 3-4 minutes.   She bit her tongue but did not lose her urine. Marland Kitchen     She was confused afterwards for 30 minutes or so and became agitated when EMT arrived but was able to talk (why are these people here and became calm after a minute).   She was conversant in the ambulance.  Shortly after she got to the emergency room, she felt that she was back to baseline.  She has never had another seizure .     She has severe depression.  She was on Wellbutrin at the time (300 mg XL daily) and Latuda 60 mg at the time of seizure.    She was also on lamotrigine 200 mg po bid.     She had no medication changes in the preceding weeks.   She was also on alprazolam but had no withdrawal.    She has  had ECT in 2009 and 2015.   She did ketamine for pain earlier this year.    She had a GI syndrome and had been eating and drinking poorly the week and a seizure. She had vomiting (more dry heaves the day of the seizure) and some sweats and ate poorly all week.    She has type 2 DM and took all her medications (Trulicity).  Glucose was not checked the day she had a seizure..   Sugars have been in the 50's in the past but not tested that day until she went to the ED after the seizure (was 90 at that time).   She also has depression and was sleeping very poorly - very little sleep the previous 4 nights.      She had no fever.     Images personally reviewed:  CT scan 03/26/2022 was normal.  REVIEW OF SYSTEMS: Constitutional: No fevers, chills, sweats, or change in appetite.  She has insomnia. Eyes: No visual changes, double vision, eye pain Ear, nose and throat: No hearing loss, ear pain, nasal congestion, sore throat Cardiovascular: No chest pain, palpitations Respiratory:  No shortness of breath at rest or with exertion.   No wheezes GastrointestinaI: No nausea, vomiting, diarrhea, abdominal pain, fecal incontinence Genitourinary:  No dysuria, urinary retention or frequency.  No nocturia. Musculoskeletal: She has chronic pain. Integumentary: No rash, pruritus, skin lesions Neurological: as above Psychiatric: She has had difficult to treat depression and has some anxiety.   Endocrine: She has type II non-insulin-dependent diabetes mellitus. Hematologic/Lymphatic:  No anemia, purpura, petechiae. Allergic/Immunologic: No itchy/runny eyes, nasal congestion, recent allergic reactions, rashes  ALLERGIES: Allergies  Allergen Reactions   Penicillins Hives   Sulfa Antibiotics Hives   Sulfonamide Derivatives Hives    HOME MEDICATIONS:  Current Outpatient Medications:    ALPRAZolam (XANAX) 1 MG tablet, Take 1 mg by mouth 4 (four) times daily as needed for anxiety., Disp: , Rfl:    amLODipine  (NORVASC) 2.5 MG tablet, Take 1 tablet (2.5 mg total) by mouth daily., Disp: 90 tablet, Rfl: 3   aspirin EC 81 MG tablet, Take 81 mg by mouth daily. Swallow whole., Disp: , Rfl:    atorvastatin (LIPITOR) 40 MG tablet, Take 40 mg by mouth daily., Disp: , Rfl:    buPROPion (WELLBUTRIN XL) 300 MG 24 hr tablet, Take 300 mg by mouth daily., Disp: , Rfl:    cevimeline (EVOXAC) 30 MG capsule, Take 30 mg by mouth daily as needed (dry mouth)., Disp: , Rfl:    colchicine 0.6 MG tablet, Take 0.6 mg by mouth daily., Disp: , Rfl:    DULoxetine (CYMBALTA) 60 MG capsule, Take 60 mg daily by mouth., Disp: , Rfl:    glucose blood test strip, Contour Next Test Strips  USE 1 STRIP TO CHECK GLUCOSE ONCE DAILY, Disp: , Rfl:    hydroxychloroquine (PLAQUENIL) 200 MG tablet, Take 400 mg by mouth daily., Disp: , Rfl:    lamoTRIgine (LAMICTAL) 200 MG tablet, Take 400 mg by mouth daily., Disp: , Rfl:    lisdexamfetamine (VYVANSE) 70 MG capsule, Take 70 mg by mouth daily., Disp: , Rfl:    losartan (COZAAR) 100 MG tablet, Take 100 mg by mouth daily., Disp: , Rfl:    Lurasidone HCl (LATUDA) 60 MG TABS, Take 60 mg by mouth daily., Disp: , Rfl:    Microlet Lancets MISC, Microlet Lancet  USE 1 TO CHECK GLUCOSE ONCE DAILY, Disp: , Rfl:    omeprazole (PRILOSEC) 40 MG capsule, Take 1 capsule (40 mg total) by mouth daily., Disp: 90 capsule, Rfl: 3   TRULICITY 1.5 IE/3.3IR SOPN, Inject 1.5 mg into the skin every Wednesday., Disp: , Rfl:   PAST MEDICAL HISTORY: Past Medical History:  Diagnosis Date   Allergy    Anemia    Anxiety    Depression    Diabetes ([place])    Elevated LFTs    Fibromyalgia    Hypercholesteremia    Hypertension    Hypomagnesemia    Morbid obesity ([place])    Seizures ([place])    Sjoegren syndrome     PAST SURGICAL HISTORY: Past Surgical History:  Procedure Laterality Date   COLON SURGERY     ECT TREATMENT     ESSURE TUBAL LIGATION     TONSILLECTOMY     UPPER GASTROINTESTINAL ENDOSCOPY     WISDOM  TOOTH EXTRACTION      FAMILY HISTORY: Family History  Problem Relation Age of Onset   Multiple sclerosis Mother    Diabetes Mother        Juvenile   Psoriasis Father    Prostate cancer Father    Diabetes Father    Lupus Paternal Aunt    Breast cancer Paternal Aunt    Rheum arthritis Paternal Aunt  Breast cancer Paternal Aunt    Colon cancer Neg Hx    Esophageal cancer Neg Hx    Rectal cancer Neg Hx    Stomach cancer Neg Hx     SOCIAL HISTORY:  Social History   Socioeconomic History   Marital status: Married    Spouse name: Not on file   Number of children: 2   Years of education: Not on file   Highest education level: Not on file  Occupational History   Occupation: disabled  Tobacco Use   Smoking status: Former    Types: Cigarettes    Quit date: 1994    Years since quitting: 29.4   Smokeless tobacco: Never  Vaping Use   Vaping Use: Never used  Substance and Sexual Activity   Alcohol use: No    Alcohol/week: 0.0 standard drinks   Drug use: No   Sexual activity: Not on file  Other Topics Concern   Not on file  Social History Narrative   Lives at home w husband   R handed   Caffeine: 1 C coffee a day.    Social Determinants of Health   Financial Resource Strain: Not on file  Food Insecurity: Not on file  Transportation Needs: Not on file  Physical Activity: Not on file  Stress: Not on file  Social Connections: Not on file  Intimate Partner Violence: Not on file     PHYSICAL EXAM  Vitals:   04/08/22 1535  BP: (!) 152/91  Pulse: 84  Weight: 239 lb 8 oz (108.6 kg)  Height: '5\' 7"'$  (1.702 m)    Body mass index is 37.51 kg/m.   General: The patient is well-developed and well-nourished and in no acute distress  HEENT:  Head is [place]/AT.  Sclera are anicteric.  Funduscopic exam shows normal optic discs and retinal vessels.  Neck: No carotid bruits are noted.  The neck is nontender.  Cardiovascular: The heart has a regular rate and rhythm with a  normal S1 and S2. There were no murmurs, gallops or rubs.    Skin: Extremities are without rash or  edema.  Musculoskeletal:  Back is nontender  Neurologic Exam  Mental status: The patient is alert and oriented x 3 at the time of the examination. The patient has apparent normal recent and remote memory, with an apparently normal attention span and concentration ability.   Speech is normal.  Cranial nerves: Extraocular movements are full. Pupils are equal, round, and reactive to light and accomodation.  Visual fields are full.  Facial symmetry is present. There is good facial sensation to soft touch bilaterally.Facial strength is normal.  Trapezius and sternocleidomastoid strength is normal. No dysarthria is noted.  The tongue is midline, and the patient has symmetric elevation of the soft palate. No obvious hearing deficits are noted.  Motor:  Muscle bulk is normal.   Tone is normal. Strength is  5 / 5 in all 4 extremities.   Sensory: Sensory testing is intact to pinprick, soft touch and vibration sensation in all 4 extremities.  Coordination: Cerebellar testing reveals good finger-nose-finger and heel-to-shin bilaterally.  Gait and station: Station is normal.   Gait is normal. Tandem gait is mildly wide. Romberg is negative.   Reflexes: Deep tendon reflexes are symmetric and normal bilaterally.   Plantar responses are flexor.    DIAGNOSTIC DATA (LABS, IMAGING, TESTING) - I reviewed patient records, labs, notes, testing and imaging myself where available.  Lab Results  Component Value Date   WBC  9.5 03/25/2022   HGB 15.0 03/25/2022   HCT 44.0 03/25/2022   MCV 87.0 03/25/2022   PLT 261 03/25/2022      Component Value Date/Time   NA 137 03/25/2022 2133   NA 138 01/09/2022 1524   K 3.4 (L) 03/25/2022 2133   CL 105 03/25/2022 2133   CO2 21 (L) 03/25/2022 2133   GLUCOSE 90 03/25/2022 2133   BUN 12 03/25/2022 2133   BUN 9 01/09/2022 1524   CREATININE 0.86 03/25/2022 2133    CREATININE 0.73 11/24/2021 1405   CALCIUM 8.9 03/25/2022 2133   PROT 6.9 03/25/2022 2133   ALBUMIN 3.8 03/25/2022 2133   AST 30 03/25/2022 2133   AST 24 11/24/2021 1405   ALT 30 03/25/2022 2133   ALT 29 11/24/2021 1405   ALKPHOS 60 03/25/2022 2133   BILITOT 0.7 03/25/2022 2133   BILITOT 0.4 11/24/2021 1405   GFRNONAA >60 03/25/2022 2133   GFRNONAA >60 11/24/2021 1405   GFRAA >60 04/30/2020 1400   No results found for: CHOL, HDL, LDLCALC, LDLDIRECT, TRIG, CHOLHDL No results found for: HGBA1C Lab Results  Component Value Date   VITAMINB12 236 10/29/2020   Lab Results  Component Value Date   TSH 2.77 05/30/2014       ASSESSMENT AND PLAN  Observed seizure-like activity ([place]) - Plan: MR BRAIN W WO CONTRAST, EEG adult  Gait disturbance - Plan: MR BRAIN W WO CONTRAST  Major depressive disorder with single episode, remission status unspecified  Type 2 diabetes mellitus with proteinuria ([place])   In summary, Ms. Wilner is a 51 year old woman who had the first seizure of her life 2 weeks ago.  It appears to have been a generalized tonic-clonic seizure.  She had several factors that may have reduced her seizure threshold.  She was on Wellbutrin 300 mg daily and also is on [place].  Furthermore she had a GI viral syndrome with nausea, vomiting and poor dietary intake of liquids and food which could lead to deranged electrolytes.  She continues to take her diabetes medication during that time which could lead to hypoglycemia.  When she got to the emergency room, there was no significant electrolyte or glucose abnormality.  Decreasing since the procedure and there were contributing factors, she does not need to be started on an antiepileptic agent.  However, if a second seizure occurred we will need to reconsider this.  I will check MRI of the brain and EEG.  If an epileptic focus or significant CNS abnormality is noted we may also want to add an additional antiepileptic agent (she is  already on lamotrigine for psychiatric issues).  I did speak to her about Wellbutrin lowering her seizure threshold.  She has done better on Wellbutrin and another medications and is reluctant to make a change.  I did advise her that if a second seizure occurs she will need to reconsider this.  We also discussed controllable risk factors such as poor sleep, p.o. intake and glucose control.  She is advised to try to get 7 hours of sleep.  If she does have another period of time with reduced p.o. intake she should check her fingerstick glucose and discuss with her primary care if she should take her diabetes medication.  We will let her know the results of the EEG and the MRI.  I did not schedule a follow-up visit but would be happy to see her back if we end up placing her on another antiepileptic agent or if another seizure  occurs.  She should call us if she has any significant new neurologic symptoms.  [place], sleep, latuda, dm, electrolytes>>??     Was on lamotrigine Eeg Mri If either abnl add medication (zonisamide?) prn   Macrae Wiegman A. Felecia Shelling, MD, [facility] 01/01/9517, 8:41 PM Certified in Neurology, Clinical Neurophysiology, Sleep Medicine and Neuroimaging  [facility] [address], [place], [place] [address] [phone]

## 2022-04-08 NOTE — Addendum Note (Signed)
Addended by: Arlice Colt A on: 04/08/2022 06:26 PM   Modules accepted: Level of Service

## 2022-04-10 ENCOUNTER — Telehealth: Payer: Self-pay | Admitting: Neurology

## 2022-04-10 NOTE — Telephone Encounter (Signed)
[employer] medicare sent to GI they obtain auth and call patient to schedule

## 2022-04-14 ENCOUNTER — Encounter: Payer: Self-pay | Admitting: Neurology

## 2022-04-14 ENCOUNTER — Ambulatory Visit (INDEPENDENT_AMBULATORY_CARE_PROVIDER_SITE_OTHER): Payer: Medicare (Managed Care) | Admitting: Neurology

## 2022-04-14 DIAGNOSIS — R569 Unspecified convulsions: Secondary | ICD-10-CM | POA: Diagnosis not present

## 2022-04-14 NOTE — Progress Notes (Signed)
   [facility]  EEG (ELECTROENCEPHALOGRAM) REPORT   STUDY DATE: 04/14/2022 PATIENT NAME: [name] DOB: 03-Aug-1971 MRN: [id number]  ORDERING CLINICIAN: Maxfield Gildersleeve A. Felecia Shelling, MD. PhD  TECHNOLOGIST: Wyman Songster TECHNIQUE: Electroencephalogram was recorded utilizing standard 10-20 system of lead placement and reformatted into average and bipolar montages.  RECORDING TIME: 23 minutes 33 seconds  CLINICAL INFORMATION: 51 year old woman with seizure-like activity  FINDINGS: A digital EEG was performed while the patient was awake and drowsy. While awake and most alert there was a 11 hz posterior dominant rhythm. Voltages and frequencies were symmetric.  There were no focal, lateralizing, epileptiform activity or seizures seen.  Photic stimulation had a normal driving response. Hyperventilation and recovery did not change the underlying rhythms. EKG channel shows normal sinus rhythm.  The patient did not fall asleep during the recording  IMPRESSION: This is a normal EEG while patient was awake.   INTERPRETING PHYSICIAN:   Merlen Gurry A. Felecia Shelling, MD, PhD, [facility] Certified in Neurology, Clinical Neurophysiology, Sleep Medicine, Pain Medicine and Neuroimaging  [facility] [address] [place], [place] [address] [phone]

## 2022-04-21 DIAGNOSIS — R269 Unspecified abnormalities of gait and mobility: Secondary | ICD-10-CM | POA: Diagnosis not present

## 2022-04-21 DIAGNOSIS — R569 Unspecified convulsions: Secondary | ICD-10-CM | POA: Diagnosis not present

## 2022-04-22 ENCOUNTER — Ambulatory Visit
Admission: RE | Admit: 2022-04-22 | Discharge: 2022-04-22 | Disposition: A | Discharge status: Home / Self Care | Payer: Medicare (Managed Care) | Source: Ambulatory Visit | Attending: Neurology | Admitting: Neurology

## 2022-04-22 DIAGNOSIS — R569 Unspecified convulsions: Secondary | ICD-10-CM

## 2022-04-22 DIAGNOSIS — R269 Unspecified abnormalities of gait and mobility: Secondary | ICD-10-CM

## 2022-04-22 MED ORDER — GADOBENATE DIMEGLUMINE 529 MG/ML IV SOLN
10.0000 mL | Freq: Once | INTRAVENOUS | Status: AC | PRN
  Administered 2022-04-22: 10 mL via INTRAVENOUS

## 2022-05-25 ENCOUNTER — Inpatient Hospital Stay: Payer: Medicare (Managed Care) | Admitting: Family

## 2022-05-25 ENCOUNTER — Inpatient Hospital Stay: Payer: Medicare (Managed Care)

## 2022-06-30 ENCOUNTER — Encounter: Payer: Self-pay | Admitting: Family

## 2022-06-30 ENCOUNTER — Other Ambulatory Visit (HOSPITAL_BASED_OUTPATIENT_CLINIC_OR_DEPARTMENT_OTHER): Payer: Self-pay

## 2022-06-30 MED ORDER — VYVANSE 60 MG PO CAPS
ORAL_CAPSULE | ORAL | 0 refills | Status: DC
  Filled 2022-06-30: qty 30, 30d supply, fill #0

## 2022-07-16 ENCOUNTER — Other Ambulatory Visit: Payer: Self-pay | Admitting: Obstetrics and Gynecology

## 2022-07-16 DIAGNOSIS — R928 Other abnormal and inconclusive findings on diagnostic imaging of breast: Secondary | ICD-10-CM

## 2022-07-24 ENCOUNTER — Ambulatory Visit
Admission: RE | Admit: 2022-07-24 | Discharge: 2022-07-24 | Disposition: A | Discharge status: Home / Self Care | Payer: Medicare (Managed Care) | Source: Ambulatory Visit | Attending: Obstetrics and Gynecology | Admitting: Obstetrics and Gynecology

## 2022-07-24 ENCOUNTER — Ambulatory Visit: Payer: Medicare (Managed Care)

## 2022-07-24 DIAGNOSIS — R928 Other abnormal and inconclusive findings on diagnostic imaging of breast: Secondary | ICD-10-CM

## 2022-07-30 ENCOUNTER — Other Ambulatory Visit (HOSPITAL_BASED_OUTPATIENT_CLINIC_OR_DEPARTMENT_OTHER): Payer: Self-pay

## 2022-07-30 MED ORDER — LISDEXAMFETAMINE DIMESYLATE 60 MG PO CAPS
60.0000 mg | ORAL_CAPSULE | Freq: Every morning | ORAL | 0 refills | Status: DC
  Filled 2022-07-30: qty 30, 30d supply, fill #0

## 2022-08-03 ENCOUNTER — Other Ambulatory Visit (HOSPITAL_BASED_OUTPATIENT_CLINIC_OR_DEPARTMENT_OTHER): Payer: Self-pay

## 2022-08-03 MED ORDER — LISDEXAMFETAMINE DIMESYLATE 70 MG PO CAPS
ORAL_CAPSULE | ORAL | 0 refills | Status: DC
  Filled 2022-08-03: qty 30, 30d supply, fill #0

## 2022-09-02 ENCOUNTER — Other Ambulatory Visit (HOSPITAL_BASED_OUTPATIENT_CLINIC_OR_DEPARTMENT_OTHER): Payer: Self-pay

## 2022-09-02 MED ORDER — LISDEXAMFETAMINE DIMESYLATE 70 MG PO CAPS
70.0000 mg | ORAL_CAPSULE | Freq: Every morning | ORAL | 0 refills | Status: DC
  Filled 2022-09-02: qty 30, 30d supply, fill #0

## 2022-09-03 ENCOUNTER — Other Ambulatory Visit (HOSPITAL_BASED_OUTPATIENT_CLINIC_OR_DEPARTMENT_OTHER): Payer: Self-pay

## 2022-09-29 ENCOUNTER — Other Ambulatory Visit (HOSPITAL_BASED_OUTPATIENT_CLINIC_OR_DEPARTMENT_OTHER): Payer: Self-pay

## 2022-09-29 MED ORDER — LISDEXAMFETAMINE DIMESYLATE 70 MG PO CAPS
70.0000 mg | ORAL_CAPSULE | Freq: Every morning | ORAL | 0 refills | Status: AC
  Filled 2022-10-01: qty 30, 30d supply, fill #0

## 2022-10-01 ENCOUNTER — Other Ambulatory Visit (HOSPITAL_BASED_OUTPATIENT_CLINIC_OR_DEPARTMENT_OTHER): Payer: Self-pay

## 2022-11-23 ENCOUNTER — Encounter: Payer: Self-pay | Admitting: Family

## 2022-11-30 ENCOUNTER — Other Ambulatory Visit: Payer: Self-pay | Admitting: Cardiovascular Disease

## 2022-12-02 ENCOUNTER — Encounter: Payer: Self-pay | Admitting: Family

## 2022-12-08 ENCOUNTER — Telehealth: Payer: Self-pay | Admitting: *Deleted

## 2022-12-08 NOTE — Telephone Encounter (Signed)
Patient called to cancel - said that she would call back to reschedule appointment with Dr. Marin Olp.

## 2022-12-10 ENCOUNTER — Inpatient Hospital Stay: Payer: Medicare HMO | Admitting: Hematology & Oncology

## 2022-12-10 ENCOUNTER — Inpatient Hospital Stay: Payer: Medicare HMO

## 2023-02-25 ENCOUNTER — Ambulatory Visit: Payer: Medicare HMO | Admitting: Hematology & Oncology

## 2023-02-25 ENCOUNTER — Other Ambulatory Visit: Payer: Medicare HMO

## 2023-03-01 ENCOUNTER — Telehealth: Payer: Self-pay | Admitting: Cardiovascular Disease

## 2023-03-01 ENCOUNTER — Ambulatory Visit: Payer: Medicare HMO | Attending: Cardiovascular Disease | Admitting: Cardiovascular Disease

## 2023-03-01 ENCOUNTER — Encounter: Payer: Self-pay | Admitting: Cardiovascular Disease

## 2023-03-01 VITALS — BP 112/80 | HR 80 | Ht 66.0 in | Wt 233.2 lb

## 2023-03-01 DIAGNOSIS — I7300 Raynaud's syndrome without gangrene: Secondary | ICD-10-CM

## 2023-03-01 DIAGNOSIS — Z7985 Long-term (current) use of injectable non-insulin antidiabetic drugs: Secondary | ICD-10-CM

## 2023-03-01 DIAGNOSIS — I10 Essential (primary) hypertension: Secondary | ICD-10-CM

## 2023-03-01 DIAGNOSIS — E7800 Pure hypercholesterolemia, unspecified: Secondary | ICD-10-CM | POA: Diagnosis not present

## 2023-03-01 DIAGNOSIS — I77810 Thoracic aortic ectasia: Secondary | ICD-10-CM

## 2023-03-01 DIAGNOSIS — E1169 Type 2 diabetes mellitus with other specified complication: Secondary | ICD-10-CM

## 2023-03-01 DIAGNOSIS — R9431 Abnormal electrocardiogram [ECG] [EKG]: Secondary | ICD-10-CM

## 2023-03-01 DIAGNOSIS — I288 Other diseases of pulmonary vessels: Secondary | ICD-10-CM

## 2023-03-01 DIAGNOSIS — E669 Obesity, unspecified: Secondary | ICD-10-CM

## 2023-03-01 NOTE — Patient Instructions (Signed)
Medication Instructions:  No changes *If you need a refill on your cardiac medications before your next appointment, please call your pharmacy*  Follow-Up: At [facility], you and your health needs are our priority.  As part of our continuing mission to provide you with exceptional heart care, we have created designated Provider Care Teams.  These Care Teams include your primary Cardiologist (physician) and Advanced Practice Providers (APPs -  Physician Assistants and Nurse Practitioners) who all work together to provide you with the care you need, when you need it.  We recommend signing up for the patient portal called "MyChart".  Sign up information is provided on this After Visit Summary.  MyChart is used to connect with patients for Virtual Visits (Telemedicine).  Patients are able to view lab/test results, encounter notes, upcoming appointments, etc.  Non-urgent messages can be sent to your provider as well.   To learn more about what you can do with MyChart, go to [email or web address].    Your next appointment:   1 year(s)  Provider:   Thurmon Fair, MD     Other Instructions Call or message [place] when you are ready to get the CTA scheduled

## 2023-03-01 NOTE — Telephone Encounter (Signed)
Patient has an appt today and was supposed to get an EKG, but her insurance is charging a copay for the EKG of $75. Patient would like to know if she can still come in with out having to get an EKG done to be able to schedule her annual CAT Scan. Please advise

## 2023-03-01 NOTE — Telephone Encounter (Signed)
Returned call to patient who states that she does not want an EKG today at visit. Patient reports that she talked to her insurance and it is a 75$ copay and she states that she can not afford that at this time. Will forward to MD to make aware patient does not want EKG today.

## 2023-03-02 ENCOUNTER — Other Ambulatory Visit: Payer: Self-pay | Admitting: Cardiovascular Disease

## 2023-03-08 DIAGNOSIS — I288 Other diseases of pulmonary vessels: Secondary | ICD-10-CM | POA: Insufficient documentation

## 2023-03-08 DIAGNOSIS — I77810 Thoracic aortic ectasia: Secondary | ICD-10-CM | POA: Insufficient documentation

## 2023-03-08 NOTE — Progress Notes (Signed)
Cardiology Office Note:    Date:  03/08/2023   ID:  [name], DOB 1971-08-08, MRN [id number]  PCP:  Redmond School, NP   [facility] Providers Cardiologist:  Thurmon Fair, MD     Referring MD: Redmond School, NP   No chief complaint on file.  History of Present Illness:    [name] is a 52 y.o. female with a hx of hypertension, type 2 diabetes mellitus, hypercholesterolemia, PCOS, severe obesity, Sjogren's syndrome and anemia who was initially referred to cardiology after she was found to have an abnormal ECG during evaluation for chest pain.    She underwent a coronary CT angiogram that showed no evidence of coronary stenoses and had a calcium score of 0.  The ascending aorta was borderline dilated at 39 mm and the main pulmonary artery was also mildly dilated at 31 mm.  Her echocardiogram was normal.  Although a pattern of "impaired relaxation" was described, this was likely due to relative hypovolemia rather than true diastolic dysfunction.  There was no evidence of LVH or left atrial enlargement and mitral annulus E prime velocities were 7 and 10 cm/s respectively with the E/e' ratio of 7-9.  She has not had recurrent problems with chest pain.  She walks 30 minutes a day.  She denies shortness of breath with activity.  He does not have orthopnea, PND, lower extremity edema, palpitations or syncope.  She remains severely obese with a BMI of 37.  Glycemic control is reportedly good.  She develops typical purple and white discoloration of her fingertips when taking a cold drink out of the refrigerator and has chronic cyanosis of her feet due to Raynaud's syndrome.  She is not receiving a calcium channel blocker at this time.  She requested not to have an ECG today, since this is a very high co-pay on her current insurance.  For similar reasons, she would like to delay any follow-up on the mildly dilated ascending aorta diagnosed currently recent CT.  At her last appointment her  ECG shows normal sinus rhythm with diffuse nonspecific T wave changes.  The QS pattern in the anteroseptal leads is no longer present, suggesting that on the previous ECG the ECG leads may have been placed high.  Questionable Q waves are seen in the inferior leads on the current tracing.  Due to the diffusely flattened T waves and some baseline tremor artifact it is very hard to measure her QT interval accurately.  On statin therapy, her most recent lipid profile shows improvement from 2021.  Her total cholesterol 181, LDL 107, HDL 51, triglycerides 144.  Hemoglobin A1c was 6.9% in 2021 and office notes report a more recent value of 6.1%.  Her only current medication for diabetes mellitus is Trulicity.  She has been slowly losing weight since taking this medication.  She has normal renal function but has persistent proteinuria and is under the care of a nephrologist.   Past Medical History:  Diagnosis Date   Allergy    Anemia    Anxiety    Depression    Diabetes (HCC)    Elevated LFTs    Fibromyalgia    Hypercholesteremia    Hypertension    Hypomagnesemia    Morbid obesity (HCC)    Seizures (HCC)    Sjoegren syndrome     Past Surgical History:  Procedure Laterality Date   COLON SURGERY     ECT TREATMENT     ESSURE TUBAL LIGATION  TONSILLECTOMY     UPPER GASTROINTESTINAL ENDOSCOPY     WISDOM TOOTH EXTRACTION      Current Medications: Current Meds  Medication Sig   ALPRAZolam (XANAX) 1 MG tablet Take 1 mg by mouth 4 (four) times daily as needed for anxiety.   aspirin EC 81 MG tablet Take 81 mg by mouth daily. Swallow whole.   atorvastatin (LIPITOR) 40 MG tablet Take 40 mg by mouth daily.   buPROPion (WELLBUTRIN XL) 300 MG 24 hr tablet Take 300 mg by mouth daily.   cevimeline (EVOXAC) 30 MG capsule Take 30 mg by mouth daily as needed (dry mouth).   colchicine 0.6 MG tablet Take 0.6 mg by mouth daily.   DULoxetine (CYMBALTA) 60 MG capsule Take 60 mg daily by mouth.   glucose  blood test strip Contour Next Test Strips  USE 1 STRIP TO CHECK GLUCOSE ONCE DAILY   hydroxychloroquine (PLAQUENIL) 200 MG tablet Take 400 mg by mouth daily.   lamoTRIgine (LAMICTAL) 200 MG tablet Take 400 mg by mouth daily.   linaclotide (LINZESS) 290 MCG CAPS capsule Take by mouth.   lisdexamfetamine (VYVANSE) 70 MG capsule Take 1 capsule (70 mg total) by mouth every morning.   losartan (COZAAR) 100 MG tablet Take 100 mg by mouth daily.   Lurasidone HCl (LATUDA) 60 MG TABS Take 60 mg by mouth daily.   Microlet Lancets MISC Microlet Lancet  USE 1 TO CHECK GLUCOSE ONCE DAILY   omeprazole (PRILOSEC) 40 MG capsule Take 1 capsule (40 mg total) by mouth daily.   TRULICITY 1.5 MG/0.5ML SOPN Inject 1.5 mg into the skin every Wednesday.   Vitamin D, Ergocalciferol, (DRISDOL) 1.25 MG (50000 UNIT) CAPS capsule Take 1 capsule by mouth once a week.   [DISCONTINUED] amLODipine (NORVASC) 2.5 MG tablet Take 1 tablet (2.5 mg total) by mouth daily. PATIENT MUST ATTEND UPCOMING APPOINTMENT FOR FUTURE REFILLS     Allergies:   Penicillins, Sulfa antibiotics, and Sulfonamide derivatives   Social History   Socioeconomic History   Marital status: Married    Spouse name: Not on file   Number of children: 2   Years of education: Not on file   Highest education level: Not on file  Occupational History   Occupation: disabled  Tobacco Use   Smoking status: Former    Types: Cigarettes    Quit date: 1994    Years since quitting: 30.3   Smokeless tobacco: Never  Vaping Use   Vaping Use: Never used  Substance and Sexual Activity   Alcohol use: No    Alcohol/week: 0.0 standard drinks of alcohol   Drug use: No   Sexual activity: Not on file  Other Topics Concern   Not on file  Social History Narrative   Lives at home w husband   R handed   Caffeine: 1 C coffee a day.    Social Determinants of Health   Financial Resource Strain: Not on file  Food Insecurity: Not on file  Transportation Needs: Not  on file  Physical Activity: Not on file  Stress: Not on file  Social Connections: Not on file     Family History: The patient's family history includes Breast cancer in her paternal aunt and paternal aunt; Diabetes in her father and mother; Lupus in her paternal aunt; Multiple sclerosis in her mother; Prostate cancer in her father; Psoriasis in her father; Rheum arthritis in her paternal aunt. There is no history of Colon cancer, Esophageal cancer, Rectal cancer, or Stomach cancer.  ROS:   Please see the history of present illness.     All other systems reviewed and are negative.  EKGs/Labs/Other Studies Reviewed:    The following studies were reviewed today: Coronary CT angiogram 01/30/2022:  1. No evidence of CAD, CADRADS = 0.   2. Coronary calcium score of 0.   3. Normal coronary origins with left dominance.   4.  Borderline dilation of mid ascending aorta, 39 mm.   5. Mild dilation of main pulmonary artery 31 mm, which may suggest increased pulmonary pressure.  Echocardiogram 01/16/2022:   1. Left ventricular ejection fraction, by estimation, is 60 to 65%. The  left ventricle has normal function. The left ventricle has no regional  wall motion abnormalities. Left ventricular diastolic parameters are  consistent with Grade I diastolic  dysfunction (impaired relaxation).   2. Right ventricular systolic function is normal. The right ventricular  size is normal. There is normal pulmonary artery systolic pressure. The  estimated right ventricular systolic pressure is 17.1 mmHg.   3. The mitral valve is normal in structure. Trivial mitral valve  regurgitation. No evidence of mitral stenosis.   4. The aortic valve is normal in structure. Aortic valve regurgitation is  not visualized. No aortic stenosis is present.   5. There is mild dilatation of the ascending aorta, measuring 41 mm.   6. The inferior vena cava is normal in size with greater than 50%  respiratory  variability, suggesting right atrial pressure of 3 mmHg.   EKG:  EKG is ordered today.  The ekg ordered today demonstrates normal sinus rhythm, questionable Q waves in leads III and aVF, but not in lead II, diffuse nonspecific T wave flattening, hard to measure the QT accurately.  Recent Labs: 03/25/2022: ALT 30; BUN 12; Creatinine, Ser 0.86; Hemoglobin 15.0; Magnesium 1.9; Platelets 261; Potassium 3.4; Sodium 137  Recent Lipid Panel No results found for: "CHOL", "TRIG", "HDL", "CHOLHDL", "VLDL", "LDLCALC", "LDLDIRECT" 09/18/2021 Cholesterol 181, HDL 51, LDL 107, triglycerides 144  Risk Assessment/Calculations:      STOP-Bang Score:  3       Physical Exam:    VS:  BP 112/80 (BP Location: Left Arm, Patient Position: Sitting, Cuff Size: Large)   Pulse 80   Ht 5\' 6"  (1.676 m)   Wt 233 lb 3.2 oz (105.8 kg)   SpO2 97%   BMI 37.64 kg/m     Wt Readings from Last 3 Encounters:  03/01/23 233 lb 3.2 oz (105.8 kg)  04/08/22 239 lb 8 oz (108.6 kg)  03/25/22 230 lb (104.3 kg)     General: Alert, oriented x3, no distress, severely obese Head: no evidence of trauma, PERRL, EOMI, no exophtalmos or lid lag, no myxedema, no xanthelasma; normal ears, nose and oropharynx Neck: normal jugular venous pulsations and no hepatojugular reflux; brisk carotid pulses without delay and no carotid bruits Chest: clear to auscultation, no signs of consolidation by percussion or palpation, normal fremitus, symmetrical and full respiratory excursions Cardiovascular: normal position and quality of the apical impulse, regular rhythm, normal first and second heart sounds, no murmurs, rubs or gallops Abdomen: no tenderness or distention, no masses by palpation, no abnormal pulsatility or arterial bruits, normal bowel sounds, no hepatosplenomegaly Extremities: no clubbing, cyanosis or edema; 2+ radial, ulnar and brachial pulses bilaterally; 2+ right femoral, posterior tibial and dorsalis pedis pulses; 2+ left femoral,  posterior tibial and dorsalis pedis pulses; no subclavian or femoral bruits Neurological: grossly nonfocal Psych: Normal mood and affect   ASSESSMENT:  1. Nonspecific abnormal electrocardiogram (ECG) (EKG)   2. Hypercholesterolemia   3. Essential hypertension   4. Raynaud's disease without gangrene   5. Type 2 diabetes mellitus with obesity (HCC)   6. Ascending aorta dilatation (HCC)   7. Dilation of pulmonary artery (HCC)    PLAN:    In order of problems listed above:   Abnormal ECG: The abnormalities described were likely due to lead placement.  We did not repeat an ECG today at her request.  The echo does not show any regional wall motion abnormalities.  She does not have any coronary stenoses on CT angiography. Hypercholesterolemia: Target LDL less than 100 since she has diabetes mellitus.  She does not have evidence of CAD or PAD at this time.  Continue atorvastatin HTN: Well-controlled.  Continue losartan Raynaud's syndrome: Started low-dose amlodipine.  Hard to tell whether it is helping since the weather has been warmer since she is taking that medication.  On hydroxychloroquine for her autoimmune disorder. DM: her BMI used to be greater than 40, so it appears that the Trulicity has truly helped her lose weight.  Unfortunately moving slower, has only lost 6 pounds since last June.  Continue weight loss efforts. Asc Ao dilation: She will call us back after she changes insurance schedule a repeat CT angiogram of the chest. Dilated pulmonary artery: This could be due to pulmonary hypertension.  On the echo there was no evidence of right atrial or right ventricular enlargement and right ventricular function was normal.  The PA pressure was estimated at 17 mmHg, but the TR jet was poor and this may be an underestimation.  Discussed the signs and symptoms of sleep apnea.  She does not have daytime hypersomnolence.  Has not had witnessed apnea.  STOP-BANG score is only 3.  Has a history  of collagen vascular disease, but no history of other illnesses typically associated with PAH.  She does not have shortness of breath.  At this point invasive evaluation to confirm pulm hypertension does not appear to be justified.           Medication Adjustments/Labs and Tests Ordered: Current medicines are reviewed at length with the patient today.  Concerns regarding medicines are outlined above.  No orders of the defined types were placed in this encounter.  No orders of the defined types were placed in this encounter.   Patient Instructions  Medication Instructions:  No changes *If you need a refill on your cardiac medications before your next appointment, please call your pharmacy*  Follow-Up: At [facility], you and your health needs are our priority.  As part of our continuing mission to provide you with exceptional heart care, we have created designated Provider Care Teams.  These Care Teams include your primary Cardiologist (physician) and Advanced Practice Providers (APPs -  Physician Assistants and Nurse Practitioners) who all work together to provide you with the care you need, when you need it.  We recommend signing up for the patient portal called "MyChart".  Sign up information is provided on this After Visit Summary.  MyChart is used to connect with patients for Virtual Visits (Telemedicine).  Patients are able to view lab/test results, encounter notes, upcoming appointments, etc.  Non-urgent messages can be sent to your provider as well.   To learn more about what you can do with MyChart, go to [email or web address].    Your next appointment:   1 year(s)  Provider:   Thurmon Fair, MD  Other Instructions Call or message [place] when you are ready to get the CTA scheduled    Signed, Thurmon Fair, MD  03/08/2023 1:23 PM    [facility]

## 2023-03-12 ENCOUNTER — Encounter: Payer: Self-pay | Admitting: Neurology

## 2023-04-30 ENCOUNTER — Other Ambulatory Visit: Payer: Self-pay

## 2023-04-30 ENCOUNTER — Observation Stay (HOSPITAL_BASED_OUTPATIENT_CLINIC_OR_DEPARTMENT_OTHER)
Admission: EM | Admit: 2023-04-30 | Discharge: 2023-05-02 | Disposition: A | Discharge status: Home / Self Care | Payer: Medicare HMO | Attending: Internal Medicine | Admitting: Internal Medicine

## 2023-04-30 ENCOUNTER — Encounter (HOSPITAL_BASED_OUTPATIENT_CLINIC_OR_DEPARTMENT_OTHER): Payer: Self-pay

## 2023-04-30 DIAGNOSIS — R195 Other fecal abnormalities: Secondary | ICD-10-CM | POA: Diagnosis not present

## 2023-04-30 DIAGNOSIS — E119 Type 2 diabetes mellitus without complications: Secondary | ICD-10-CM | POA: Insufficient documentation

## 2023-04-30 DIAGNOSIS — K921 Melena: Secondary | ICD-10-CM | POA: Diagnosis not present

## 2023-04-30 DIAGNOSIS — Z7982 Long term (current) use of aspirin: Secondary | ICD-10-CM | POA: Insufficient documentation

## 2023-04-30 DIAGNOSIS — Z87891 Personal history of nicotine dependence: Secondary | ICD-10-CM | POA: Insufficient documentation

## 2023-04-30 DIAGNOSIS — K645 Perianal venous thrombosis: Secondary | ICD-10-CM

## 2023-04-30 DIAGNOSIS — Z7985 Long-term (current) use of injectable non-insulin antidiabetic drugs: Secondary | ICD-10-CM | POA: Insufficient documentation

## 2023-04-30 DIAGNOSIS — E7800 Pure hypercholesterolemia, unspecified: Secondary | ICD-10-CM | POA: Diagnosis not present

## 2023-04-30 DIAGNOSIS — G4733 Obstructive sleep apnea (adult) (pediatric): Secondary | ICD-10-CM | POA: Diagnosis present

## 2023-04-30 DIAGNOSIS — K5909 Other constipation: Secondary | ICD-10-CM

## 2023-04-30 DIAGNOSIS — K317 Polyp of stomach and duodenum: Secondary | ICD-10-CM | POA: Diagnosis not present

## 2023-04-30 DIAGNOSIS — K6289 Other specified diseases of anus and rectum: Secondary | ICD-10-CM | POA: Diagnosis present

## 2023-04-30 DIAGNOSIS — M3500 Sicca syndrome, unspecified: Secondary | ICD-10-CM | POA: Diagnosis present

## 2023-04-30 DIAGNOSIS — I10 Essential (primary) hypertension: Secondary | ICD-10-CM | POA: Diagnosis present

## 2023-04-30 DIAGNOSIS — Z79899 Other long term (current) drug therapy: Secondary | ICD-10-CM | POA: Diagnosis not present

## 2023-04-30 DIAGNOSIS — E1129 Type 2 diabetes mellitus with other diabetic kidney complication: Secondary | ICD-10-CM | POA: Diagnosis present

## 2023-04-30 LAB — OCCULT BLOOD X 1 CARD TO LAB, STOOL: Fecal Occult Bld: POSITIVE — AB

## 2023-04-30 LAB — CBC WITH DIFFERENTIAL/PLATELET
Abs Immature Granulocytes: 0.04 10*3/uL (ref 0.00–0.07)
Basophils Absolute: 0.1 10*3/uL (ref 0.0–0.1)
Basophils Relative: 0 %
Eosinophils Absolute: 0.2 10*3/uL (ref 0.0–0.5)
Eosinophils Relative: 2 %
HCT: 42.3 % (ref 36.0–46.0)
Hemoglobin: 14.3 g/dL (ref 12.0–15.0)
Immature Granulocytes: 0 %
Lymphocytes Relative: 22 %
Lymphs Abs: 2.9 10*3/uL (ref 0.7–4.0)
MCH: 29.4 pg (ref 26.0–34.0)
MCHC: 33.8 g/dL (ref 30.0–36.0)
MCV: 87 fL (ref 80.0–100.0)
Monocytes Absolute: 0.9 10*3/uL (ref 0.1–1.0)
Monocytes Relative: 7 %
Neutro Abs: 8.6 10*3/uL — ABNORMAL HIGH (ref 1.7–7.7)
Neutrophils Relative %: 69 %
Platelets: 273 10*3/uL (ref 150–400)
RBC: 4.86 MIL/uL (ref 3.87–5.11)
RDW: 12.9 % (ref 11.5–15.5)
WBC: 12.7 10*3/uL — ABNORMAL HIGH (ref 4.0–10.5)
nRBC: 0 % (ref 0.0–0.2)

## 2023-04-30 LAB — COMPREHENSIVE METABOLIC PANEL
ALT: 23 U/L (ref 0–44)
AST: 27 U/L (ref 15–41)
Albumin: 4.2 g/dL (ref 3.5–5.0)
Alkaline Phosphatase: 77 U/L (ref 38–126)
Anion gap: 9 (ref 5–15)
BUN: 11 mg/dL (ref 6–20)
CO2: 21 mmol/L — ABNORMAL LOW (ref 22–32)
Calcium: 8.9 mg/dL (ref 8.9–10.3)
Chloride: 104 mmol/L (ref 98–111)
Creatinine, Ser: 0.76 mg/dL (ref 0.44–1.00)
GFR, Estimated: >60 mL/min (ref >60)
Glucose, Bld: 123 mg/dL — ABNORMAL HIGH (ref 70–99)
Potassium: 4 mmol/L (ref 3.5–5.1)
Sodium: 134 mmol/L — ABNORMAL LOW (ref 135–145)
Total Bilirubin: 0.7 mg/dL (ref 0.3–1.2)
Total Protein: 7.7 g/dL (ref 6.5–8.1)

## 2023-04-30 MED ORDER — OXYCODONE-ACETAMINOPHEN 5-325 MG PO TABS
1.0000 | ORAL_TABLET | ORAL | Status: DC | PRN
  Administered 2023-04-30: 1 via ORAL
  Filled 2023-04-30: qty 1

## 2023-04-30 MED ORDER — LIDOCAINE HCL URETHRAL/MUCOSAL 2 % EX GEL
1.0000 | Freq: Once | CUTANEOUS | Status: AC
  Administered 2023-04-30: 1 via TOPICAL
  Filled 2023-04-30: qty 11

## 2023-04-30 MED ORDER — FENTANYL CITRATE PF 50 MCG/ML IJ SOSY
50.0000 ug | PREFILLED_SYRINGE | Freq: Once | INTRAMUSCULAR | Status: AC
  Administered 2023-04-30: 50 ug via INTRAVENOUS
  Filled 2023-04-30: qty 1

## 2023-04-30 NOTE — ED Triage Notes (Signed)
Pt reports having dark tarry stool this morning and having severe sharp pain in rectum since. No BM throughout day. No blood reported in underwear. Pt also reports mild pain in lower abdomen. Denies N/V, CP, SOB.

## 2023-04-30 NOTE — ED Notes (Signed)
Patient in pain, shaking in the bed. Complains of severe interior rectal pain.

## 2023-05-01 ENCOUNTER — Emergency Department (HOSPITAL_BASED_OUTPATIENT_CLINIC_OR_DEPARTMENT_OTHER): Payer: Medicare HMO

## 2023-05-01 ENCOUNTER — Encounter (HOSPITAL_BASED_OUTPATIENT_CLINIC_OR_DEPARTMENT_OTHER): Payer: Self-pay

## 2023-05-01 ENCOUNTER — Observation Stay (HOSPITAL_BASED_OUTPATIENT_CLINIC_OR_DEPARTMENT_OTHER): Payer: Medicare HMO | Admitting: Anesthesiology

## 2023-05-01 ENCOUNTER — Observation Stay (HOSPITAL_COMMUNITY): Payer: Medicare HMO | Admitting: Anesthesiology

## 2023-05-01 ENCOUNTER — Encounter (HOSPITAL_COMMUNITY)
Admission: EM | Disposition: A | Discharge status: Home / Self Care | Payer: Self-pay | Source: Home / Self Care | Attending: Emergency Medicine

## 2023-05-01 DIAGNOSIS — Z87891 Personal history of nicotine dependence: Secondary | ICD-10-CM | POA: Diagnosis not present

## 2023-05-01 DIAGNOSIS — G4733 Obstructive sleep apnea (adult) (pediatric): Secondary | ICD-10-CM

## 2023-05-01 DIAGNOSIS — R195 Other fecal abnormalities: Secondary | ICD-10-CM

## 2023-05-01 DIAGNOSIS — E119 Type 2 diabetes mellitus without complications: Secondary | ICD-10-CM

## 2023-05-01 DIAGNOSIS — Z7985 Long-term (current) use of injectable non-insulin antidiabetic drugs: Secondary | ICD-10-CM | POA: Diagnosis not present

## 2023-05-01 DIAGNOSIS — I10 Essential (primary) hypertension: Secondary | ICD-10-CM

## 2023-05-01 DIAGNOSIS — K921 Melena: Secondary | ICD-10-CM

## 2023-05-01 DIAGNOSIS — K645 Perianal venous thrombosis: Secondary | ICD-10-CM

## 2023-05-01 DIAGNOSIS — K6289 Other specified diseases of anus and rectum: Secondary | ICD-10-CM

## 2023-05-01 DIAGNOSIS — K317 Polyp of stomach and duodenum: Secondary | ICD-10-CM

## 2023-05-01 DIAGNOSIS — Z7982 Long term (current) use of aspirin: Secondary | ICD-10-CM | POA: Diagnosis not present

## 2023-05-01 DIAGNOSIS — E1129 Type 2 diabetes mellitus with other diabetic kidney complication: Secondary | ICD-10-CM | POA: Diagnosis not present

## 2023-05-01 DIAGNOSIS — M3500 Sicca syndrome, unspecified: Secondary | ICD-10-CM | POA: Diagnosis not present

## 2023-05-01 DIAGNOSIS — K5909 Other constipation: Secondary | ICD-10-CM | POA: Diagnosis not present

## 2023-05-01 DIAGNOSIS — E7800 Pure hypercholesterolemia, unspecified: Secondary | ICD-10-CM | POA: Diagnosis not present

## 2023-05-01 DIAGNOSIS — R809 Proteinuria, unspecified: Secondary | ICD-10-CM

## 2023-05-01 DIAGNOSIS — Z79899 Other long term (current) drug therapy: Secondary | ICD-10-CM | POA: Diagnosis not present

## 2023-05-01 HISTORY — PX: ESOPHAGOGASTRODUODENOSCOPY (EGD) WITH PROPOFOL: SHX5813

## 2023-05-01 LAB — PREGNANCY, URINE: Preg Test, Ur: NEGATIVE

## 2023-05-01 LAB — HEMOGLOBIN AND HEMATOCRIT, BLOOD
HCT: 37.7 % (ref 36.0–46.0)
HCT: 37.9 % (ref 36.0–46.0)
HCT: 37 % (ref 36.0–46.0)
Hemoglobin: 12.6 g/dL (ref 12.0–15.0)
Hemoglobin: 12.5 g/dL (ref 12.0–15.0)
Hemoglobin: 11.9 g/dL — ABNORMAL LOW (ref 12.0–15.0)

## 2023-05-01 LAB — COMPREHENSIVE METABOLIC PANEL
ALT: 22 U/L (ref 0–44)
AST: 21 U/L (ref 15–41)
Albumin: 3.3 g/dL — ABNORMAL LOW (ref 3.5–5.0)
Alkaline Phosphatase: 58 U/L (ref 38–126)
Anion gap: 8 (ref 5–15)
BUN: 8 mg/dL (ref 6–20)
CO2: 23 mmol/L (ref 22–32)
Calcium: 8.6 mg/dL — ABNORMAL LOW (ref 8.9–10.3)
Chloride: 105 mmol/L (ref 98–111)
Creatinine, Ser: 0.8 mg/dL (ref 0.44–1.00)
GFR, Estimated: >60 mL/min (ref >60)
Glucose, Bld: 88 mg/dL (ref 70–99)
Potassium: 3.3 mmol/L — ABNORMAL LOW (ref 3.5–5.1)
Sodium: 136 mmol/L (ref 135–145)
Total Bilirubin: 0.7 mg/dL (ref 0.3–1.2)
Total Protein: 6.1 g/dL — ABNORMAL LOW (ref 6.5–8.1)

## 2023-05-01 LAB — PROTIME-INR
INR: 1 (ref 0.8–1.2)
Prothrombin Time: 13.7 seconds (ref 11.4–15.2)

## 2023-05-01 LAB — GLUCOSE, CAPILLARY
Glucose-Capillary: 84 mg/dL (ref 70–99)
Glucose-Capillary: 84 mg/dL (ref 70–99)
Glucose-Capillary: 71 mg/dL (ref 70–99)
Glucose-Capillary: 82 mg/dL (ref 70–99)
Glucose-Capillary: 90 mg/dL (ref 70–99)

## 2023-05-01 LAB — HIV ANTIBODY (ROUTINE TESTING W REFLEX): HIV Screen 4th Generation wRfx: NONREACTIVE

## 2023-05-01 SURGERY — ESOPHAGOGASTRODUODENOSCOPY (EGD) WITH PROPOFOL
Anesthesia: Monitor Anesthesia Care

## 2023-05-01 MED ORDER — LISDEXAMFETAMINE DIMESYLATE 70 MG PO CAPS
70.0000 mg | ORAL_CAPSULE | Freq: Every morning | ORAL | Status: DC
  Administered 2023-05-01 – 2023-05-02 (×2): 70 mg via ORAL
  Filled 2023-05-01 (×2): qty 1

## 2023-05-01 MED ORDER — PANTOPRAZOLE INFUSION (NEW) - SIMPLE MED
8.0000 mg/h | INTRAVENOUS | Status: DC
  Administered 2023-05-01: 8 mg/h via INTRAVENOUS
  Filled 2023-05-01 (×2): qty 100

## 2023-05-01 MED ORDER — BUPROPION HCL ER (XL) 150 MG PO TB24
300.0000 mg | ORAL_TABLET | Freq: Every day | ORAL | Status: DC
  Administered 2023-05-01 – 2023-05-02 (×2): 300 mg via ORAL
  Filled 2023-05-01 (×2): qty 2

## 2023-05-01 MED ORDER — PANTOPRAZOLE SODIUM 40 MG IV SOLR
INTRAVENOUS | Status: AC
  Filled 2023-05-01: qty 20

## 2023-05-01 MED ORDER — LIDOCAINE 2% (20 MG/ML) 5 ML SYRINGE
INTRAMUSCULAR | Status: DC | PRN
  Administered 2023-05-01: 60 mg via INTRAVENOUS

## 2023-05-01 MED ORDER — CEVIMELINE HCL 30 MG PO CAPS: 30.0000 mg | ORAL_CAPSULE | Freq: Every day | ORAL | Status: DC | PRN

## 2023-05-01 MED ORDER — LINACLOTIDE 72 MCG PO CAPS
72.0000 ug | ORAL_CAPSULE | Freq: Every day | ORAL | Status: DC
  Administered 2023-05-01: 72 ug via ORAL
  Filled 2023-05-01: qty 1

## 2023-05-01 MED ORDER — MORPHINE SULFATE (PF) 2 MG/ML IV SOLN
2.0000 mg | INTRAVENOUS | Status: DC | PRN
  Administered 2023-05-01 (×3): 2 mg via INTRAVENOUS
  Filled 2023-05-01 (×3): qty 1

## 2023-05-01 MED ORDER — NITROGLYCERIN 2 % TD OINT
0.5000 [in_us] | TOPICAL_OINTMENT | Freq: Three times a day (TID) | TRANSDERMAL | Status: AC
  Administered 2023-05-01: 0.5 [in_us] via TOPICAL
  Filled 2023-05-01: qty 30

## 2023-05-01 MED ORDER — HYDROXYCHLOROQUINE SULFATE 200 MG PO TABS
400.0000 mg | ORAL_TABLET | Freq: Every day | ORAL | Status: DC
  Administered 2023-05-01 – 2023-05-02 (×2): 400 mg via ORAL
  Filled 2023-05-01 (×2): qty 2

## 2023-05-01 MED ORDER — SODIUM CHLORIDE 0.9 % IV SOLN: INTRAVENOUS | Status: DC

## 2023-05-01 MED ORDER — LIDOCAINE 4 % EX CREA: TOPICAL_CREAM | Freq: Four times a day (QID) | CUTANEOUS | Status: DC | PRN

## 2023-05-01 MED ORDER — NITROGLYCERIN 2 % TD OINT
0.5000 [in_us] | TOPICAL_OINTMENT | Freq: Once | TRANSDERMAL | Status: AC
  Administered 2023-05-01: 0.5 [in_us] via TOPICAL
  Filled 2023-05-01: qty 1

## 2023-05-01 MED ORDER — DOCUSATE SODIUM 100 MG PO CAPS
100.0000 mg | ORAL_CAPSULE | Freq: Two times a day (BID) | ORAL | Status: DC
  Administered 2023-05-01 – 2023-05-02 (×2): 100 mg via ORAL
  Filled 2023-05-01 (×3): qty 1

## 2023-05-01 MED ORDER — ONDANSETRON HCL 4 MG PO TABS: 4.0000 mg | ORAL_TABLET | Freq: Four times a day (QID) | ORAL | Status: DC | PRN

## 2023-05-01 MED ORDER — AMLODIPINE BESYLATE 2.5 MG PO TABS
2.5000 mg | ORAL_TABLET | Freq: Every day | ORAL | Status: DC
  Administered 2023-05-02: 2.5 mg via ORAL
  Filled 2023-05-01 (×2): qty 1

## 2023-05-01 MED ORDER — POTASSIUM CHLORIDE 10 MEQ/100ML IV SOLN
10.0000 meq | INTRAVENOUS | Status: AC
  Administered 2023-05-01 (×2): 10 meq via INTRAVENOUS
  Filled 2023-05-01 (×3): qty 100

## 2023-05-01 MED ORDER — PANTOPRAZOLE SODIUM 40 MG PO TBEC
40.0000 mg | DELAYED_RELEASE_TABLET | Freq: Two times a day (BID) | ORAL | Status: DC
  Administered 2023-05-01 – 2023-05-02 (×2): 40 mg via ORAL
  Filled 2023-05-01 (×2): qty 1

## 2023-05-01 MED ORDER — LAMOTRIGINE 100 MG PO TABS
400.0000 mg | ORAL_TABLET | Freq: Every day | ORAL | Status: DC
  Administered 2023-05-01 – 2023-05-02 (×2): 400 mg via ORAL
  Filled 2023-05-01 (×2): qty 4

## 2023-05-01 MED ORDER — POLYETHYLENE GLYCOL 3350 17 G PO PACK
17.0000 g | PACK | Freq: Two times a day (BID) | ORAL | Status: DC
  Administered 2023-05-02: 17 g via ORAL
  Filled 2023-05-01 (×3): qty 1

## 2023-05-01 MED ORDER — LACTATED RINGERS IV SOLN: INTRAVENOUS | Status: DC

## 2023-05-01 MED ORDER — ACETAMINOPHEN 500 MG PO TABS
1000.0000 mg | ORAL_TABLET | Freq: Once | ORAL | Status: AC
  Administered 2023-05-01: 1000 mg via ORAL
  Filled 2023-05-01: qty 2

## 2023-05-01 MED ORDER — LURASIDONE HCL 20 MG PO TABS
60.0000 mg | ORAL_TABLET | Freq: Every day | ORAL | Status: DC
  Administered 2023-05-02: 60 mg via ORAL
  Filled 2023-05-01 (×2): qty 3

## 2023-05-01 MED ORDER — LACTATED RINGERS IV SOLN: INTRAVENOUS | Status: DC | PRN

## 2023-05-01 MED ORDER — INSULIN ASPART 100 UNIT/ML IJ SOLN: 0.0000 [IU] | Freq: Three times a day (TID) | INTRAMUSCULAR | Status: DC

## 2023-05-01 MED ORDER — LOSARTAN POTASSIUM 50 MG PO TABS
100.0000 mg | ORAL_TABLET | Freq: Every day | ORAL | Status: DC
  Administered 2023-05-02: 100 mg via ORAL
  Filled 2023-05-01 (×2): qty 2

## 2023-05-01 MED ORDER — DULOXETINE HCL 60 MG PO CPEP
60.0000 mg | ORAL_CAPSULE | Freq: Every day | ORAL | Status: DC
  Administered 2023-05-01 – 2023-05-02 (×2): 60 mg via ORAL
  Filled 2023-05-01 (×2): qty 1

## 2023-05-01 MED ORDER — IOHEXOL 300 MG/ML  SOLN
100.0000 mL | Freq: Once | INTRAMUSCULAR | Status: AC | PRN
  Administered 2023-05-01: 100 mL via INTRAVENOUS

## 2023-05-01 MED ORDER — LINACLOTIDE 145 MCG PO CAPS
290.0000 ug | ORAL_CAPSULE | Freq: Every day | ORAL | Status: DC
  Administered 2023-05-02: 290 ug via ORAL
  Filled 2023-05-01 (×2): qty 2

## 2023-05-01 MED ORDER — PANTOPRAZOLE SODIUM 40 MG IV SOLR: 40.0000 mg | Freq: Two times a day (BID) | INTRAVENOUS | Status: DC

## 2023-05-01 MED ORDER — PROPOFOL 10 MG/ML IV BOLUS
INTRAVENOUS | Status: DC | PRN
  Administered 2023-05-01: 30 mg via INTRAVENOUS
  Administered 2023-05-01: 200 mg via INTRAVENOUS

## 2023-05-01 MED ORDER — ONDANSETRON HCL 4 MG/2ML IJ SOLN
4.0000 mg | Freq: Four times a day (QID) | INTRAMUSCULAR | Status: DC | PRN
  Administered 2023-05-01 (×2): 4 mg via INTRAVENOUS
  Filled 2023-05-01 (×2): qty 2

## 2023-05-01 MED ORDER — PANTOPRAZOLE 80MG IVPB - SIMPLE MED
80.0000 mg | Freq: Once | INTRAVENOUS | Status: AC
  Administered 2023-05-01: 80 mg via INTRAVENOUS
  Filled 2023-05-01: qty 100

## 2023-05-01 MED ORDER — SUCCINYLCHOLINE CHLORIDE 200 MG/10ML IV SOSY
PREFILLED_SYRINGE | INTRAVENOUS | Status: DC | PRN
  Administered 2023-05-01: 130 mg via INTRAVENOUS

## 2023-05-01 MED ORDER — HYDROCORTISONE ACETATE 25 MG RE SUPP
25.0000 mg | Freq: Two times a day (BID) | RECTAL | Status: DC
  Administered 2023-05-01 – 2023-05-02 (×3): 25 mg via RECTAL
  Filled 2023-05-01 (×3): qty 1

## 2023-05-01 SURGICAL SUPPLY — 15 items

## 2023-05-01 NOTE — Progress Notes (Signed)
[place]. She is AAOX4 and vital signs are stable. Call bell placed within reach and bed in lowest position.

## 2023-05-01 NOTE — Anesthesia Preprocedure Evaluation (Addendum)
Anesthesia Evaluation  Patient identified by MRN, date of birth, ID band Patient awake    Reviewed: Allergy & Precautions, NPO status , Patient's Chart, lab work & pertinent test results  History of Anesthesia Complications Negative for: history of anesthetic complications  Airway Mallampati: III  TM Distance: >3 FB Neck ROM: Full    Dental  (+) Dental Advisory Given, Teeth Intact   Pulmonary sleep apnea , Current Smoker and Patient abstained from smoking.   Pulmonary exam normal        Cardiovascular hypertension, Pt. on medications Normal cardiovascular exam     Neuro/Psych Seizures -, Well Controlled,  PSYCHIATRIC DISORDERS Anxiety Depression       GI/Hepatic ,GERD  Medicated and Controlled,,(+)     substance abuse  marijuana use  Endo/Other  diabetes  Morbid obesity On GLP-1a, last dose 2 days ago    Renal/GU negative Renal ROS     Musculoskeletal  (+)  Fibromyalgia -  Abdominal  (+) + obese  Peds  Hematology negative hematology ROS (+)   Anesthesia Other Findings Sjgren's Syndrome   Reproductive/Obstetrics  s/p tubal ligation                              Anesthesia Physical Anesthesia Plan  ASA: 3  Anesthesia Plan: General   Post-op Pain Management: Minimal or no pain anticipated   Induction: Intravenous, Rapid sequence and Cricoid pressure planned  PONV Risk Score and Plan: 2 and Treatment may vary due to age or medical condition, Ondansetron, Dexamethasone and Midazolam  Airway Management Planned: Oral ETT  Additional Equipment: None  Intra-op Plan:   Post-operative Plan: Extubation in OR  Informed Consent: I have reviewed the patients History and Physical, chart, labs and discussed the procedure including the risks, benefits and alternatives for the proposed anesthesia with the patient or authorized representative who has indicated his/her understanding and  acceptance.     Dental advisory given  Plan Discussed with: CRNA and Anesthesiologist  Anesthesia Plan Comments: (ETT with RSI given recent GLP-1a  )        Anesthesia Quick Evaluation

## 2023-05-01 NOTE — Progress Notes (Signed)
PROGRESS NOTE    [name]  ZOX:[id number] DOB: May 03, 1971 DOA: 04/30/2023 PCP: Redmond School, NP   Brief Narrative:  Care started prior to midnight in the emergency room and patient was admitted early this morning after midnight by Dr. Gery Pray and I am in current agreement with her assessment and plan.  Additional changes to the plan of care have been made accordingly. The patient is a 52 year old obese Caucasian female with past medical history significant for but limited to obstruction syndrome, Raynaud's phenomena, PCOS, hypertension, hyperlipidemia, fibromyalgia, anxiety depression, IBS, MDD, hemorrhoids, constipation, diabetes mellitus type 2, GERD as well as other comorbidities.  Yesterday she developed sudden onset of severe rectal pain and she was not straining or constipated.  She has been maintained on Lovenox and had hemorrhoids before but this was never this painful.  Her pain persisted all day and she eventually came to the ED and in the ED she reported melanotic stools.  Her last colonoscopy was about 4 years ago.  Her stool occult was done in the ED and was positive and she was initiated on Protonix drip and her hemorrhoidal pain was unresponsive nitroglycerin ointment, fentanyl injection and topical lidocaine.  Gastroenterology was consulted for further evaluation and took patient for EGD today and the EGD was showing a normal examined esophagus with a medium mount of food that was in the gastric fundus in the gastric body and gastric antrum with multiple diminutive sessile polyps with no bleeding found in the gastric fundus or the gastric body.  Examined duodenum was normal.  GI recommended same diet as tolerated and continue daily PPI and recommending general surgery evaluation to incise the thrombosed hemorrhoid along with constipation management.  General surgery evaluation is still pending.  Assessment and Plan:  Melena/GI bleed setting of thrombosed hemorrhoid -Protonix  drip initiated but changed to p.o. PPI daily given her GERD -N.p.o., GI consulted by EDP and now advancing diet as tolerated -Serial H&H every 8 -Patient with history of GERD, does take aspirin and omeprazole daily -Aspirin held but can likely be reinitiated   Hemorrhoids/Constipation -Patient denies constipation however CT scan shows evidence of constipation. -As needed morphine ordered. -Hydrocortisone enema twice daily, sitz bath as needed ordered -MiraLAX and Colace twice daily ordered.  Patient needs bowel regimen -Nitroglycerin ointment to 8 hours x 3 more doses continued in the hopes of titrating off morphine. -GI consulted and recommending a General Surgery evaluation -Linzess resumed   MDD/anxiety -[name], [name], Wellbutrin, Cymbalta, lamotrigine resumed   Essential Hypertension -C/w Norvasc, Cozaar resumed with hold parameters -Continue monitor blood pressures per protocol -Last blood pressure reading was on the softer side at 106/45  Hypercholesterolemia -Lipitor held likely can be resumed in the a.m.   Obstructive sleep apnea (adult)  -CPAP ordered   Sjogren syndrome, unspecified (HCC) -C/w with Hydroxychloroquine 400 g p.o. daily and with Cevimeline 30 mg po Dailyprn Dry Mouth   Type 2 Diabetes Mellitus with Proteinuria (HCC) -Patient takes Trulicity every Wednesday -Sensitive NovoLog sliding scale insulin initiated AC -Glucose Trend: Recent Labs  Lab 04/30/23 1944 05/01/23 0407  GLUCOSE 123* 88  -CBG Trend: Recent Labs  Lab 05/01/23 0845 05/01/23 1013 05/01/23 1203  GLUCAP 84 84 71   Hypokalemia -Patient's K+ Level Trend: Recent Labs  Lab 04/30/23 1944 05/01/23 0407  K 4.0 3.3*  -Replete with IV Kcl 40 mEQ -Continue to Monitor and Replete as Necessary -Repeat CMP in the AM   Hypoalbuminemia -Patient's Albumin Trend: Recent Labs  Lab 04/30/23 1944 05/01/23 0407  ALBUMIN 4.2 3.3*  -Continue to Monitor and Trend and repeat CMP in the  AM  Obesity -Complicates overall prognosis and care -Estimated body mass index is 36.74 kg/m as calculated from the following:   Height as of this encounter: 5\' 7"  (1.702 m).   Weight as of this encounter: 106.4 kg.  -Weight Loss and Dietary Counseling given   DVT prophylaxis: SCDs Start: 05/01/23 0345    Code Status: Full Code Family Communication: No family present at bedside  Disposition Plan:  Level of care: Telemetry Medical Status is: Observation The patient will require care spanning > 2 midnights and should be moved to inpatient because: Needs Surgical Evaluation   Consultants:  General Surgery Gastroenterology  Procedures:  EGD Impression:               - Normal esophagus.                           - A medium amount of food (residue) in the stomach.                            Consistent with gastroparesis (with GLP-1 likely                            contributing). Portions of the gastric mucosa not                            seen due to presence of food.                           - Multiple gastric polyps. Benign with typical                            appearance of fundic gland polyps.                           - Normal examined duodenum.                           - No evidence of UGI bleeding.                           - No specimens collected. Moderate Sedation:      N/A Recommendation:           - Return patient to hospital ward for ongoing care.                           - Advance diet as tolerated.                           - Continue present medications. Continue daily PPI                            given GERD.                           - General surgery to incise thrombosed hemorrhoid.                           -  Constipation management. Motegrity may be a                            better agent as an outpatient (over Linzess,                            Motegrity not on formulary in-house) given                            suspicious for global  intestinal dysmotility.                           - No plan for colonoscopy at this time.  Antimicrobials:  Anti-infectives (From admission, onward)    Start     Dose/Rate Route Frequency Ordered Stop   05/01/23 1000  hydroxychloroquine (PLAQUENIL) tablet 400 mg        400 mg Oral Daily 05/01/23 0443         Objective: Vitals:   05/01/23 1120 05/01/23 1125 05/01/23 1206 05/01/23 1549  BP: 122/74 117/69 (!) 107/47 (!) 106/45  Pulse: 65 66 68 65  Resp: 17 18 16 16   Temp:   97.8 F (36.6 C) 98.1 F (36.7 C)  TempSrc:   Oral Oral  SpO2: 98% 97% 98% 94%  Weight:      Height:        Intake/Output Summary (Last 24 hours) at 05/01/2023 1642 Last data filed at 05/01/2023 1058 Gross per 24 hour  Intake 709.87 ml  Output --  Net 709.87 ml   Filed Weights   04/30/23 1940 05/01/23 0307  Weight: 99.8 kg 106.4 kg   Data Reviewed: I have personally reviewed following labs and imaging studies  CBC: Recent Labs  Lab 04/30/23 1944 05/01/23 0407 05/01/23 1159  WBC 12.7*  --   --   NEUTROABS 8.6*  --   --   HGB 14.3 12.6 12.5  HCT 42.3 37.7 37.9  MCV 87.0  --   --   PLT 273  --   --    Basic Metabolic Panel: Recent Labs  Lab 04/30/23 1944 05/01/23 0407  NA 134* 136  K 4.0 3.3*  CL 104 105  CO2 21* 23  GLUCOSE 123* 88  BUN 11 8  CREATININE 0.76 0.80  CALCIUM 8.9 8.6*   GFR: Estimated Creatinine Clearance: 104.4 mL/min (by C-G formula based on SCr of 0.8 mg/dL). Liver Function Tests: Recent Labs  Lab 04/30/23 1944 05/01/23 0407  AST 27 21  ALT 23 22  ALKPHOS 77 58  BILITOT 0.7 0.7  PROT 7.7 6.1*  ALBUMIN 4.2 3.3*   No results for input(s): "LIPASE", "AMYLASE" in the last 168 hours. No results for input(s): "AMMONIA" in the last 168 hours. Coagulation Profile: Recent Labs  Lab 05/01/23 0407  INR 1.0   Cardiac Enzymes: No results for input(s): "CKTOTAL", "CKMB", "CKMBINDEX", "TROPONINI" in the last 168 hours. BNP (last 3 results) No results for  input(s): "PROBNP" in the last 8760 hours. HbA1C: No results for input(s): "HGBA1C" in the last 72 hours. CBG: Recent Labs  Lab 05/01/23 0845 05/01/23 1013 05/01/23 1203  GLUCAP 84 84 71   Lipid Profile: No results for input(s): "CHOL", "HDL", "LDLCALC", "TRIG", "CHOLHDL", "LDLDIRECT" in the last 72 hours. Thyroid Function Tests: No results for input(s): "TSH", "T4TOTAL", "FREET4", "T3FREE", "THYROIDAB" in  the last 72 hours. Anemia Panel: No results for input(s): "VITAMINB12", "FOLATE", "FERRITIN", "TIBC", "IRON", "RETICCTPCT" in the last 72 hours. Sepsis Labs: No results for input(s): "PROCALCITON", "LATICACIDVEN" in the last 168 hours.  No results found for this or any previous visit (from the past 240 hour(s)).   Radiology Studies: CT ABDOMEN PELVIS W CONTRAST  Result Date: 05/01/2023 CLINICAL DATA:  Blunt polytrauma, unspecified mechanism. Dark stools this morning with severe rectal pain. EXAM: CT ABDOMEN AND PELVIS WITH CONTRAST TECHNIQUE: Multidetector CT imaging of the abdomen and pelvis was performed using the standard protocol following bolus administration of intravenous contrast. RADIATION DOSE REDUCTION: This exam was performed according to the departmental dose-optimization program which includes automated exposure control, adjustment of the mA and/or kV according to patient size and/or use of iterative reconstruction technique. CONTRAST:  OMNIPAQUE IOHEXOL 300 MG/ML  SOLN COMPARISON:  CTs with IV contrast dated 03/26/2022 and 11/04/2016 FINDINGS: Lower chest: There are scattered linear scarring or atelectasis in the lung bases without infiltrates. The cardiac size is normal. There is a tiny hiatal hernia. Hepatobiliary: No focal liver abnormality is seen. No calcified gallstones, gallbladder wall thickening, or biliary dilatation. Pancreas: Mild fatty infiltration again noted. No focal abnormality. No ductal dilatation or inflammation. Spleen: Slightly prominent but  otherwise unremarkable and unchanged. 1.5 cm splenule again noted at the anteromedial aspect. Adrenals/Urinary Tract: There is no adrenal mass. There is a 1.4 cm parapelvic cyst in the inferior pole of the right kidney again noted, with a Hounsfield density of 25 consistent with a Bosniak 2 cyst. No follow-up imaging is recommended Barbie Haggis 2018 Feb; 264-273, Management of the Incidental Renal Mass on CT, [employer] 2021; 814-848, Bosniak Classification of Cystic Renal Masses, Version 2019). This is slightly larger than in 2018 but unchanged since last year. Both kidneys are otherwise homogeneous. There is no mass enhancement. There is a 2 mm nonobstructive caliceal stone in the right lower pole. No other nephrolithiasis is seen and no hydronephrosis, ureteral stones or bladder thickening. Stomach/Bowel: The gastric wall unremarkable. The unopacified small bowel is normal caliber. The appendix is normal. There is moderate stool retention ascending colon and proximal transverse segment. There are scattered sigmoid diverticula without evidence of acute diverticulitis. Vascular/Lymphatic: No significant vascular findings are present. No enlarged abdominal or pelvic lymph nodes. Reproductive: There are bilateral Essure wires in place. Left ovary and uterus are unremarkable. Right ovary has developed a 3.6 cm homogeneous cyst with a Hounsfield density of 5.4. Given patient's age, a follow-up pelvic ultrasound is recommended in 6-12 months. Reference: JACR 2020 Feb;17(2):248-254 Other: Numerous pelvic phleboliths. Small umbilical fat hernia. No free air, free fluid, free hemorrhage or incarcerated hernia. Musculoskeletal: Degenerative change thoracic and lumbar spine. No acute osseous findings. Asymmetric advanced joint space loss and osteophytosis left hip. IMPRESSION: 1. No acute trauma related findings in the abdomen or pelvis. 2. Constipation and diverticulosis. 3. 3.6 cm right ovarian cyst. Given patient's age, a  follow-up pelvic ultrasound is recommended in 6-12 months. This is new from prior studies. 4. Nonobstructive 2 mm solitary stone right kidney. 5. Remaining findings described above. Electronically Signed   By: Almira Bar M.D.   On: 05/01/2023 01:43    Scheduled Meds:  amLODipine  2.5 mg Oral Daily   buPROPion  300 mg Oral Daily   docusate sodium  100 mg Oral BID   DULoxetine  60 mg Oral Daily   hydrocortisone  25 mg Rectal BID   hydroxychloroquine  400 mg Oral Daily  insulin aspart  0-9 Units Subcutaneous TID WC   lamoTRIgine  400 mg Oral Daily   linaclotide  290 mcg Oral QAC breakfast   lisdexamfetamine  70 mg Oral q morning   losartan  100 mg Oral Daily   lurasidone  60 mg Oral Daily   nitroGLYCERIN  0.5 inch Topical Q8H   pantoprazole  40 mg Oral BID AC   polyethylene glycol  17 g Oral BID   Continuous Infusions:  sodium chloride 100 mL/hr at 05/01/23 1203    LOS: 0 days   Marguerita Merles, DO [facility] Available via Epic secure chat 7am-7pm After these hours, please refer to coverage provider listed on [email or web address] 05/01/2023, 4:42 PM

## 2023-05-01 NOTE — ED Notes (Signed)
Writer attempted to call report to floor RN at [facility]. RN with another patient and requested writer call back in 5 minutes.

## 2023-05-01 NOTE — Progress Notes (Signed)
Late entry.  Patient in endo upon visit for consult.  We are aware of patient and will see later as able.  Letha Cape 11:39 AM 05/01/2023

## 2023-05-01 NOTE — H&P (Addendum)
PCP:   Redmond School, NP   Chief Complaint:  Rectal pain  HPI: This is a 52 year old female with past medical history of Sjogren's syndrome, Raynaud's phenomena, PCOS, HTN, HLD, fibromyalgia, anxiety and depression, IBS, MDD, hemorrhoids, constipation, T2DM, and GERD.  Yesterday AM while checking a BM patient developed sudden onset of severe rectal pain.  She was not straining, she is not constipated.  Patient states she is maintained on Linzess.  She has had hemorrhoids before but this never been this painful.  Her pain persisted all day.  She eventually came to the ER.  Patient's last colonoscopy was approximately 4 years ago, hemorrhoids reported.  Unclear if this was internal or external hemorrhoids  In the ER patient reported melanotic stools.  Occult stool done was positive.  Patient started on Protonix drip.  Patient hemorrhoidal pain was unresponsive to nitroglycerin ointment, fentanyl injection, topical lidocaine.  Review of Systems:  Per HPI  Past Medical History: Past Medical History:  Diagnosis Date   Allergy    Anemia    Anxiety    Depression    Diabetes (HCC)    Elevated LFTs    Fibromyalgia    Hypercholesteremia    Hypertension    Hypomagnesemia    Morbid obesity (HCC)    Seizures (HCC)    Sjoegren syndrome    Past Surgical History:  Procedure Laterality Date   COLON SURGERY     ECT TREATMENT     ESSURE TUBAL LIGATION     TONSILLECTOMY     UPPER GASTROINTESTINAL ENDOSCOPY     WISDOM TOOTH EXTRACTION      Medications: Prior to Admission medications   Medication Sig Start Date End Date Taking? Authorizing Provider  ALPRAZolam Prudy Feeler) 1 MG tablet Take 1 mg by mouth 4 (four) times daily as needed for anxiety. 03/19/22   [provider]  amLODipine (NORVASC) 2.5 MG tablet Take 1 tablet (2.5 mg total) by mouth daily. 03/02/23   Croitoru, Mihai, MD  aspirin EC 81 MG tablet Take 81 mg by mouth daily. Swallow whole.    [provider]   atorvastatin (LIPITOR) 40 MG tablet Take 40 mg by mouth daily.    [provider]  buPROPion (WELLBUTRIN XL) 300 MG 24 hr tablet Take 300 mg by mouth daily. 03/19/22   [provider]  cevimeline (EVOXAC) 30 MG capsule Take 30 mg by mouth daily as needed (dry mouth). 01/08/22   [provider]  colchicine 0.6 MG tablet Take 0.6 mg by mouth daily. 08/16/20   [provider]  DULoxetine (CYMBALTA) 60 MG capsule Take 60 mg daily by mouth.    [provider]  glucose blood test strip Contour Next Test Strips  USE 1 STRIP TO CHECK GLUCOSE ONCE DAILY    [provider]  hydroxychloroquine (PLAQUENIL) 200 MG tablet Take 400 mg by mouth daily.    [provider]  lamoTRIgine (LAMICTAL) 200 MG tablet Take 400 mg by mouth daily. 04/11/20   [provider]  linaclotide Karlene Einstein) 290 MCG CAPS capsule Take by mouth. 01/23/23   [provider]  lisdexamfetamine (VYVANSE) 70 MG capsule Take 1 capsule (70 mg total) by mouth every morning. 09/29/22     losartan (COZAAR) 100 MG tablet Take 100 mg by mouth daily. 05/31/20   [provider]  Lurasidone HCl (LATUDA) 60 MG TABS Take 60 mg by mouth daily. 10/03/20   [provider]  Microlet Lancets MISC Microlet Lancet  USE 1  TO CHECK GLUCOSE ONCE DAILY    [provider]  omeprazole (PRILOSEC) 40 MG capsule Take 1 capsule (40 mg total) by mouth daily. 10/19/17   Hilarie Fredrickson, MD  TRULICITY 1.5 MG/0.5ML SOPN Inject 1.5 mg into the skin every Wednesday. 03/04/22   [provider]  Vitamin D, Ergocalciferol, (DRISDOL) 1.25 MG (50000 UNIT) CAPS capsule Take 1 capsule by mouth once a week. 02/11/23 02/11/24  [provider]    Allergies:   Allergies  Allergen Reactions   Penicillins Hives   Sulfa Antibiotics Hives   Sulfonamide Derivatives Hives    Social History:  reports that she quit smoking about 30 years ago. Her smoking use included  cigarettes. She has never used smokeless tobacco. She reports that she does not drink alcohol and does not use drugs.  Family History: Family History  Problem Relation Age of Onset   Multiple sclerosis Mother    Diabetes Mother        Juvenile   Psoriasis Father    Prostate cancer Father    Diabetes Father    Lupus Paternal Aunt    Breast cancer Paternal Aunt    Rheum arthritis Paternal Aunt    Breast cancer Paternal Aunt    Colon cancer Neg Hx    Esophageal cancer Neg Hx    Rectal cancer Neg Hx    Stomach cancer Neg Hx     Physical Exam: Vitals:   04/30/23 2330 05/01/23 0200 05/01/23 0215 05/01/23 0307  BP: (!) 118/56 125/69  138/65  Pulse:   69 71  Resp:   18 17  Temp: 98.2 F (36.8 C)   98.2 F (36.8 C)  TempSrc: Oral   Oral  SpO2:   96% 96%  Weight:    106.4 kg  Height:        General:  Alert and oriented times three, well developed and nourished, in pain Eyes: Pink conjunctiva, no scleral icterus ENT: Moist oral mucosa, neck supple, no thyromegaly Lungs: CTA, no use of accessory muscles Cardiovascular: RRR, no murmurs. No carotid bruits Abdomen: soft, positive BS, NTND not an acute abdomen GU: Single external hemorrhoid Neuro: CN II - XII grossly intact Musculoskeletal: strength 5/5 all extremities, no edema Skin: no rash, no decubitus Psych: appropriate patient  Labs on Admission:  Recent Labs    04/30/23 1944  NA 134*  K 4.0  CL 104  CO2 21*  GLUCOSE 123*  BUN 11  CREATININE 0.76  CALCIUM 8.9   Recent Labs    04/30/23 1944  AST 27  ALT 23  ALKPHOS 77  BILITOT 0.7  PROT 7.7  ALBUMIN 4.2    Recent Labs    04/30/23 1944  WBC 12.7*  NEUTROABS 8.6*  HGB 14.3  HCT 42.3  MCV 87.0  PLT 273    Radiological Exams on Admission: CT ABDOMEN PELVIS W CONTRAST  Result Date: 05/01/2023 CLINICAL DATA:  Blunt polytrauma, unspecified mechanism. Dark stools this morning with severe rectal pain. EXAM: CT ABDOMEN AND PELVIS WITH CONTRAST  TECHNIQUE: Multidetector CT imaging of the abdomen and pelvis was performed using the standard protocol following bolus administration of intravenous contrast. RADIATION DOSE REDUCTION: This exam was performed according to the departmental dose-optimization program which includes automated exposure control, adjustment of the mA and/or kV according to patient size and/or use of iterative reconstruction technique. CONTRAST:  OMNIPAQUE IOHEXOL 300 MG/ML  SOLN COMPARISON:  CTs with IV contrast dated 03/26/2022 and 11/04/2016 FINDINGS: Lower chest: There  are scattered linear scarring or atelectasis in the lung bases without infiltrates. The cardiac size is normal. There is a tiny hiatal hernia. Hepatobiliary: No focal liver abnormality is seen. No calcified gallstones, gallbladder wall thickening, or biliary dilatation. Pancreas: Mild fatty infiltration again noted. No focal abnormality. No ductal dilatation or inflammation. Spleen: Slightly prominent but otherwise unremarkable and unchanged. 1.5 cm splenule again noted at the anteromedial aspect. Adrenals/Urinary Tract: There is no adrenal mass. There is a 1.4 cm parapelvic cyst in the inferior pole of the right kidney again noted, with a Hounsfield density of 25 consistent with a Bosniak 2 cyst. No follow-up imaging is recommended Barbie Haggis 2018 Feb; 264-273, Management of the Incidental Renal Mass on CT, [employer] 2021; 814-848, Bosniak Classification of Cystic Renal Masses, Version 2019). This is slightly larger than in 2018 but unchanged since last year. Both kidneys are otherwise homogeneous. There is no mass enhancement. There is a 2 mm nonobstructive caliceal stone in the right lower pole. No other nephrolithiasis is seen and no hydronephrosis, ureteral stones or bladder thickening. Stomach/Bowel: The gastric wall unremarkable. The unopacified small bowel is normal caliber. The appendix is normal. There is moderate stool retention ascending colon and  proximal transverse segment. There are scattered sigmoid diverticula without evidence of acute diverticulitis. Vascular/Lymphatic: No significant vascular findings are present. No enlarged abdominal or pelvic lymph nodes. Reproductive: There are bilateral Essure wires in place. Left ovary and uterus are unremarkable. Right ovary has developed a 3.6 cm homogeneous cyst with a Hounsfield density of 5.4. Given patient's age, a follow-up pelvic ultrasound is recommended in 6-12 months. Reference: JACR 2020 Feb;17(2):248-254 Other: Numerous pelvic phleboliths. Small umbilical fat hernia. No free air, free fluid, free hemorrhage or incarcerated hernia. Musculoskeletal: Degenerative change thoracic and lumbar spine. No acute osseous findings. Asymmetric advanced joint space loss and osteophytosis left hip. IMPRESSION: 1. No acute trauma related findings in the abdomen or pelvis. 2. Constipation and diverticulosis. 3. 3.6 cm right ovarian cyst. Given patient's age, a follow-up pelvic ultrasound is recommended in 6-12 months. This is new from prior studies. 4. Nonobstructive 2 mm solitary stone right kidney. 5. Remaining findings described above. Electronically Signed   By: Almira Bar M.D.   On: 05/01/2023 01:43    Assessment/Plan Present on Admission:  Melena/GI bleed -Protonix drip initiated -N.p.o., GI consulted by EDP -Serial H&H every 8 -Patient with history of GERD, does take aspirin and omeprazole daily -Aspirin held  Hemorrhoids/constipation -Patient denies constipation however CT scan shows evidence of constipation. -As needed morphine ordered. -Hydrocortisone enema twice daily, sitz bath as needed ordered -MiraLAX and Colace twice daily ordered.  Patient needs bowel regimen -Nitroglycerin ointment to 8 hours x 3 more doses continued in the hopes of titrating off morphine.  -GI consulted. -Linzess resumed  MDD/anxiety -[name], [name], Wellbutrin, Cymbalta, lamotrigine resumed   Essential  hypertension -Norvasc, Cozaar resumed with hold parameters   Hypercholesterolemia -Lipitor held   Obstructive sleep apnea (adult)  -CPAP ordered   Sjogren syndrome, unspecified (HCC) -Plaquenil resumed   Type 2 diabetes mellitus with proteinuria (HCC) -Trulicity today never Wednesday. -Sliding scale insulin initiated  Markita Stcharles 05/01/2023, 4:04 AM

## 2023-05-01 NOTE — Progress Notes (Signed)
Plan of Care Note for accepted transfer   Patient: Nickie Woloszyk MRN: [id number]   DOA: 04/30/2023  Facility requesting transfer: [facility]   Requesting Provider: Dra. Palumbo   Reason for transfer: GI bleeding   Facility course: 52 yr old female with HTN, HLD, DM, Sjogren, depression, anxiety, and fibromyalgia who presents with melena.   Initial Hgb is 14.3. FOBT is positive.   She was started on IV PPI and ED MD sent secure chat to Dr. Tomasa Rand of GI with request for routine consultation.   Plan of care: The patient is accepted for admission to Telemetry unit, at [facility].   Author: Briscoe Deutscher, MD 05/01/2023  Check [email or web address] for on-call coverage.  Nursing staff, Please call [facility] Admits & Consults System-Wide number on Amion as soon as patient's arrival, so appropriate admitting provider can evaluate the pt.

## 2023-05-01 NOTE — Op Note (Signed)
[facility] Patient Name: [name] Procedure Date : 05/01/2023 MRN: [id number] Attending MD: Beverley Fiedler , MD, [id number] Date of Birth: 13-Mar-1971 CSN: [id number] Age: 52 Admit Type: Inpatient Procedure:                Upper GI endoscopy Indications:              Heme positive stool, report of melena Providers:                Carie Caddy. Rhea Belton, MD, Carlena Hurl, Harrington Challenger,                            Technician Referring MD:             [facility] Medicines:                General Anesthesia Complications:            No immediate complications. Estimated Blood Loss:     Estimated blood loss: none. Procedure:                Pre-Anesthesia Assessment:                           - Prior to the procedure, a History and Physical                            was performed, and patient medications and                            allergies were reviewed. The patient's tolerance of                            previous anesthesia was also reviewed. The risks                            and benefits of the procedure and the sedation                            options and risks were discussed with the patient.                            All questions were answered, and informed consent                            was obtained. Prior Anticoagulants: The patient has                            taken no anticoagulant or antiplatelet agents. ASA                            Grade Assessment: II - A patient with mild systemic                            disease. After reviewing the risks and benefits,  the patient was deemed in satisfactory condition to                            undergo the procedure.                           After obtaining informed consent, the endoscope was                            passed under direct vision. Throughout the                            procedure, the patient's blood pressure, pulse, and                             oxygen saturations were monitored continuously. The                            [id number]-H190 ([id number]) Olympus endoscope was introduced                            through the mouth, and advanced to the second part                            of duodenum. The upper GI endoscopy was                            accomplished without difficulty. The patient                            tolerated the procedure well. Scope In: Scope Out: Findings:      The examined esophagus was normal.      A medium amount of food (residue) was found in the gastric fundus, in       the gastric body and in the gastric antrum.      Multiple diminutive sessile polyps with no bleeding were found in the       gastric fundus and in the gastric body.      The examined duodenum was normal. Impression:               - Normal esophagus.                           - A medium amount of food (residue) in the stomach.                            Consistent with gastroparesis (with GLP-1 likely                            contributing). Portions of the gastric mucosa not                            seen due to presence of food.                           -  Multiple gastric polyps. Benign with typical                            appearance of fundic gland polyps.                           - Normal examined duodenum.                           - No evidence of UGI bleeding.                           - No specimens collected. Moderate Sedation:      N/A Recommendation:           - Return patient to hospital ward for ongoing care.                           - Advance diet as tolerated.                           - Continue present medications. Continue daily PPI                            given GERD.                           - General surgery to incise thrombosed hemorrhoid.                           - Constipation management. Motegrity may be a                            better agent as an outpatient (over Linz[employer],                             Motegrity not on formulary in-house) given                            suspicious for global intestinal dysmotility.                           - No plan for colonoscopy at this time. Procedure Code(s):        --- Professional ---                           [phone], Esophagogastroduodenoscopy, flexible,                            transoral; diagnostic, including collection of                            specimen(s) by brushing or washing, when performed                            (separate procedure) Diagnosis Code(s):        --- Professional ---  K31.7, Polyp of stomach and duodenum                           R19.5, Other fecal abnormalities CPT copyright 2022 [employer]. All rights reserved. The codes documented in this report are preliminary and upon coder review may  be revised to meet current compliance requirements. Beverley Fiedler, MD 05/01/2023 11:13:58 AM This report has been signed electronically. Number of Addenda: 0

## 2023-05-01 NOTE — Consult Note (Addendum)
CC/Reason for consult: Thrombosed hemorrhoid Requesting physician: Dr. Marguerita Merles  HPI: [name] is an 52 y.o. female hx of Sjogren's syndrome, Raynaud's phenomena, PCOS, HTN, HLD, fibromyalgia, anxiety and depression, IBS, MDD, hemorrhoids, constipation, T2DM, and GERD admitted to medicine service overnight with rectal pain.  Found to have melena and was taken for EGD today.  Reports that she began having significant anal pain approximately 2 days ago.  This began acutely.  She does report a history of some hemorrhoidal type issues with occasional bleeding and discomfort.  She also reports a longstanding history of constipation and that Linzess typically helps her "have a bowel movement."  She also has multiple encounters in the electronic record for fecal disimpaction.  She reports that she is due for colonoscopy and 2026.  Currently reports that the anal pain she is experience which is sharp and persistent has been gradually subsiding over the last 12 hours.  She reports is still pretty significant and she cannot lay on her back.  Denies any fever or chills.  Denies any drainage.  Denies any history of perianal abscesses.  Has been applying topical Anusol cream with some alleviation  Past Medical History:  Diagnosis Date   Allergy    Anemia    Anxiety    Depression    Diabetes (HCC)    Elevated LFTs    Fibromyalgia    Hypercholesteremia    Hypertension    Hypomagnesemia    Morbid obesity (HCC)    Seizures (HCC)    Sjoegren syndrome     Past Surgical History:  Procedure Laterality Date   COLON SURGERY     ECT TREATMENT     ESSURE TUBAL LIGATION     TONSILLECTOMY     UPPER GASTROINTESTINAL ENDOSCOPY     WISDOM TOOTH EXTRACTION      Family History  Problem Relation Age of Onset   Multiple sclerosis Mother    Diabetes Mother        Juvenile   Psoriasis Father    Prostate cancer Father    Diabetes Father    Lupus Paternal Aunt    Breast cancer Paternal Aunt     Rheum arthritis Paternal Aunt    Breast cancer Paternal Aunt    Colon cancer Neg Hx    Esophageal cancer Neg Hx    Rectal cancer Neg Hx    Stomach cancer Neg Hx     Social:  reports that she quit smoking about 30 years ago. Her smoking use included cigarettes. She has never used smokeless tobacco. She reports that she does not drink alcohol and does not use drugs.  Allergies:  Allergies  Allergen Reactions   Penicillins Hives   Sulfa Antibiotics Hives   Sulfonamide Derivatives Hives    Medications: I have reviewed the patient's current medications.  Results for orders placed or performed during the hospital encounter of 04/30/23 (from the past 48 hour(s))  Comprehensive metabolic panel     Status: Abnormal   Collection Time: 04/30/23  7:44 PM  Result Value Ref Range   Sodium 134 (L) 135 - 145 mmol/L   Potassium 4.0 3.5 - 5.1 mmol/L   Chloride 104 98 - 111 mmol/L   CO2 21 (L) 22 - 32 mmol/L   Glucose, Bld 123 (H) 70 - 99 mg/dL    Comment: Glucose reference range applies only to samples taken after fasting for at least 8 hours.   BUN 11 6 - 20 mg/dL   Creatinine, Ser  0.76 0.44 - 1.00 mg/dL   Calcium 8.9 8.9 - 19.1 mg/dL   Total Protein 7.7 6.5 - 8.1 g/dL   Albumin 4.2 3.5 - 5.0 g/dL   AST 27 15 - 41 U/L   ALT 23 0 - 44 U/L   Alkaline Phosphatase 77 38 - 126 U/L   Total Bilirubin 0.7 0.3 - 1.2 mg/dL   GFR, Estimated >47 >82 mL/min    Comment: (NOTE) Calculated using the CKD-EPI Creatinine Equation (2021)    Anion gap 9 5 - 15    Comment: Performed at [facility], [address] [place], [place] [address]  CBC with Differential     Status: Abnormal   Collection Time: 04/30/23  7:44 PM  Result Value Ref Range   WBC 12.7 (H) 4.0 - 10.5 K/uL   RBC 4.86 3.87 - 5.11 MIL/uL   Hemoglobin 14.3 12.0 - 15.0 g/dL   HCT 30.8 65.7 - 84.6 %   MCV 87.0 80.0 - 100.0 fL   MCH 29.4 26.0 - 34.0 pg   MCHC 33.8 30.0 - 36.0 g/dL   RDW 96.2 95.2 - 84.1 %   Platelets 273 150  - 400 K/uL   nRBC 0.0 0.0 - 0.2 %   Neutrophils Relative % 69 %   Neutro Abs 8.6 (H) 1.7 - 7.7 K/uL   Lymphocytes Relative 22 %   Lymphs Abs 2.9 0.7 - 4.0 K/uL   Monocytes Relative 7 %   Monocytes Absolute 0.9 0.1 - 1.0 K/uL   Eosinophils Relative 2 %   Eosinophils Absolute 0.2 0.0 - 0.5 K/uL   Basophils Relative 0 %   Basophils Absolute 0.1 0.0 - 0.1 K/uL   Immature Granulocytes 0 %   Abs Immature Granulocytes 0.04 0.00 - 0.07 K/uL    Comment: Performed at [facility], [address] [place], [place] [address]  Occult blood card to lab, stool     Status: Abnormal   Collection Time: 04/30/23 11:07 PM  Result Value Ref Range   Fecal Occult Bld POSITIVE (A) NEGATIVE    Comment: Performed at [facility], [address] [place], [place] [address]  Pregnancy, urine     Status: None   Collection Time: 05/01/23 12:47 AM  Result Value Ref Range   Preg Test, Ur NEGATIVE NEGATIVE    Comment:        THE SENSITIVITY OF THIS METHODOLOGY IS >25 mIU/mL. Performed at [facility], [address] [place], [place] [address]   Hemoglobin and hematocrit, blood     Status: None   Collection Time: 05/01/23  4:07 AM  Result Value Ref Range   Hemoglobin 12.6 12.0 - 15.0 g/dL   HCT 40.3 47.4 - 25.9 %    Comment: Performed at [facility], [address] [place], [place] [address]  HIV Antibody (routine testing w rflx)     Status: None   Collection Time: 05/01/23  4:07 AM  Result Value Ref Range   HIV Screen 4th Generation wRfx Non Reactive Non Reactive    Comment: Performed at [facility], [address] [place], [place] [address]  Comprehensive metabolic panel     Status: Abnormal   Collection Time: 05/01/23  4:07 AM  Result Value Ref Range   Sodium 136 135 - 145 mmol/L   Potassium 3.3 (L) 3.5 - 5.1 mmol/L   Chloride 105 98 - 111 mmol/L  CO2 23 22 - 32 mmol/L   Glucose, Bld 88 70 - 99 mg/dL    Comment: Glucose reference range applies only  to samples taken after fasting for at least 8 hours.   BUN 8 6 - 20 mg/dL   Creatinine, Ser 1.61 0.44 - 1.00 mg/dL   Calcium 8.6 (L) 8.9 - 10.3 mg/dL   Total Protein 6.1 (L) 6.5 - 8.1 g/dL   Albumin 3.3 (L) 3.5 - 5.0 g/dL   AST 21 15 - 41 U/L   ALT 22 0 - 44 U/L   Alkaline Phosphatase 58 38 - 126 U/L   Total Bilirubin 0.7 0.3 - 1.2 mg/dL   GFR, Estimated >09 >60 mL/min    Comment: (NOTE) Calculated using the CKD-EPI Creatinine Equation (2021)    Anion gap 8 5 - 15    Comment: Performed at [facility], [address] [place], [place] [address]  Protime-INR     Status: None   Collection Time: 05/01/23  4:07 AM  Result Value Ref Range   Prothrombin Time 13.7 11.4 - 15.2 seconds   INR 1.0 0.8 - 1.2    Comment: (NOTE) INR goal varies based on device and disease states. Performed at [facility], [address], [place], [place] [address]   Glucose, capillary     Status: None   Collection Time: 05/01/23  8:45 AM  Result Value Ref Range   Glucose-Capillary 84 70 - 99 mg/dL    Comment: Glucose reference range applies only to samples taken after fasting for at least 8 hours.  Glucose, capillary     Status: None   Collection Time: 05/01/23 10:13 AM  Result Value Ref Range   Glucose-Capillary 84 70 - 99 mg/dL    Comment: Glucose reference range applies only to samples taken after fasting for at least 8 hours.  Hemoglobin and hematocrit, blood     Status: None   Collection Time: 05/01/23 11:59 AM  Result Value Ref Range   Hemoglobin 12.5 12.0 - 15.0 g/dL   HCT 47.8 29.5 - 62.1 %    Comment: Performed at [facility], [address] [place], [place] [address]  Glucose, capillary     Status: None   Collection Time: 05/01/23 12:03 PM  Result Value Ref Range   Glucose-Capillary 71 70 - 99 mg/dL    Comment: Glucose reference range applies only to samples taken after fasting for at least 8 hours.    CT ABDOMEN PELVIS W CONTRAST  Result Date: 05/01/2023 CLINICAL  DATA:  Blunt polytrauma, unspecified mechanism. Dark stools this morning with severe rectal pain. EXAM: CT ABDOMEN AND PELVIS WITH CONTRAST TECHNIQUE: Multidetector CT imaging of the abdomen and pelvis was performed using the standard protocol following bolus administration of intravenous contrast. RADIATION DOSE REDUCTION: This exam was performed according to the departmental dose-optimization program which includes automated exposure control, adjustment of the mA and/or kV according to patient size and/or use of iterative reconstruction technique. CONTRAST:  OMNIPAQUE IOHEXOL 300 MG/ML  SOLN COMPARISON:  CTs with IV contrast dated 03/26/2022 and 11/04/2016 FINDINGS: Lower chest: There are scattered linear scarring or atelectasis in the lung bases without infiltrates. The cardiac size is normal. There is a tiny hiatal hernia. Hepatobiliary: No focal liver abnormality is seen. No calcified gallstones, gallbladder wall thickening, or biliary dilatation. Pancreas: Mild fatty infiltration again noted. No focal abnormality. No ductal dilatation or inflammation. Spleen: Slightly prominent but otherwise unremarkable and unchanged. 1.5  cm splenule again noted at the anteromedial aspect. Adrenals/Urinary Tract: There is no adrenal mass. There is a 1.4 cm parapelvic cyst in the inferior pole of the right kidney again noted, with a Hounsfield density of 25 consistent with a Bosniak 2 cyst. No follow-up imaging is recommended Barbie Haggis 2018 Feb; 264-273, Management of the Incidental Renal Mass on CT, [employer] 2021; 814-848, Bosniak Classification of Cystic Renal Masses, Version 2019). This is slightly larger than in 2018 but unchanged since last year. Both kidneys are otherwise homogeneous. There is no mass enhancement. There is a 2 mm nonobstructive caliceal stone in the right lower pole. No other nephrolithiasis is seen and no hydronephrosis, ureteral stones or bladder thickening. Stomach/Bowel: The gastric wall  unremarkable. The unopacified small bowel is normal caliber. The appendix is normal. There is moderate stool retention ascending colon and proximal transverse segment. There are scattered sigmoid diverticula without evidence of acute diverticulitis. Vascular/Lymphatic: No significant vascular findings are present. No enlarged abdominal or pelvic lymph nodes. Reproductive: There are bilateral Essure wires in place. Left ovary and uterus are unremarkable. Right ovary has developed a 3.6 cm homogeneous cyst with a Hounsfield density of 5.4. Given patient's age, a follow-up pelvic ultrasound is recommended in 6-12 months. Reference: JACR 2020 Feb;17(2):248-254 Other: Numerous pelvic phleboliths. Small umbilical fat hernia. No free air, free fluid, free hemorrhage or incarcerated hernia. Musculoskeletal: Degenerative change thoracic and lumbar spine. No acute osseous findings. Asymmetric advanced joint space loss and osteophytosis left hip. IMPRESSION: 1. No acute trauma related findings in the abdomen or pelvis. 2. Constipation and diverticulosis. 3. 3.6 cm right ovarian cyst. Given patient's age, a follow-up pelvic ultrasound is recommended in 6-12 months. This is new from prior studies. 4. Nonobstructive 2 mm solitary stone right kidney. 5. Remaining findings described above. Electronically Signed   By: Almira Bar M.D.   On: 05/01/2023 01:43    ROS - all of the below systems have been reviewed with the patient and positives are indicated with bold text General: chills, fever or night sweats Hematologic/Lymphatic: bleeding problems, blood clots GI: as per HPI  PE Blood pressure (!) 106/45, pulse 65, temperature 98.1 F (36.7 C), temperature source Oral, resp. rate 16, height 5\' 7"  (1.702 m), weight 106.4 kg, SpO2 94 %. Constitutional: NAD; conversant Eyes: Moist conjunctiva; no lid lag Lungs: Normal respiratory effort CV: RRR GI: Abd soft, ND Anorectal: external exam only -normal perianal skin with  the exception of a apparent left anterior external hemorrhoid - subacute thrombosis at present without a tense clot but acutely tender to palpation.  No erythema.  No purulent drainage. DRE/Anoscopy deferred due to perianal findings and discomfort Psychiatric: Appropriate affect  Results for orders placed or performed during the hospital encounter of 04/30/23 (from the past 48 hour(s))  Comprehensive metabolic panel     Status: Abnormal   Collection Time: 04/30/23  7:44 PM  Result Value Ref Range   Sodium 134 (L) 135 - 145 mmol/L   Potassium 4.0 3.5 - 5.1 mmol/L   Chloride 104 98 - 111 mmol/L   CO2 21 (L) 22 - 32 mmol/L   Glucose, Bld 123 (H) 70 - 99 mg/dL    Comment: Glucose reference range applies only to samples taken after fasting for at least 8 hours.   BUN 11 6 - 20 mg/dL   Creatinine, Ser 9.60 0.44 - 1.00 mg/dL   Calcium 8.9 8.9 - 45.4 mg/dL   Total Protein 7.7 6.5 - 8.1 g/dL   Albumin  4.2 3.5 - 5.0 g/dL   AST 27 15 - 41 U/L   ALT 23 0 - 44 U/L   Alkaline Phosphatase 77 38 - 126 U/L   Total Bilirubin 0.7 0.3 - 1.2 mg/dL   GFR, Estimated >16 >10 mL/min    Comment: (NOTE) Calculated using the CKD-EPI Creatinine Equation (2021)    Anion gap 9 5 - 15    Comment: Performed at [facility], [address] [place], [place] [address]  CBC with Differential     Status: Abnormal   Collection Time: 04/30/23  7:44 PM  Result Value Ref Range   WBC 12.7 (H) 4.0 - 10.5 K/uL   RBC 4.86 3.87 - 5.11 MIL/uL   Hemoglobin 14.3 12.0 - 15.0 g/dL   HCT 40.9 81.1 - 91.4 %   MCV 87.0 80.0 - 100.0 fL   MCH 29.4 26.0 - 34.0 pg   MCHC 33.8 30.0 - 36.0 g/dL   RDW 78.2 95.6 - 21.3 %   Platelets 273 150 - 400 K/uL   nRBC 0.0 0.0 - 0.2 %   Neutrophils Relative % 69 %   Neutro Abs 8.6 (H) 1.7 - 7.7 K/uL   Lymphocytes Relative 22 %   Lymphs Abs 2.9 0.7 - 4.0 K/uL   Monocytes Relative 7 %   Monocytes Absolute 0.9 0.1 - 1.0 K/uL   Eosinophils Relative 2 %   Eosinophils Absolute 0.2 0.0  - 0.5 K/uL   Basophils Relative 0 %   Basophils Absolute 0.1 0.0 - 0.1 K/uL   Immature Granulocytes 0 %   Abs Immature Granulocytes 0.04 0.00 - 0.07 K/uL    Comment: Performed at [facility], [address] [place], [place] [address]  Occult blood card to lab, stool     Status: Abnormal   Collection Time: 04/30/23 11:07 PM  Result Value Ref Range   Fecal Occult Bld POSITIVE (A) NEGATIVE    Comment: Performed at [facility], [address] [place], [place] [address]  Pregnancy, urine     Status: None   Collection Time: 05/01/23 12:47 AM  Result Value Ref Range   Preg Test, Ur NEGATIVE NEGATIVE    Comment:        THE SENSITIVITY OF THIS METHODOLOGY IS >25 mIU/mL. Performed at [facility], [address] [place], [place] [address]   Hemoglobin and hematocrit, blood     Status: None   Collection Time: 05/01/23  4:07 AM  Result Value Ref Range   Hemoglobin 12.6 12.0 - 15.0 g/dL   HCT 41.3 24.4 - 01.0 %    Comment: Performed at [facility], [address], [place], [place] [address]  HIV Antibody (routine testing w rflx)     Status: None   Collection Time: 05/01/23  4:07 AM  Result Value Ref Range   HIV Screen 4th Generation wRfx Non Reactive Non Reactive    Comment: Performed at [facility], [address] [place], [place] [address]  Comprehensive metabolic panel     Status: Abnormal   Collection Time: 05/01/23  4:07 AM  Result Value Ref Range   Sodium 136 135 - 145 mmol/L   Potassium 3.3 (L) 3.5 - 5.1 mmol/L   Chloride 105 98 - 111 mmol/L   CO2 23 22 - 32 mmol/L   Glucose, Bld 88 70 - 99 mg/dL    Comment: Glucose reference range applies only to  samples taken after fasting for at least 8 hours.   BUN 8 6 - 20 mg/dL   Creatinine, Ser 1.61 0.44 - 1.00 mg/dL   Calcium 8.6 (L) 8.9 - 10.3 mg/dL   Total Protein 6.1 (L) 6.5 - 8.1 g/dL   Albumin 3.3 (L) 3.5 - 5.0 g/dL   AST 21 15 - 41 U/L   ALT 22 0 - 44 U/L   Alkaline  Phosphatase 58 38 - 126 U/L   Total Bilirubin 0.7 0.3 - 1.2 mg/dL   GFR, Estimated >09 >60 mL/min    Comment: (NOTE) Calculated using the CKD-EPI Creatinine Equation (2021)    Anion gap 8 5 - 15    Comment: Performed at [facility], [address], [place], [place] [address]  Protime-INR     Status: None   Collection Time: 05/01/23  4:07 AM  Result Value Ref Range   Prothrombin Time 13.7 11.4 - 15.2 seconds   INR 1.0 0.8 - 1.2    Comment: (NOTE) INR goal varies based on device and disease states. Performed at [facility], [address], [place], [place] [address]   Glucose, capillary     Status: None   Collection Time: 05/01/23  8:45 AM  Result Value Ref Range   Glucose-Capillary 84 70 - 99 mg/dL    Comment: Glucose reference range applies only to samples taken after fasting for at least 8 hours.  Glucose, capillary     Status: None   Collection Time: 05/01/23 10:13 AM  Result Value Ref Range   Glucose-Capillary 84 70 - 99 mg/dL    Comment: Glucose reference range applies only to samples taken after fasting for at least 8 hours.  Hemoglobin and hematocrit, blood     Status: None   Collection Time: 05/01/23 11:59 AM  Result Value Ref Range   Hemoglobin 12.5 12.0 - 15.0 g/dL   HCT 47.8 29.5 - 62.1 %    Comment: Performed at [facility], [address] [place], [place] [address]  Glucose, capillary     Status: None   Collection Time: 05/01/23 12:03 PM  Result Value Ref Range   Glucose-Capillary 71 70 - 99 mg/dL    Comment: Glucose reference range applies only to samples taken after fasting for at least 8 hours.    CT ABDOMEN PELVIS W CONTRAST  Result Date: 05/01/2023 CLINICAL DATA:  Blunt polytrauma, unspecified mechanism. Dark stools this morning with severe rectal pain. EXAM: CT ABDOMEN AND PELVIS WITH CONTRAST TECHNIQUE: Multidetector CT imaging of the abdomen and pelvis was performed using the standard protocol following bolus administration of  intravenous contrast. RADIATION DOSE REDUCTION: This exam was performed according to the departmental dose-optimization program which includes automated exposure control, adjustment of the mA and/or kV according to patient size and/or use of iterative reconstruction technique. CONTRAST:  OMNIPAQUE IOHEXOL 300 MG/ML  SOLN COMPARISON:  CTs with IV contrast dated 03/26/2022 and 11/04/2016 FINDINGS: Lower chest: There are scattered linear scarring or atelectasis in the lung bases without infiltrates. The cardiac size is normal. There is a tiny hiatal hernia. Hepatobiliary: No focal liver abnormality is seen. No calcified gallstones, gallbladder wall thickening, or biliary dilatation. Pancreas: Mild fatty infiltration again noted. No focal abnormality. No ductal dilatation or inflammation. Spleen: Slightly prominent but otherwise unremarkable and unchanged. 1.5 cm splenule again noted at the anteromedial aspect. Adrenals/Urinary Tract: There is no adrenal mass. There is a 1.4 cm parapelvic cyst in the inferior  pole of the right kidney again noted, with a Hounsfield density of 25 consistent with a Bosniak 2 cyst. No follow-up imaging is recommended Barbie Haggis 2018 Feb; 264-273, Management of the Incidental Renal Mass on CT, [employer] 2021; 814-848, Bosniak Classification of Cystic Renal Masses, Version 2019). This is slightly larger than in 2018 but unchanged since last year. Both kidneys are otherwise homogeneous. There is no mass enhancement. There is a 2 mm nonobstructive caliceal stone in the right lower pole. No other nephrolithiasis is seen and no hydronephrosis, ureteral stones or bladder thickening. Stomach/Bowel: The gastric wall unremarkable. The unopacified small bowel is normal caliber. The appendix is normal. There is moderate stool retention ascending colon and proximal transverse segment. There are scattered sigmoid diverticula without evidence of acute diverticulitis. Vascular/Lymphatic: No  significant vascular findings are present. No enlarged abdominal or pelvic lymph nodes. Reproductive: There are bilateral Essure wires in place. Left ovary and uterus are unremarkable. Right ovary has developed a 3.6 cm homogeneous cyst with a Hounsfield density of 5.4. Given patient's age, a follow-up pelvic ultrasound is recommended in 6-12 months. Reference: JACR 2020 Feb;17(2):248-254 Other: Numerous pelvic phleboliths. Small umbilical fat hernia. No free air, free fluid, free hemorrhage or incarcerated hernia. Musculoskeletal: Degenerative change thoracic and lumbar spine. No acute osseous findings. Asymmetric advanced joint space loss and osteophytosis left hip. IMPRESSION: 1. No acute trauma related findings in the abdomen or pelvis. 2. Constipation and diverticulosis. 3. 3.6 cm right ovarian cyst. Given patient's age, a follow-up pelvic ultrasound is recommended in 6-12 months. This is new from prior studies. 4. Nonobstructive 2 mm solitary stone right kidney. 5. Remaining findings described above. Electronically Signed   By: Almira Bar M.D.   On: 05/01/2023 01:43      A/P: Sabrinna Montecillo is an 52 y.o. female with likely resolving external hemorrhoidal thrombosis  -Could have also recently had a decompressed perianal abscess but at present, there is no fluctuance or erythema or any active evidence of infection per se.  -For now, would work to keep stools as soft as possible-Linzess and/or even MiraLAX as per GI.  -Drink plenty of water throughout the day, generally at least 5 to 6 glasses of water per day.  Minimizing commode times to no more than 2 to 3 minutes.  No straining.  -For acute thromboses, if there is a tense clot, and this is detected within the first 24 to 36 hours, excision of the clot may provide some alleviation but at present, do not think she would likely benefit.  Currently, the area is relatively soft  CT A/P 6/29 did not show any acute perirectal abscesses  All the  above is been accomplished with her primary nurse, Cicero Duck, serving as a chaperone.  I spent a total of 60 minutes in both face-to-face and non-face-to-face activities, excluding procedures performed, for this visit on the date of this encounter.  Marin Olp, MD [facility], A [facility]

## 2023-05-01 NOTE — Hospital Course (Addendum)
Care started prior to midnight in the emergency room and patient was admitted early this morning after midnight by Dr. Gery Pray and I am in current agreement with her assessment and plan.  Additional changes to the plan of care have been made accordingly. The patient is a 52 year old obese Caucasian female with past medical history significant for but limited to obstruction syndrome, Raynaud's phenomena, PCOS, hypertension, hyperlipidemia, fibromyalgia, anxiety depression, IBS, MDD, hemorrhoids, constipation, diabetes mellitus type 2, GERD as well as other comorbidities.  Yesterday she developed sudden onset of severe rectal pain and she was not straining or constipated.  She has been maintained on Lovenox and had hemorrhoids before but this was never this painful.  Her pain persisted all day and she eventually came to the ED and in the ED she reported melanotic stools.  Her last colonoscopy was about 4 years ago.  Her stool occult was done in the ED and was positive and she was initiated on Protonix drip and her hemorrhoidal pain was unresponsive nitroglycerin ointment, fentanyl injection and topical lidocaine.  Gastroenterology was consulted for further evaluation and took patient for EGD today and the EGD was showing a normal examined esophagus with a medium mount of food that was in the gastric fundus in the gastric body and gastric antrum with multiple diminutive sessile polyps with no bleeding found in the gastric fundus or the gastric body.  Examined duodenum was normal.  GI recommended same diet as tolerated and continue daily PPI and recommending general surgery evaluation to incise the thrombosed hemorrhoid along with constipation management.  General surgery evaluation is still pending.  Assessment and Plan:  Melena/GI bleed setting of thrombosed hemorrhoid -Protonix drip initiated but changed to p.o. PPI daily given her GERD -N.p.o., GI consulted by EDP and now advancing diet as  tolerated -Serial H&H every 8 -Patient with history of GERD, does take aspirin and omeprazole daily -Aspirin held but can likely be reinitiated   Hemorrhoids/Constipation -Patient denies constipation however CT scan shows evidence of constipation. -As needed morphine ordered. -Hydrocortisone enema twice daily, sitz bath as needed ordered -MiraLAX and Colace twice daily ordered.  Patient needs bowel regimen -Nitroglycerin ointment to 8 hours x 3 more doses continued in the hopes of titrating off morphine. -GI consulted and recommending a General Surgery evaluation -Linzess resumed   MDD/anxiety -[name], [name], Wellbutrin, Cymbalta, lamotrigine resumed   Essential Hypertension -C/w Norvasc, Cozaar resumed with hold parameters -Continue monitor blood pressures per protocol -Last blood pressure reading was on the softer side at 106/45  Hypercholesterolemia -Lipitor held likely can be resumed in the a.m.   Obstructive sleep apnea (adult)  -CPAP ordered   Sjogren syndrome, unspecified (HCC) -C/w with Hydroxychloroquine 400 g p.o. daily and with Cevimeline 30 mg po Dailyprn Dry Mouth   Type 2 Diabetes Mellitus with Proteinuria (HCC) -Patient takes Trulicity every Wednesday -Sensitive NovoLog sliding scale insulin initiated AC -Glucose Trend: Recent Labs  Lab 04/30/23 1944 05/01/23 0407 05/02/23 0428  GLUCOSE 123* 88 83  -CBG Trend: Recent Labs  Lab 05/01/23 0845 05/01/23 1013 05/01/23 1203 05/01/23 1653 05/01/23 2039 05/02/23 0821 05/02/23 1158  GLUCAP 84 84 71 82 90 106* 105*   Hypokalemia -Patient's K+ Level Trend: Recent Labs  Lab 04/30/23 1944 05/01/23 0407 05/02/23 0428  K 4.0 3.3* 3.7  -Replete with IV Kcl 40 mEQ -Continue to Monitor and Replete as Necessary -Repeat CMP in the AM   Hypoalbuminemia -Patient's Albumin Trend: Recent Labs  Lab 04/30/23 1944 05/01/23 0407  05/02/23 0428  ALBUMIN 4.2 3.3* 3.3*  -Continue to Monitor and Trend and  repeat CMP in the AM  Obesity -Complicates overall prognosis and care -Estimated body mass index is 36.74 kg/m as calculated from the following:   Height as of this encounter: 5\' 7"  (1.702 m).   Weight as of this encounter: 106.4 kg.  -Weight Loss and Dietary Counseling given

## 2023-05-01 NOTE — Transfer of Care (Signed)
Immediate Anesthesia Transfer of Care Note  Patient: [name]  Procedure(s) Performed: ESOPHAGOGASTRODUODENOSCOPY (EGD) WITH PROPOFOL  Patient Location: PACU and Endoscopy Unit  Anesthesia Type:General  Level of Consciousness: awake, alert , oriented, and patient cooperative  Airway & Oxygen Therapy: Patient Spontanous Breathing and Patient connected to nasal cannula oxygen  Post-op Assessment: Report given to RN and Post -op Vital signs reviewed and stable  Post vital signs: Reviewed and stable  Last Vitals:  Vitals Value Taken Time  BP    Temp    Pulse    Resp    SpO2      Last Pain:  Vitals:   05/01/23 1001  TempSrc: Temporal  PainSc: 7          Complications: No notable events documented.

## 2023-05-01 NOTE — Care Management Obs Status (Signed)
MEDICARE OBSERVATION STATUS NOTIFICATION   Patient Details  Name: [name] MRN: [id number] Date of Birth: 16-Apr-1971   Medicare Observation Status Notification Given:  Yes    Tom-Johnson, Hershal Coria, RN 05/01/2023, 3:17 PM

## 2023-05-01 NOTE — Consult Note (Signed)
Consultation  Referring Provider:   [facility] Primary Care Physician:  Redmond School, NP Primary Gastroenterologist:  [facility] most recently but states no GI doctor, remotely has seen Dr. Marina Goodell       Reason for Consultation:   Rectal pain with melena positive FOBT         HPI:   [name] is a 52 y.o. female with past medical history significant for Sjogren's, Raynaud's, fibromyalgia, anxiety and depression, hypertension, hyperlipidemia, IBS C on Linzess, type 2 diabetes, GERD, mild gastroparesis seen on GES 2019, IDA followed by hematology [place] presents with severe rectal pain and melena.  GI history: Prior to 2019 patient was followed with [facility] gastroenterology, had fecal impaction at that time started on Linzess to 290.  Normal sed rate, CRP. 01/31/15  colonoscopy by Dr. Marina Goodell. Findings of a normal terminal ileum and the ascending colon and otherwise normal exam. Pathology revealed a tubular adenoma and patient was told to follow-up in 5 years. At that time patient was experiencing diarrhea.  11/2017  EGD with Dr. Marina Goodell for nausea, vomiting, decreased p.o. intake showed mild Schatzki ring, small amount of food in the stomach consistent with documented gastroparesis, gastric polyps biopsied, benign. 06/12/2020 Colonoscopy with [facility]nese for screening purposes, recall 5 years (06/2025) 02/04/2021 [facility]  with EGD for nausea, vomiting, decreased n.p.o. and mild abdominal discomfort Path showed mild chronic inflammation with focal intestinal metaplasia no evidence of malignancy, fundic gland polyps no evidence of malignancy.  Negative H. Pylori  Patient is diabetic, has history of GES 10/2017 with mild gastroparesis 77% emptied at 4 hours.  Patient is been on Trulicity for the last year 1.5. Longstanding history of ICS with previous fecal impaction, was on Linzess to 90 switched from 290 one month ago.   Patient presented to ER with acute on such rectal pain  for 1 day developed after bowel movement.  Patient also found to have melenic stools, FOBT positive.  Pain nonresponsive to NTG ointment, fentanyl injection and topical lidocaine.  CT ABP with contrast in the ER showed large volume stool burden, diverticulosis without diverticulitis.  No colitis. 3.6 cm right ovarian cyst needs follow-up, nonobstructing 2 mm right kidney stone. Patient has mild fatty infiltration under pancreas, spleen slightly prominent but otherwise unremarkable, unremarkable liver and gallbladder  Patient lying in bed, no family at bedside. 1 month ago she switched from 290 mcg Linzess to 72 mcg. States she has been having bowel movement every other day, when she does have a bowel movement they are very loose stools multiple times.  States she has not noticed dark stools at home but admits to not really paying attention. Yesterday morning patient had a bowel movement without straining and had severe rectal pain, throbbing, stabbing, burning, denies hematochezia. Patient states she has rare fecal incontinence, occasional urinary leakage. Patient is on omeprazole 40 mg daily, has never taken Reglan in the past though has been prescribed for gastroparesis. She has consistent nausea and vomiting, intermittent, decreased appetite and poor p.o. intake.  Has had some weight loss. Denies abdominal pain. Patient does smoke marijuana 2-3 times a week for appetite, denies EtOH, smoking, other drug use.  Rare NSAIDs. Denies family history of GI malignancy.  Abnormal ED labs: Abnormal Labs Reviewed  COMPREHENSIVE METABOLIC PANEL - Abnormal; Notable for the following components:      Result Value   Sodium 134 (*)    CO2 21 (*)  Glucose, Bld 123 (*)    All other components within normal limits  CBC WITH DIFFERENTIAL/PLATELET - Abnormal; Notable for the following components:   WBC 12.7 (*)    Neutro Abs 8.6 (*)    All other components within normal limits  OCCULT BLOOD X 1 CARD TO  LAB, STOOL - Abnormal; Notable for the following components:   Fecal Occult Bld POSITIVE (*)    All other components within normal limits  COMPREHENSIVE METABOLIC PANEL - Abnormal; Notable for the following components:   Potassium 3.3 (*)    Calcium 8.6 (*)    Total Protein 6.1 (*)    Albumin 3.3 (*)    All other components within normal limits     Past Medical History:  Diagnosis Date   Allergy    Anemia    Anxiety    Depression    Diabetes (HCC)    Elevated LFTs    Fibromyalgia    Hypercholesteremia    Hypertension    Hypomagnesemia    Morbid obesity (HCC)    Seizures (HCC)    Sjoegren syndrome     Surgical History:  She  has a past surgical history that includes Wisdom tooth extraction; Essure tubal ligation; Tonsillectomy; ECT TREATMENT; Upper gastrointestinal endoscopy; and Colon surgery. Family History:  Her family history includes Breast cancer in her paternal aunt and paternal aunt; Diabetes in her father and mother; Lupus in her paternal aunt; Multiple sclerosis in her mother; Prostate cancer in her father; Psoriasis in her father; Rheum arthritis in her paternal aunt. Social History:   reports that she quit smoking about 30 years ago. Her smoking use included cigarettes. She has never used smokeless tobacco. She reports that she does not drink alcohol and does not use drugs.  Prior to Admission medications   Medication Sig Start Date End Date Taking? Authorizing Provider  ALPRAZolam Prudy Feeler) 1 MG tablet Take 1 mg by mouth 4 (four) times daily as needed for anxiety. 03/19/22   [provider]  amLODipine (NORVASC) 2.5 MG tablet Take 1 tablet (2.5 mg total) by mouth daily. 03/02/23   Croitoru, Mihai, MD  aspirin EC 81 MG tablet Take 81 mg by mouth daily. Swallow whole.    [provider]  atorvastatin (LIPITOR) 40 MG tablet Take 40 mg by mouth daily.    [provider]  buPROPion (WELLBUTRIN XL) 300 MG 24 hr tablet Take 300 mg by mouth daily.  03/19/22   [provider]  cevimeline (EVOXAC) 30 MG capsule Take 30 mg by mouth daily as needed (dry mouth). 01/08/22   [provider]  colchicine 0.6 MG tablet Take 0.6 mg by mouth daily. 08/16/20   [provider]  DULoxetine (CYMBALTA) 60 MG capsule Take 60 mg daily by mouth.    [provider]  glucose blood test strip Contour Next Test Strips  USE 1 STRIP TO CHECK GLUCOSE ONCE DAILY    [provider]  hydroxychloroquine (PLAQUENIL) 200 MG tablet Take 400 mg by mouth daily.    [provider]  lamoTRIgine (LAMICTAL) 200 MG tablet Take 400 mg by mouth daily. 04/11/20   [provider]  linaclotide Karlene Einstein) 290 MCG CAPS capsule Take by mouth. 01/23/23   [provider]  lisdexamfetamine (VYVANSE) 70 MG capsule Take 1 capsule (70 mg total) by mouth every morning. 09/29/22     losartan (COZAAR) 100 MG tablet Take 100 mg by mouth daily. 05/31/20   [provider]  Lurasidone HCl (  LATUDA) 60 MG TABS Take 60 mg by mouth daily. 10/03/20   [provider]  Microlet Lancets MISC Microlet Lancet  USE 1 TO CHECK GLUCOSE ONCE DAILY    [provider]  omeprazole (PRILOSEC) 40 MG capsule Take 1 capsule (40 mg total) by mouth daily. 10/19/17   Hilarie Fredrickson, MD  TRULICITY 1.5 MG/0.5ML SOPN Inject 1.5 mg into the skin every Wednesday. 03/04/22   [provider]  Vitamin D, Ergocalciferol, (DRISDOL) 1.25 MG (50000 UNIT) CAPS capsule Take 1 capsule by mouth once a week. 02/11/23 02/11/24  [provider]    Current Facility-Administered Medications  Medication Dose Route Frequency Provider Last Rate Last Admin   0.9 %  sodium chloride infusion   Intravenous Continuous Crosley, Debby, MD 100 mL/hr at 05/01/23 0643 Rate Change at 05/01/23 0643   amLODipine (NORVASC) tablet 2.5 mg  2.5 mg Oral Daily Crosley, Debby, MD       buPROPion (WELLBUTRIN XL) 24 hr tablet 300 mg  300 mg Oral Daily Crosley,  Debby, MD       cevimeline (EVOXAC) capsule 30 mg  30 mg Oral Daily PRN Crosley, Debby, MD       docusate sodium (COLACE) capsule 100 mg  100 mg Oral BID Crosley, Debby, MD       DULoxetine (CYMBALTA) DR capsule 60 mg  60 mg Oral Daily Crosley, Debby, MD       hydrocortisone (ANUSOL-HC) suppository 25 mg  25 mg Rectal BID Crosley, Debby, MD       hydroxychloroquine (PLAQUENIL) tablet 400 mg  400 mg Oral Daily Crosley, Debby, MD       insulin aspart (novoLOG) injection 0-9 Units  0-9 Units Subcutaneous TID WC Crosley, Debby, MD       lamoTRIgine (LAMICTAL) tablet 400 mg  400 mg Oral Daily Crosley, Debby, MD       linaclotide (LINZESS) capsule 72 mcg  72 mcg Oral QAC breakfast Crosley, Debby, MD       lisdexamfetamine (VYVANSE) capsule 70 mg  70 mg Oral q morning Crosley, Debby, MD       losartan (COZAAR) tablet 100 mg  100 mg Oral Daily Crosley, Debby, MD       lurasidone (LATUDA) tablet 60 mg  60 mg Oral Daily Crosley, Debby, MD       morphine (PF) 2 MG/ML injection 2 mg  2 mg Intravenous Q4H PRN Joneen Roach, Debby, MD   2 mg at 05/01/23 0426   nitroGLYCERIN (NITROGLYN) 2 % ointment 0.5 inch  0.5 inch Topical Q8H Crosley, Debby, MD       ondansetron (ZOFRAN) tablet 4 mg  4 mg Oral Q6H PRN Crosley, Debby, MD       Or   ondansetron (ZOFRAN) injection 4 mg  4 mg Intravenous Q6H PRN Crosley, Debby, MD       pantoprazole (PROTONIX) 40 MG injection            pantoprazole (PROTONIX) 40 MG injection            pantoprozole (PROTONIX) 80 mg /NS 100 mL infusion  8 mg/hr Intravenous Continuous Palumbo, April, MD 10 mL/hr at 05/01/23 0448 8 mg/hr at 05/01/23 0448   polyethylene glycol (MIRALAX / GLYCOLAX) packet 17 g  17 g Oral BID Gery Pray, MD        Allergies as of 04/30/2023 - Review Complete 04/30/2023  Allergen Reaction Noted   Penicillins Hives 04/30/2020   Sulfa antibiotics Hives 03/19/2020  Sulfonamide derivatives Hives 04/30/2020    Review of Systems:    Constitutional: No weight  loss, fever, chills, weakness or fatigue HEENT: Eyes: No change in vision               Ears, Nose, Throat:  No change in hearing or congestion Skin: No rash or itching Cardiovascular: No chest pain, chest pressure or palpitations   Respiratory: No SOB or cough Gastrointestinal: See HPI and otherwise negative Genitourinary: No dysuria or change in urinary frequency Neurological: No headache, dizziness or syncope Musculoskeletal: No new muscle or joint pain Hematologic: No bleeding or bruising Psychiatric: No history of depression or anxiety     Physical Exam:  Vital signs in last 24 hours: Temp:  [98 F (36.7 C)-99.1 F (37.3 C)] 99.1 F (37.3 C) (06/29 0532) Pulse Rate:  [68-85] 68 (06/29 0532) Resp:  [17-20] 17 (06/29 0532) BP: (118-146)/(43-69) 123/59 (06/29 0532) SpO2:  [96 %-100 %] 98 % (06/29 0532) Weight:  [99.8 kg-106.4 kg] 106.4 kg (06/29 0307) Last BM Date : 04/30/23 Last BM recorded by nurses in past 5 days No data recorded  General:   Pleasant, obese female in no acute distress Head:  Normocephalic and atraumatic. Eyes: sclerae anicteric,conjunctive pink  Heart:  regular rate and rhythm, no murmurs or gallops Pulm: Clear anteriorly; no wheezing Abdomen:  Soft, Obese AB, Sluggish bowel sounds. mild tenderness in the epigastrium. Without guarding and Without rebound, No organomegaly appreciated. Rectal: Large left tender thrombosed external hemorrhoid, has yellow white-cream externally on rectum, no obvious abscess, erythema, likely from multiple ointments, attempted internal exam but patient has exquisite tenderness, decreased rectal tone, no masses, no stool appreciated, Extremities:  Without edema. Msk:  Symmetrical without gross deformities. Peripheral pulses intact.  Neurologic:  Alert and  oriented x4;  No focal deficits.  Skin:   Dry and intact without significant lesions or rashes. Psychiatric:  Cooperative. Normal mood and affect.  LAB RESULTS: Recent Labs     04/30/23 1944 05/01/23 0407  WBC 12.7*  --   HGB 14.3 12.6  HCT 42.3 37.7  PLT 273  --    BMET Recent Labs    04/30/23 1944 05/01/23 0407  NA 134* 136  K 4.0 3.3*  CL 104 105  CO2 21* 23  GLUCOSE 123* 88  BUN 11 8  CREATININE 0.76 0.80  CALCIUM 8.9 8.6*   LFT Recent Labs    05/01/23 0407  PROT 6.1*  ALBUMIN 3.3*  AST 21  ALT 22  ALKPHOS 58  BILITOT 0.7   PT/INR Recent Labs    05/01/23 0407  LABPROT 13.7  INR 1.0    STUDIES: CT ABDOMEN PELVIS W CONTRAST  Result Date: 05/01/2023 CLINICAL DATA:  Blunt polytrauma, unspecified mechanism. Dark stools this morning with severe rectal pain. EXAM: CT ABDOMEN AND PELVIS WITH CONTRAST TECHNIQUE: Multidetector CT imaging of the abdomen and pelvis was performed using the standard protocol following bolus administration of intravenous contrast. RADIATION DOSE REDUCTION: This exam was performed according to the departmental dose-optimization program which includes automated exposure control, adjustment of the mA and/or kV according to patient size and/or use of iterative reconstruction technique. CONTRAST:  OMNIPAQUE IOHEXOL 300 MG/ML  SOLN COMPARISON:  CTs with IV contrast dated 03/26/2022 and 11/04/2016 FINDINGS: Lower chest: There are scattered linear scarring or atelectasis in the lung bases without infiltrates. The cardiac size is normal. There is a tiny hiatal hernia. Hepatobiliary: No focal liver abnormality is seen. No calcified gallstones, gallbladder wall  thickening, or biliary dilatation. Pancreas: Mild fatty infiltration again noted. No focal abnormality. No ductal dilatation or inflammation. Spleen: Slightly prominent but otherwise unremarkable and unchanged. 1.5 cm splenule again noted at the anteromedial aspect. Adrenals/Urinary Tract: There is no adrenal mass. There is a 1.4 cm parapelvic cyst in the inferior pole of the right kidney again noted, with a Hounsfield density of 25 consistent with a Bosniak 2 cyst.  No follow-up imaging is recommended Barbie Haggis 2018 Feb; 264-273, Management of the Incidental Renal Mass on CT, [employer] 2021; 814-848, Bosniak Classification of Cystic Renal Masses, Version 2019). This is slightly larger than in 2018 but unchanged since last year. Both kidneys are otherwise homogeneous. There is no mass enhancement. There is a 2 mm nonobstructive caliceal stone in the right lower pole. No other nephrolithiasis is seen and no hydronephrosis, ureteral stones or bladder thickening. Stomach/Bowel: The gastric wall unremarkable. The unopacified small bowel is normal caliber. The appendix is normal. There is moderate stool retention ascending colon and proximal transverse segment. There are scattered sigmoid diverticula without evidence of acute diverticulitis. Vascular/Lymphatic: No significant vascular findings are present. No enlarged abdominal or pelvic lymph nodes. Reproductive: There are bilateral Essure wires in place. Left ovary and uterus are unremarkable. Right ovary has developed a 3.6 cm homogeneous cyst with a Hounsfield density of 5.4. Given patient's age, a follow-up pelvic ultrasound is recommended in 6-12 months. Reference: JACR 2020 Feb;17(2):248-254 Other: Numerous pelvic phleboliths. Small umbilical fat hernia. No free air, free fluid, free hemorrhage or incarcerated hernia. Musculoskeletal: Degenerative change thoracic and lumbar spine. No acute osseous findings. Asymmetric advanced joint space loss and osteophytosis left hip. IMPRESSION: 1. No acute trauma related findings in the abdomen or pelvis. 2. Constipation and diverticulosis. 3. 3.6 cm right ovarian cyst. Given patient's age, a follow-up pelvic ultrasound is recommended in 6-12 months. This is new from prior studies. 4. Nonobstructive 2 mm solitary stone right kidney. 5. Remaining findings described above. Electronically Signed   By: Almira Bar M.D.   On: 05/01/2023 01:43      Impression    Melena, FOBT positive  with nausea and vomiting, decreased p.o. intake EGD 02/04/2021 [facility] mild chronic inflammation and focal intestinal metaplasia but no evidence of malignancy, benign fundic gland polyps negative H. Pylori GES 20 1877% emptied at 4 hours Patient on Trulicity for last year Smokes marijuana 2-3 times weekly  Rectal pain in setting of CIC with large external thrombosed hemorrhoid on exam Colonoscopy 06/12/2020 [facility] screening purposes recall 5 years unable to see report Recent decrease Linzess from 290 to 72 mcg in the last month CT abdomen pelvis without colitis/proctitis, did show large stool burden  Type 2 diabetes On Trulicity  History of IDA Follows hematology [place] not on iron  History of Sjogren's, Raynaud's, fibromyalgia Follows rheumatology  Principal Problem:   Melena Active Problems:   Hypercholesterolemia   Essential hypertension   Type 2 diabetes mellitus with proteinuria (HCC)   Obstructive sleep apnea (adult) (pediatric)   Sjogren syndrome, unspecified (HCC)    LOS: 0 days     Plan   52 year old female with longstanding GI history of nausea, vomiting, constipation, gastroparesis, type 2 diabetes, autoimmune presents with melena and FOBT positive, acute rectal pain. Has gastroparesis worsened with Trulicity, likely from diabetes, possible global paresis component/autoimmune. Last EGD 2022 showed focal intestinal metaplasia, chronic inflammation  Will plan on endoscopic evaluation this hospitalization with Dr. Rhea Belton today Patient currently n.p.o., can have sips with meds  I  discussed risks of EGD with patient today, including risk of sedation, bleeding or perforation.  Patient provides understanding and gave verbal consent to proceed. For patient's CIC will increase Linzess from 72-2 90, with gastroparesis and possible global paresis, consider [facility] this visit or outpatient. Patient up-to-date on colonoscopy, large left thrombosed tender external  hemorrhoid. General surgery consulted, continue lidocaine, will maximize bowel regiment. Patient due for colonoscopy 2026 Counseled on marijuana cessation, cannabinoid hyperemesis. We will consider discussing outpatient with endocrinology cessation of Trulicity Monitor H&H, transfuse greater than 7  Thank you for your kind consultation, we will continue to follow.   Doree Albee  05/01/2023, 8:15 AM

## 2023-05-01 NOTE — Anesthesia Postprocedure Evaluation (Signed)
Anesthesia Post Note  Patient: Copeland Marksberry  Procedure(s) Performed: ESOPHAGOGASTRODUODENOSCOPY (EGD) WITH PROPOFOL     Patient location during evaluation: PACU Anesthesia Type: General Level of consciousness: awake and alert Pain management: pain level controlled Vital Signs Assessment: post-procedure vital signs reviewed and stable Respiratory status: spontaneous breathing, nonlabored ventilation and respiratory function stable Cardiovascular status: blood pressure returned to baseline and stable Postop Assessment: no apparent nausea or vomiting Anesthetic complications: no   No notable events documented.  Last Vitals:  Vitals:   05/01/23 1125 05/01/23 1206  BP: 117/69 (!) 107/47  Pulse: 66 68  Resp: 18 16  Temp:  36.6 C  SpO2: 97% 98%    Last Pain:  Vitals:   05/01/23 1206  TempSrc: Oral  PainSc:                  Beryle Lathe

## 2023-05-01 NOTE — ED Notes (Signed)
[facility] called for transport. 

## 2023-05-01 NOTE — ED Notes (Signed)
Patient transported to CT 

## 2023-05-01 NOTE — ED Provider Notes (Signed)
[place] [facility] Provider Note   CSN: [id number] Arrival date & time: 04/30/23  1926     History  Chief Complaint  Patient presents with   Rectal Pain   Melena    [name] is a 52 y.o. female.  The history is provided by the patient.  Rectal Bleeding Quality:  Black and tarry Amount:  Moderate Duration:  1 day Timing:  Intermittent Chronicity:  New Context: defecation and hemorrhoids   Relieved by:  Nothing Worsened by:  Nothing Ineffective treatments:  None tried Associated symptoms: no fever, no loss of consciousness and no vomiting   Risk factors: no anticoagulant use, no hx of colorectal cancer and no hx of IBD   Patient with DM presents with h/o early satiety and now melena as well as rectal pain from hemorrhoids.    Past Medical History:  Diagnosis Date   Allergy    Anemia    Anxiety    Depression    Diabetes (HCC)    Elevated LFTs    Fibromyalgia    Hypercholesteremia    Hypertension    Hypomagnesemia    Morbid obesity (HCC)    Seizures (HCC)    Sjoegren syndrome        Home Medications Prior to Admission medications   Medication Sig Start Date End Date Taking? Authorizing Provider  ALPRAZolam Prudy Feeler) 1 MG tablet Take 1 mg by mouth 4 (four) times daily as needed for anxiety. 03/19/22   [provider]  amLODipine (NORVASC) 2.5 MG tablet Take 1 tablet (2.5 mg total) by mouth daily. 03/02/23   Croitoru, Mihai, MD  aspirin EC 81 MG tablet Take 81 mg by mouth daily. Swallow whole.    [provider]  atorvastatin (LIPITOR) 40 MG tablet Take 40 mg by mouth daily.    [provider]  buPROPion (WELLBUTRIN XL) 300 MG 24 hr tablet Take 300 mg by mouth daily. 03/19/22   [provider]  cevimeline (EVOXAC) 30 MG capsule Take 30 mg by mouth daily as needed (dry mouth). 01/08/22   [provider]  colchicine 0.6 MG tablet Take 0.6 mg by mouth daily. 08/16/20   [provider]  DULoxetine (CYMBALTA) 60 MG capsule Take 60 mg daily by mouth.    [provider]  glucose blood test strip Contour Next Test Strips  USE 1 STRIP TO CHECK GLUCOSE ONCE DAILY    [provider]  hydroxychloroquine (PLAQUENIL) 200 MG tablet Take 400 mg by mouth daily.    [provider]  lamoTRIgine (LAMICTAL) 200 MG tablet Take 400 mg by mouth daily. 04/11/20   [provider]  linaclotide Karlene Einstein) 290 MCG CAPS capsule Take by mouth. 01/23/23   [provider]  lisdexamfetamine (VYVANSE) 70 MG capsule Take 1 capsule (70 mg total) by mouth every morning. 09/29/22     losartan (COZAAR) 100 MG tablet Take 100 mg by mouth daily. 05/31/20   [provider]  Lurasidone HCl (LATUDA) 60 MG TABS Take 60 mg by mouth daily. 10/03/20   [provider]  Microlet Lancets MISC Microlet Lancet  USE 1 TO CHECK GLUCOSE ONCE DAILY    [provider]  omeprazole (PRILOSEC) 40 MG capsule Take 1 capsule (40 mg total) by mouth daily. 10/19/17   Hilarie Fredrickson, MD  TRULICITY 1.5 MG/0.5ML SOPN Inject 1.5 mg into the skin every Wednesday. 03/04/22   [provider]  Vitamin D, Ergocalciferol, (DRISDOL) 1.25 MG (  50000 UNIT) CAPS capsule Take 1 capsule by mouth once a week. 02/11/23 02/11/24  [provider]      Allergies    Penicillins, Sulfa antibiotics, and Sulfonamide derivatives    Review of Systems   Review of Systems  Constitutional:  Negative for fever.  HENT:  Negative for congestion.   Eyes:  Negative for photophobia.  Gastrointestinal:  Positive for anal bleeding, blood in stool and hematochezia. Negative for vomiting.  Neurological:  Negative for loss of consciousness.  All other systems reviewed and are negative.   Physical Exam Updated Vital Signs BP (!) 118/56   Pulse 75   Temp 98.2 F (36.8 C) (Oral)   Resp 17   Ht 5\' 7"  (1.702 m)   Wt 99.8 kg   SpO2 96%   BMI 34.46 kg/m  Physical  Exam Vitals and nursing note reviewed. Exam conducted with a chaperone present.  Constitutional:      General: She is not in acute distress.    Appearance: Normal appearance. She is well-developed.  HENT:     Head: Normocephalic and atraumatic.     Nose: Nose normal.  Eyes:     Pupils: Pupils are equal, round, and reactive to light.  Cardiovascular:     Rate and Rhythm: Normal rate and regular rhythm.     Pulses: Normal pulses.     Heart sounds: Normal heart sounds.  Pulmonary:     Effort: Pulmonary effort is normal. No respiratory distress.     Breath sounds: Normal breath sounds.  Abdominal:     General: Bowel sounds are normal. There is no distension.     Palpations: Abdomen is soft.     Tenderness: There is no abdominal tenderness. There is no guarding or rebound.  Genitourinary:    Rectum: Guaiac result positive.     Comments: Hemorrhoids, not bleeding not thrombosed  Musculoskeletal:        General: Normal range of motion.     Cervical back: Normal range of motion and neck supple.  Skin:    General: Skin is dry.     Capillary Refill: Capillary refill takes less than 2 seconds.     Findings: No erythema or rash.  Neurological:     General: No focal deficit present.     Mental Status: She is alert and oriented to person, place, and time.     Deep Tendon Reflexes: Reflexes normal.  Psychiatric:        Mood and Affect: Mood normal.     ED Results / Procedures / Treatments   Labs (all labs ordered are listed, but only abnormal results are displayed) Results for orders placed or performed during the hospital encounter of 04/30/23  Comprehensive metabolic panel  Result Value Ref Range   Sodium 134 (L) 135 - 145 mmol/L   Potassium 4.0 3.5 - 5.1 mmol/L   Chloride 104 98 - 111 mmol/L   CO2 21 (L) 22 - 32 mmol/L   Glucose, Bld 123 (H) 70 - 99 mg/dL   BUN 11 6 - 20 mg/dL   Creatinine, Ser 1.61 0.44 - 1.00 mg/dL   Calcium 8.9 8.9 - 09.6 mg/dL   Total Protein 7.7 6.5 -  8.1 g/dL   Albumin 4.2 3.5 - 5.0 g/dL   AST 27 15 - 41 U/L   ALT 23 0 - 44 U/L   Alkaline Phosphatase 77 38 - 126 U/L   Total Bilirubin 0.7 0.3 - 1.2 mg/dL   GFR, Estimated >  60 >60 mL/min   Anion gap 9 5 - 15  Pregnancy, urine  Result Value Ref Range   Preg Test, Ur NEGATIVE NEGATIVE  CBC with Differential  Result Value Ref Range   WBC 12.7 (H) 4.0 - 10.5 K/uL   RBC 4.86 3.87 - 5.11 MIL/uL   Hemoglobin 14.3 12.0 - 15.0 g/dL   HCT 40.9 81.1 - 91.4 %   MCV 87.0 80.0 - 100.0 fL   MCH 29.4 26.0 - 34.0 pg   MCHC 33.8 30.0 - 36.0 g/dL   RDW 78.2 95.6 - 21.3 %   Platelets 273 150 - 400 K/uL   nRBC 0.0 0.0 - 0.2 %   Neutrophils Relative % 69 %   Neutro Abs 8.6 (H) 1.7 - 7.7 K/uL   Lymphocytes Relative 22 %   Lymphs Abs 2.9 0.7 - 4.0 K/uL   Monocytes Relative 7 %   Monocytes Absolute 0.9 0.1 - 1.0 K/uL   Eosinophils Relative 2 %   Eosinophils Absolute 0.2 0.0 - 0.5 K/uL   Basophils Relative 0 %   Basophils Absolute 0.1 0.0 - 0.1 K/uL   Immature Granulocytes 0 %   Abs Immature Granulocytes 0.04 0.00 - 0.07 K/uL  Occult blood card to lab, stool  Result Value Ref Range   Fecal Occult Bld POSITIVE (A) NEGATIVE   No results found.   Radiology No results found.  Procedures Procedures    Medications Ordered in ED Medications  pantoprazole (PROTONIX) 80 mg /NS 100 mL IVPB (80 mg Intravenous New Bag/Given 05/01/23 0121)  pantoprozole (PROTONIX) 80 mg /NS 100 mL infusion (has no administration in time range)  pantoprazole (PROTONIX) injection 40 mg (has no administration in time range)  0.9 %  sodium chloride infusion ( Intravenous New Bag/Given 05/01/23 0121)  pantoprazole (PROTONIX) 40 MG injection (0 mg  Hold 05/01/23 0126)  fentaNYL (SUBLIMAZE) injection 50 mcg (50 mcg Intravenous Given 04/30/23 2250)  lidocaine (XYLOCAINE) 2 % jelly 1 Application (1 Application Topical Given 04/30/23 2308)  acetaminophen (TYLENOL) tablet 1,000 mg (1,000 mg Oral Given 05/01/23 0122)   nitroGLYCERIN (NITROGLYN) 2 % ointment 0.5 inch (0.5 inches Topical Given 05/01/23 0123)  iohexol (OMNIPAQUE) 300 MG/ML solution 100 mL (100 mLs Intravenous Contrast Given 05/01/23 0120)    ED Course/ Medical Decision Making/ A&P                             Medical Decision Making Patient with black tarry stools.    Amount and/or Complexity of Data Reviewed Independent Historian: spouse    Details: See above  External Data Reviewed: notes.    Details: Previous outpatient notes reviewed  Labs: ordered.    Details: Fecal occult blood is positive.  Sodium 134, normal potassium 4, normal creatinine .76, normal LFTS.  Elevated white count 12.7, normal hemoglobin 14.3 normal platelet count  Radiology: ordered and independent interpretation performed.    Details: No free air by me  Discussion of management or test interpretation with external provider(s): Case d/w Dr. Antionette Char who will admit Secure chat sent to Dr. Tomasa Rand of GI for consult   Risk OTC drugs. Prescription drug management. Decision regarding hospitalization. Risk Details: Protonix initiated.  Due to ongoing rectal pain from hemorrhoids not responsive to narcotics and lidocaine, rectal nitroglycerin intiated.      Final Clinical Impression(s) / ED Diagnoses Final diagnoses:  Melena    The patient appears reasonably stabilized for admission considering the current  resources, flow, and capabilities available in the ED at this time, and I doubt any other [facility] requiring further screening and/or treatment in the ED prior to admission.  Rx / DC Orders ED Discharge Orders     None         Mashanda Ishibashi, MD 05/01/23 2130

## 2023-05-02 DIAGNOSIS — K5909 Other constipation: Secondary | ICD-10-CM

## 2023-05-02 DIAGNOSIS — K645 Perianal venous thrombosis: Secondary | ICD-10-CM | POA: Diagnosis not present

## 2023-05-02 DIAGNOSIS — E7800 Pure hypercholesterolemia, unspecified: Secondary | ICD-10-CM

## 2023-05-02 DIAGNOSIS — K921 Melena: Secondary | ICD-10-CM | POA: Diagnosis not present

## 2023-05-02 DIAGNOSIS — K219 Gastro-esophageal reflux disease without esophagitis: Secondary | ICD-10-CM

## 2023-05-02 DIAGNOSIS — K6289 Other specified diseases of anus and rectum: Secondary | ICD-10-CM | POA: Diagnosis not present

## 2023-05-02 DIAGNOSIS — E119 Type 2 diabetes mellitus without complications: Secondary | ICD-10-CM | POA: Diagnosis not present

## 2023-05-02 DIAGNOSIS — K317 Polyp of stomach and duodenum: Secondary | ICD-10-CM | POA: Diagnosis not present

## 2023-05-02 DIAGNOSIS — I10 Essential (primary) hypertension: Secondary | ICD-10-CM | POA: Diagnosis not present

## 2023-05-02 LAB — COMPREHENSIVE METABOLIC PANEL
ALT: 17 U/L (ref 0–44)
AST: 21 U/L (ref 15–41)
Albumin: 3.3 g/dL — ABNORMAL LOW (ref 3.5–5.0)
Alkaline Phosphatase: 48 U/L (ref 38–126)
Anion gap: 8 (ref 5–15)
BUN: 6 mg/dL (ref 6–20)
CO2: 22 mmol/L (ref 22–32)
Calcium: 8.7 mg/dL — ABNORMAL LOW (ref 8.9–10.3)
Chloride: 107 mmol/L (ref 98–111)
Creatinine, Ser: 0.72 mg/dL (ref 0.44–1.00)
GFR, Estimated: >60 mL/min (ref >60)
Glucose, Bld: 83 mg/dL (ref 70–99)
Potassium: 3.7 mmol/L (ref 3.5–5.1)
Sodium: 137 mmol/L (ref 135–145)
Total Bilirubin: 0.9 mg/dL (ref 0.3–1.2)
Total Protein: 5.9 g/dL — ABNORMAL LOW (ref 6.5–8.1)

## 2023-05-02 LAB — CBC WITH DIFFERENTIAL/PLATELET
Abs Immature Granulocytes: 0.03 10*3/uL (ref 0.00–0.07)
Basophils Absolute: 0 10*3/uL (ref 0.0–0.1)
Basophils Relative: 0 %
Eosinophils Absolute: 0.2 10*3/uL (ref 0.0–0.5)
Eosinophils Relative: 2 %
HCT: 36.1 % (ref 36.0–46.0)
Hemoglobin: 12.1 g/dL (ref 12.0–15.0)
Immature Granulocytes: 0 %
Lymphocytes Relative: 28 %
Lymphs Abs: 2.8 10*3/uL (ref 0.7–4.0)
MCH: 30 pg (ref 26.0–34.0)
MCHC: 33.5 g/dL (ref 30.0–36.0)
MCV: 89.6 fL (ref 80.0–100.0)
Monocytes Absolute: 0.9 10*3/uL (ref 0.1–1.0)
Monocytes Relative: 9 %
Neutro Abs: 6.2 10*3/uL (ref 1.7–7.7)
Neutrophils Relative %: 61 %
Platelets: 175 10*3/uL (ref 150–400)
RBC: 4.03 MIL/uL (ref 3.87–5.11)
RDW: 12.7 % (ref 11.5–15.5)
WBC: 10.2 10*3/uL (ref 4.0–10.5)
nRBC: 0 % (ref 0.0–0.2)

## 2023-05-02 LAB — MAGNESIUM: Magnesium: 1.8 mg/dL (ref 1.7–2.4)

## 2023-05-02 LAB — PHOSPHORUS: Phosphorus: 3.9 mg/dL (ref 2.5–4.6)

## 2023-05-02 LAB — GLUCOSE, CAPILLARY
Glucose-Capillary: 106 mg/dL — ABNORMAL HIGH (ref 70–99)
Glucose-Capillary: 105 mg/dL — ABNORMAL HIGH (ref 70–99)

## 2023-05-02 LAB — HEMOGLOBIN AND HEMATOCRIT, BLOOD
HCT: 36.5 % (ref 36.0–46.0)
Hemoglobin: 12.1 g/dL (ref 12.0–15.0)

## 2023-05-02 MED ORDER — ONDANSETRON HCL 4 MG PO TABS: 4.0000 mg | ORAL_TABLET | Freq: Four times a day (QID) | ORAL | 0 refills | Status: DC | PRN

## 2023-05-02 MED ORDER — BISACODYL 10 MG RE SUPP
10.0000 mg | RECTAL | Status: AC
  Administered 2023-05-02: 10 mg via RECTAL
  Filled 2023-05-02: qty 1

## 2023-05-02 MED ORDER — POLYETHYLENE GLYCOL 3350 17 G PO PACK: 17.0000 g | PACK | Freq: Two times a day (BID) | ORAL | 0 refills | Status: DC

## 2023-05-02 MED ORDER — MAGNESIUM SULFATE 2 GM/50ML IV SOLN
2.0000 g | Freq: Once | INTRAVENOUS | Status: AC
  Administered 2023-05-02: 2 g via INTRAVENOUS
  Filled 2023-05-02: qty 50

## 2023-05-02 MED ORDER — LIDOCAINE 4 % EX CREA: TOPICAL_CREAM | Freq: Four times a day (QID) | CUTANEOUS | 0 refills | Status: DC | PRN

## 2023-05-02 MED ORDER — DOCUSATE SODIUM 100 MG PO CAPS: 100.0000 mg | ORAL_CAPSULE | Freq: Two times a day (BID) | ORAL | 0 refills | Status: DC

## 2023-05-02 NOTE — Progress Notes (Signed)
Progress Note   Assessment    52 year old with Sjogren's syndrome, diabetes on GLP-1 therapy, chronic constipation with IBS, history of gastroparesis, prior iron deficiency, hypertension, hyperlipidemia, anxiety and depression presenting with thrombosed hemorrhoid, exacerbation of chronic constipation and melenic stool   Recommendations   1.  Thrombosed hemorrhoid --Dr. Cliffton Asters recommended conservative management.  Continue sitz bath's and 5% lidocaine. --conservative management; lidocaine and bowel regimen  2.  Heme + stools -- EGD neg.  Hgb stable.  No concern for significant GI bleeding  3.  Gastroparesis --gastroparesis diet more global GI hypomotility -- Outpatient management, [facility] a good option  4.  Chronic constipation with exacerbation --still no bowel movement despite Linzess 290 mcg yesterday and today; also on MiraLAX -- Dulcolax suppository 10 mg right now -- Continue MiraLAX 17 g twice daily -- Discharged with Linzess 290 mcg daily -- GI follow-up we can consider [facility] which should help #3 and 4  5.  GERD --continue outpatient PPI regimen  Okay for discharge home today after Dulcolax suppository I discontinued telemetry    Chief Complaint   Patient is miserable and wants to go home Still no bowel movement Able to eat a little bit of breakfast this morning Had some minor vomiting last night but no hematemesis or coffee-ground emesis Hemorrhoidal pain somewhat better; she states that the general surgeon recommended against incision and drainage   Vital signs in last 24 hours: Temp:  [97.4 F (36.3 C)-98.3 F (36.8 C)] 98.3 F (36.8 C) (06/30 0822) Pulse Rate:  [65-76] 75 (06/30 0822) Resp:  [16-20] 16 (06/30 0822) BP: (94-141)/(45-91) 141/73 (06/30 0822) SpO2:  [94 %-100 %] 97 % (06/30 0822) Last BM Date : 04/30/23  Gen: awake, alert, NAD HEENT: anicteric  CV: RRR, no mrg Pulm: CTA b/l Abd: soft, NT/ND, +BS throughout Ext: no  c/c/e Neuro: nonfocal  Intake/Output from previous day: 06/29 0701 - 06/30 0700 In: 2115 [P.O.:240; I.V.:1875] Out: -  Intake/Output this shift: No intake/output data recorded.  Lab Results: Recent Labs    04/30/23 1944 05/01/23 0407 05/01/23 1159 05/01/23 1803 05/02/23 0428  WBC 12.7*  --   --   --  10.2  HGB 14.3   < > 12.5 11.9* 12.1  HCT 42.3   < > 37.9 37.0 36.1  PLT 273  --   --   --  175   < > = values in this interval not displayed.   BMET Recent Labs    04/30/23 1944 05/01/23 0407 05/02/23 0428  NA 134* 136 137  K 4.0 3.3* 3.7  CL 104 105 107  CO2 21* 23 22  GLUCOSE 123* 88 83  BUN 11 8 6   CREATININE 0.76 0.80 0.72  CALCIUM 8.9 8.6* 8.7*   LFT Recent Labs    05/02/23 0428  PROT 5.9*  ALBUMIN 3.3*  AST 21  ALT 17  ALKPHOS 48  BILITOT 0.9   PT/INR Recent Labs    05/01/23 0407  LABPROT 13.7  INR 1.0   Hepatitis Panel No results for input(s): "HEPBSAG", "HCVAB", "HEPAIGM", "HEPBIGM" in the last 72 hours.  Studies/Results: CT ABDOMEN PELVIS W CONTRAST  Result Date: 05/01/2023 CLINICAL DATA:  Blunt polytrauma, unspecified mechanism. Dark stools this morning with severe rectal pain. EXAM: CT ABDOMEN AND PELVIS WITH CONTRAST TECHNIQUE: Multidetector CT imaging of the abdomen and pelvis was performed using the standard protocol following bolus administration of intravenous contrast. RADIATION DOSE REDUCTION: This exam was performed according to the departmental dose-optimization program  which includes automated exposure control, adjustment of the mA and/or kV according to patient size and/or use of iterative reconstruction technique. CONTRAST:  OMNIPAQUE IOHEXOL 300 MG/ML  SOLN COMPARISON:  CTs with IV contrast dated 03/26/2022 and 11/04/2016 FINDINGS: Lower chest: There are scattered linear scarring or atelectasis in the lung bases without infiltrates. The cardiac size is normal. There is a tiny hiatal hernia. Hepatobiliary: No focal liver  abnormality is seen. No calcified gallstones, gallbladder wall thickening, or biliary dilatation. Pancreas: Mild fatty infiltration again noted. No focal abnormality. No ductal dilatation or inflammation. Spleen: Slightly prominent but otherwise unremarkable and unchanged. 1.5 cm splenule again noted at the anteromedial aspect. Adrenals/Urinary Tract: There is no adrenal mass. There is a 1.4 cm parapelvic cyst in the inferior pole of the right kidney again noted, with a Hounsfield density of 25 consistent with a Bosniak 2 cyst. No follow-up imaging is recommended Barbie Haggis 2018 Feb; 264-273, Management of the Incidental Renal Mass on CT, [employer] 2021; [phone], Bosniak Classification of Cystic Renal Masses, Version 2019). This is slightly larger than in 2018 but unchanged since last year. Both kidneys are otherwise homogeneous. There is no mass enhancement. There is a 2 mm nonobstructive caliceal stone in the right lower pole. No other nephrolithiasis is seen and no hydronephrosis, ureteral stones or bladder thickening. Stomach/Bowel: The gastric wall unremarkable. The unopacified small bowel is normal caliber. The appendix is normal. There is moderate stool retention ascending colon and proximal transverse segment. There are scattered sigmoid diverticula without evidence of acute diverticulitis. Vascular/Lymphatic: No significant vascular findings are present. No enlarged abdominal or pelvic lymph nodes. Reproductive: There are bilateral Essure wires in place. Left ovary and uterus are unremarkable. Right ovary has developed a 3.6 cm homogeneous cyst with a Hounsfield density of 5.4. Given patient's age, a follow-up pelvic ultrasound is recommended in 6-12 months. Reference: JACR 2020 Feb;17(2):248-254 Other: Numerous pelvic phleboliths. Small umbilical fat hernia. No free air, free fluid, free hemorrhage or incarcerated hernia. Musculoskeletal: Degenerative change thoracic and lumbar spine. No acute osseous  findings. Asymmetric advanced joint space loss and osteophytosis left hip. IMPRESSION: 1. No acute trauma related findings in the abdomen or pelvis. 2. Constipation and diverticulosis. 3. 3.6 cm right ovarian cyst. Given patient's age, a follow-up pelvic ultrasound is recommended in 6-12 months. This is new from prior studies. 4. Nonobstructive 2 mm solitary stone right kidney. 5. Remaining findings described above. Electronically Signed   By: Almira Bar M.D.   On: 05/01/2023 01:43      LOS: 0 days   Beverley Fiedler, MD 05/02/2023, 11:03 AM See Loretha Stapler, [facility], to contact our on call provider

## 2023-05-02 NOTE — Discharge Summary (Signed)
Physician Discharge Summary   Patient: [name] MRN: [id number] DOB: 25-Aug-1971  Admit date:     04/30/2023  Discharge date: 05/02/2023  Discharge Physician: Laura Merles, DO   PCP: Laura School, NP   Recommendations at discharge:   Follow-up with PCP within 1 to 2 weeks and repeat CBC, CMP, mag, Phos within 1 week Follow-up with gastroenterology in outpatient setting and avoid constipation Follow-up with general surgery in outpatient setting if necessary  Discharge Diagnoses: Principal Problem:   Melena Active Problems:   Hypercholesterolemia   Essential hypertension   Type 2 diabetes mellitus with proteinuria (HCC)   Obstructive sleep apnea (adult) (pediatric)   Sjogren syndrome, unspecified (HCC)   Heme positive stool   Chronic constipation   External thrombosed hemorrhoids  Resolved Problems:   * No resolved hospital problems. [facility] Course: Care started prior to midnight in the emergency room and patient was admitted early this morning after midnight by Dr. Gery Franklin and I am in current agreement with her assessment and plan.  Additional changes to the plan of care have been made accordingly. The patient is a 52 year old obese Caucasian female with past medical history significant for but limited to obstruction syndrome, Raynaud's phenomena, PCOS, hypertension, hyperlipidemia, fibromyalgia, anxiety depression, IBS, MDD, hemorrhoids, constipation, diabetes mellitus type 2, GERD as well as other comorbidities.  Yesterday she developed sudden onset of severe rectal pain and she was not straining or constipated.  She has been maintained on Lovenox and had hemorrhoids before but this was never this painful.  Her pain persisted all day and she eventually came to the ED and in the ED she reported melanotic stools.  Her last colonoscopy was about 4 years ago.  Her stool occult was done in the ED and was positive and she was initiated on Protonix drip and her hemorrhoidal  pain was unresponsive nitroglycerin ointment, fentanyl injection and topical lidocaine.  Gastroenterology was consulted for further evaluation and took patient for EGD today and the EGD was showing a normal examined esophagus with a medium mount of food that was in the gastric fundus in the gastric body and gastric antrum with multiple diminutive sessile polyps with no bleeding found in the gastric fundus or the gastric body.  Examined duodenum was normal.  GI recommended same diet as tolerated and continue daily PPI and recommending general surgery evaluation to incise the thrombosed hemorrhoid along with constipation management.  General surgery evaluation is still pending.  Assessment and Plan:  Melena/GI bleed setting of thrombosed hemorrhoid -Protonix drip initiated but changed to p.o. PPI daily given her GERD -N.p.o., GI consulted by EDP and now advancing diet as tolerated -Serial H&H every 8 -Patient with history of GERD, does take aspirin and omeprazole daily -Aspirin held but can likely be reinitiated   Hemorrhoids/Constipation -Patient denies constipation however CT scan shows evidence of constipation. -As needed morphine ordered. -Hydrocortisone enema twice daily, sitz bath as needed ordered -MiraLAX and Colace twice daily ordered.  Patient needs bowel regimen -Nitroglycerin ointment to 8 hours x 3 more doses continued in the hopes of titrating off morphine. -GI consulted and recommending a General Surgery evaluation -Linzess resumed   MDD/anxiety -[name], [name], Wellbutrin, Cymbalta, lamotrigine resumed   Essential Hypertension -C/w Norvasc, Cozaar resumed with hold parameters -Continue monitor blood pressures per protocol -Last blood pressure reading was on the softer side at 106/45  Hypercholesterolemia -Lipitor held likely can be resumed in the a.m.   Obstructive sleep apnea (adult)  -CPAP  ordered   Sjogren syndrome, unspecified (HCC) -C/w with Hydroxychloroquine  400 g p.o. daily and with Cevimeline 30 mg po Dailyprn Dry Mouth   Type 2 Diabetes Mellitus with Proteinuria (HCC) -Patient takes Trulicity every Wednesday -Sensitive NovoLog sliding scale insulin initiated AC -Glucose Trend: Recent Labs  Lab 04/30/23 1944 05/01/23 0407  GLUCOSE 123* 88  -CBG Trend: Recent Labs  Lab 05/01/23 0845 05/01/23 1013 05/01/23 1203  GLUCAP 84 84 71   Hypokalemia -Patient's K+ Level Trend: Recent Labs  Lab 04/30/23 1944 05/01/23 0407  K 4.0 3.3*  -Replete with IV Kcl 40 mEQ -Continue to Monitor and Replete as Necessary -Repeat CMP in the AM   Hypoalbuminemia -Patient's Albumin Trend: Recent Labs  Lab 04/30/23 1944 05/01/23 0407  ALBUMIN 4.2 3.3*  -Continue to Monitor and Trend and repeat CMP in the AM  Obesity -Complicates overall prognosis and care -Estimated body mass index is 36.74 kg/m as calculated from the following:   Height as of this encounter: 5\' 7"  (1.702 m).   Weight as of this encounter: 106.4 kg.  -Weight Loss and Dietary Counseling given  Consultants: Gastroenterology, General Surgery Procedures performed: EGD  Disposition: Home Diet recommendation:  Discharge Diet Orders (From admission, onward)     Start     Ordered   05/02/23 0000  Diet - low sodium heart healthy        05/02/23 1230   05/02/23 0000  Diet Carb Modified        05/02/23 1230           Cardiac diet DISCHARGE MEDICATION: Allergies as of 05/02/2023       Reactions   Penicillins Hives   Sulfa Antibiotics Hives   Sulfonamide Derivatives Hives        Medication List     TAKE these medications    ALPRAZolam 1 MG tablet Commonly known as: XANAX Take 1 mg by mouth 4 (four) times daily as needed for anxiety.   amLODipine 2.5 MG tablet Commonly known as: NORVASC Take 1 tablet (2.5 mg total) by mouth daily.   aspirin EC 81 MG tablet Take 81 mg by mouth daily. Swallow whole.   atorvastatin 40 MG tablet Commonly known as:  LIPITOR Take 40 mg by mouth daily.   buPROPion 300 MG 24 hr tablet Commonly known as: WELLBUTRIN XL Take 300 mg by mouth daily.   cevimeline 30 MG capsule Commonly known as: EVOXAC Take 30 mg by mouth daily as needed (dry mouth).   colchicine 0.6 MG tablet Take 1.2 mg by mouth daily.   docusate sodium 100 MG capsule Commonly known as: COLACE Take 1 capsule (100 mg total) by mouth 2 (two) times daily.   DULoxetine 60 MG capsule Commonly known as: CYMBALTA Take 60 mg daily by mouth.   hydroxychloroquine 200 MG tablet Commonly known as: PLAQUENIL Take 400 mg by mouth daily.   lamoTRIgine 200 MG tablet Commonly known as: LAMICTAL Take 400 mg by mouth daily.   Latuda 60 MG Tabs Generic drug: Lurasidone HCl Take 60 mg by mouth daily.   lidocaine 4 % cream Commonly known as: LMX Apply topically 4 (four) times daily as needed (hemorrhoid pain).   linaclotide 290 MCG Caps capsule Commonly known as: LINZESS Take 290 mcg by mouth daily.   lisdexamfetamine 70 MG capsule Commonly known as: Vyvanse Take 1 capsule (70 mg total) by mouth every morning.   losartan 100 MG tablet Commonly known as: COZAAR Take 100 mg by mouth daily.  omeprazole 40 MG capsule Commonly known as: PRILOSEC Take 1 capsule (40 mg total) by mouth daily.   ondansetron 4 MG tablet Commonly known as: ZOFRAN Take 1 tablet (4 mg total) by mouth every 6 (six) hours as needed for nausea.   polyethylene glycol 17 g packet Commonly known as: MIRALAX / GLYCOLAX Take 17 g by mouth 2 (two) times daily.   Trulicity 1.5 MG/0.5ML Sopn Generic drug: Dulaglutide Inject 1.5 mg into the skin every Wednesday.        Discharge Exam: Filed Weights   04/30/23 1940 05/01/23 0307  Weight: 99.8 kg 106.4 kg   Vitals:   05/02/23 0518 05/02/23 0822  BP: (!) 117/54 (!) 141/73  Pulse: 76 75  Resp: 20 16  Temp: 98.2 F (36.8 C) 98.3 F (36.8 C)  SpO2: 97% 97%   Examination: Physical  Exam:  Constitutional: WN/WD, NAD and appears calm and comfortable Eyes: PERRL, lids and conjunctivae normal, sclerae anicteric  ENMT: External Ears, Nose appear normal. Grossly normal hearing. Mucous membranes are moist. Posterior pharynx clear of any exudate or lesions. Normal dentition.  Neck: Appears normal, supple, no cervical masses, normal ROM, no appreciable thyromegaly Respiratory: Clear to auscultation bilaterally, no wheezing, rales, rhonchi or crackles. Normal respiratory effort and patient is not tachypenic. No accessory muscle use.  Cardiovascular: RRR, no murmurs / rubs / gallops. S1 and S2 auscultated. No extremity edema. 2+ pedal pulses. No carotid bruits.  Abdomen: Soft, non-tender, non-distended. No masses palpated. No appreciable hepatosplenomegaly. Bowel sounds positive.  GU: Deferred. Musculoskeletal: No clubbing / cyanosis of digits/nails. No joint deformity upper and lower extremities. Good ROM, no contractures. Normal strength and muscle tone.  Skin: No rashes, lesions, ulcers. No induration; Warm and dry.  Neurologic: CN 2-12 grossly intact with no focal deficits. Sensation intact in all 4 Extremities, DTR normal. Strength 5/5 in all 4. Romberg sign cerebellar reflexes not assessed.  Psychiatric: Normal judgment and insight. Alert and oriented x 3. Normal mood and appropriate affect.   Condition at discharge: stable  The results of significant diagnostics from this hospitalization (including imaging, microbiology, ancillary and laboratory) are listed below for reference.   Imaging Studies: CT ABDOMEN PELVIS W CONTRAST  Result Date: 05/01/2023 CLINICAL DATA:  Blunt polytrauma, unspecified mechanism. Dark stools this morning with severe rectal pain. EXAM: CT ABDOMEN AND PELVIS WITH CONTRAST TECHNIQUE: Multidetector CT imaging of the abdomen and pelvis was performed using the standard protocol following bolus administration of intravenous contrast. RADIATION DOSE  REDUCTION: This exam was performed according to the departmental dose-optimization program which includes automated exposure control, adjustment of the mA and/or kV according to patient size and/or use of iterative reconstruction technique. CONTRAST:  OMNIPAQUE IOHEXOL 300 MG/ML  SOLN COMPARISON:  CTs with IV contrast dated 03/26/2022 and 11/04/2016 FINDINGS: Lower chest: There are scattered linear scarring or atelectasis in the lung bases without infiltrates. The cardiac size is normal. There is a tiny hiatal hernia. Hepatobiliary: No focal liver abnormality is seen. No calcified gallstones, gallbladder wall thickening, or biliary dilatation. Pancreas: Mild fatty infiltration again noted. No focal abnormality. No ductal dilatation or inflammation. Spleen: Slightly prominent but otherwise unremarkable and unchanged. 1.5 cm splenule again noted at the anteromedial aspect. Adrenals/Urinary Tract: There is no adrenal mass. There is a 1.4 cm parapelvic cyst in the inferior pole of the right kidney again noted, with a Hounsfield density of 25 consistent with a Bosniak 2 cyst. No follow-up imaging is recommended [facility] 2018 Feb; 264-273, Management of  the Incidental Renal Mass on CT, [employer] 2021; 814-848, Bosniak Classification of Cystic Renal Masses, Version 2019). This is slightly larger than in 2018 but unchanged since last year. Both kidneys are otherwise homogeneous. There is no mass enhancement. There is a 2 mm nonobstructive caliceal stone in the right lower pole. No other nephrolithiasis is seen and no hydronephrosis, ureteral stones or bladder thickening. Stomach/Bowel: The gastric wall unremarkable. The unopacified small bowel is normal caliber. The appendix is normal. There is moderate stool retention ascending colon and proximal transverse segment. There are scattered sigmoid diverticula without evidence of acute diverticulitis. Vascular/Lymphatic: No significant vascular findings are present. No  enlarged abdominal or pelvic lymph nodes. Reproductive: There are bilateral Essure wires in place. Left ovary and uterus are unremarkable. Right ovary has developed a 3.6 cm homogeneous cyst with a Hounsfield density of 5.4. Given patient's age, a follow-up pelvic ultrasound is recommended in 6-12 months. Reference: JACR 2020 Feb;17(2):248-254 Other: Numerous pelvic phleboliths. Small umbilical fat hernia. No free air, free fluid, free hemorrhage or incarcerated hernia. Musculoskeletal: Degenerative change thoracic and lumbar spine. No acute osseous findings. Asymmetric advanced joint space loss and osteophytosis left hip. IMPRESSION: 1. No acute trauma related findings in the abdomen or pelvis. 2. Constipation and diverticulosis. 3. 3.6 cm right ovarian cyst. Given patient's age, a follow-up pelvic ultrasound is recommended in 6-12 months. This is new from prior studies. 4. Nonobstructive 2 mm solitary stone right kidney. 5. Remaining findings described above. Electronically Signed   By: Almira Bar M.D.   On: 05/01/2023 01:43    Microbiology: Results for orders placed or performed during the hospital encounter of 06/06/16  Urine culture     Status: None   Collection Time: 06/06/16 11:15 AM   Specimen: Urine, Catheterized  Result Value Ref Range Status   Specimen Description URINE, CATHETERIZED  Final   Special Requests NONE  Final   Culture NO GROWTH Performed at [facility]   Final   Report Status 06/07/2016 FINAL  Final    Labs: CBC: Recent Labs  Lab 04/30/23 1944 05/01/23 0407 05/01/23 1159 05/01/23 1803 05/02/23 0428 05/02/23 1156  WBC 12.7*  --   --   --  10.2  --   NEUTROABS 8.6*  --   --   --  6.2  --   HGB 14.3 12.6 12.5 11.9* 12.1 12.1  HCT 42.3 37.7 37.9 37.0 36.1 36.5  MCV 87.0  --   --   --  89.6  --   PLT 273  --   --   --  175  --    Basic Metabolic Panel: Recent Labs  Lab 04/30/23 1944 05/01/23 0407 05/02/23 0428  NA 134* 136 137  K 4.0 3.3* 3.7   CL 104 105 107  CO2 21* 23 22  GLUCOSE 123* 88 83  BUN 11 8 6   CREATININE 0.76 0.80 0.72  CALCIUM 8.9 8.6* 8.7*  MG  --   --  1.8  PHOS  --   --  3.9   Liver Function Tests: Recent Labs  Lab 04/30/23 1944 05/01/23 0407 05/02/23 0428  AST 27 21 21   ALT 23 22 17   ALKPHOS 77 58 48  BILITOT 0.7 0.7 0.9  PROT 7.7 6.1* 5.9*  ALBUMIN 4.2 3.3* 3.3*   CBG: Recent Labs  Lab 05/01/23 1203 05/01/23 1653 05/01/23 2039 05/02/23 0821 05/02/23 1158  GLUCAP 71 82 90 106* 105*   Discharge time spent: greater than 30 minutes.  Signed: Marguerita Merles, DO [facility] 05/02/2023

## 2023-05-02 NOTE — Progress Notes (Signed)
Discharge instructions given to pt. Pt verbalized understanding of all teaching and had no further questions. Pt discharged to home with husband via wheelchair with all belongings and paperwork.

## 2023-05-03 ENCOUNTER — Encounter (HOSPITAL_COMMUNITY): Payer: Self-pay | Admitting: Internal Medicine

## 2023-05-03 ENCOUNTER — Telehealth: Payer: Self-pay

## 2023-05-03 LAB — HEMOGLOBIN A1C
Hgb A1c MFr Bld: 5.4 % (ref 4.8–5.6)
Mean Plasma Glucose: 108 mg/dL

## 2023-05-03 NOTE — Telephone Encounter (Signed)
Pt states the surgeon did not take care of her thrombosed hemorrhoid prior to her leaving hospital, he stated she did not need this done. Pt is calling stating she is having terrible pain and feels as bad as she did if not worse than when she was in the hospital. She was requesting an emergency appt with Dr. Rhea Belton for the hemorrhoid. Discussed with her that she should really contact the surgeons office, per hospital consult note she was seen by Dr. Marin Olp. Pt was given the phone number to contact [facility] to see if she can be seen urgently, should not need a referral as they saw her in the hospital. Pt verbalized understanding.

## 2023-05-03 NOTE — Telephone Encounter (Signed)
See phone note, discussed with pt that she needed to contact the surgeons office for the thrombosed hemorrhoid.

## 2023-05-03 NOTE — Telephone Encounter (Signed)
Inbound call from patient requesting a f/u call from a nurse in regards to upcoming apt. Please advise.

## 2023-05-03 NOTE — Telephone Encounter (Signed)
-----   Message from Beverley Fiedler, MD sent at 05/02/2023 11:08 AM EDT ----- OV with collier in 3-4 weeks Hospital followup [facility], gastroparesis, hemorrhoid

## 2023-05-03 NOTE — Telephone Encounter (Signed)
Pt scheduled to see Quentin Mulling PA 06/02/23 at 1:30pm. Appt letter mailed to pt.

## 2023-05-10 NOTE — Progress Notes (Unsigned)
Chief Complaint: Primary GI MD: Dr. Rhea Belton  HPI: 52 year old female past medical history significant for Sjogren's, Raynaud's, fibromyalgia, anxiety and depression, hypertension, hyperlipidemia, IBS C on Linzess, type 2 diabetes (on GLP-1), GERD, mild gastroparesis seen on GES 2019, IDA followed by hematology [place] presents for hospital follow-up.  Recently admitted 6/29 to 05/02/23 for melena and thrombosed hemorrhoid.  Was evaluated by Dr. Cliffton Asters inpatient for excision of thrombosed hemorrhoid and was not thought to be beneficial since at the time of his evaluation the area was relatively soft.  Was provided conservative management with sitz bath's and 5% lidocaine  CT abdomen pelvis with contrast showed large volume stool burden, diverticulosis without diverticulitis.  No colitis.  3.6 cm right ovarian cyst.  2 mm right kidney stone.  Mild fatty infiltration under pancreas, spleen slightly prominent but otherwise unremarkable.  Unremarkable liver and gallbladder.  During hospitalization patient underwent EGD which showed normal esophagus, medium amount of food in stomach consistent with gastroparesis, multiple gastric polyps appearing as fundic gland polyps, normal duodenum, no evidence UGI bleeding, no specimens collected.  Consider Motegrity as outpatient for global intestinal dysmotility  PREVIOUS GI WORKUP   Prior to 2019 patient was followed with [facility] gastro[facility]ology, had fecal impaction at that time started on Linzess to 290.  Normal sed rate, CRP. 01/31/15  colonoscopy by Dr. Marina Goodell. Findings of a normal terminal ileum and the ascending colon and otherwise normal exam. Pathology revealed a tubular adenoma and patient was told to follow-up in 5 years. At that time patient was experiencing diarrhea.  11/2017  EGD with Dr. Marina Goodell for nausea, vomiting, decreased p.o. intake showed mild Schatzki ring, small amount of food in the stomach consistent with documented gastroparesis,  gastric polyps biopsied, benign. 06/12/2020 Colonoscopy with Britt Bolognese for screening purposes, recall 5 years (06/2025) 02/04/2021 [facility]  with EGD for nausea, vomiting, decreased n.p.o. and mild abdominal discomfort Path showed mild chronic inflammation with focal intestinal metaplasia no evidence of malignancy, fundic gland polyps no evidence of malignancy.  Negative H. Pylori   Patient is diabetic, has history of GES 10/2017 with mild gastroparesis 77% emptied at 4 hours.  Patient is been on Trulicity for the last year 1.5.  Past Medical History:  Diagnosis Date   Allergy    Anemia    Anxiety    Depression    Diabetes (HCC)    Elevated LFTs    Fibromyalgia    Hypercholesteremia    Hypertension    Hypomagnesemia    Morbid obesity (HCC)    Seizures (HCC)    Sjoegren syndrome     Past Surgical History:  Procedure Laterality Date   COLON SURGERY     ECT TREATMENT     ESOPHAGOGASTRODUODENOSCOPY (EGD) WITH PROPOFOL N/A 05/01/2023   Procedure: ESOPHAGOGASTRODUODENOSCOPY (EGD) WITH PROPOFOL;  Surgeon: Beverley Fiedler, MD;  Location: [facility] ENDOSCOPY;  Service: Gastroenterology;  Laterality: N/A;   ESSURE TUBAL LIGATION     TONSILLECTOMY     UPPER GASTROINTESTINAL ENDOSCOPY     WISDOM TOOTH EXTRACTION      Current Outpatient Medications  Medication Sig Dispense Refill   ALPRAZolam (XANAX) 1 MG tablet Take 1 mg by mouth 4 (four) times daily as needed for anxiety.     amLODipine (NORVASC) 2.5 MG tablet Take 1 tablet (2.5 mg total) by mouth daily. 90 tablet 3   aspirin EC 81 MG tablet Take 81 mg by mouth daily. Swallow whole.     atorvastatin (LIPITOR) 40 MG  tablet Take 40 mg by mouth daily.     buPROPion (WELLBUTRIN XL) 300 MG 24 hr tablet Take 300 mg by mouth daily.     cevimeline (EVOXAC) 30 MG capsule Take 30 mg by mouth daily as needed (dry mouth).     colchicine 0.6 MG tablet Take 1.2 mg by mouth daily.     docusate sodium (COLACE) 100 MG capsule Take 1 capsule (100 mg total) by  mouth 2 (two) times daily. 10 capsule 0   DULoxetine (CYMBALTA) 60 MG capsule Take 60 mg daily by mouth.     hydroxychloroquine (PLAQUENIL) 200 MG tablet Take 400 mg by mouth daily.     lamoTRIgine (LAMICTAL) 200 MG tablet Take 400 mg by mouth daily.     lidocaine (LMX) 4 % cream Apply topically 4 (four) times daily as needed (hemorrhoid pain). 30 g 0   linaclotide (LINZESS) 290 MCG CAPS capsule Take 290 mcg by mouth daily.     lisdexamfetamine (VYVANSE) 70 MG capsule Take 1 capsule (70 mg total) by mouth every morning. 30 capsule 0   losartan (COZAAR) 100 MG tablet Take 100 mg by mouth daily.     Lurasidone HCl (LATUDA) 60 MG TABS Take 60 mg by mouth daily.     omeprazole (PRILOSEC) 40 MG capsule Take 1 capsule (40 mg total) by mouth daily. 90 capsule 3   ondansetron (ZOFRAN) 4 MG tablet Take 1 tablet (4 mg total) by mouth every 6 (six) hours as needed for nausea. 20 tablet 0   polyethylene glycol (MIRALAX / GLYCOLAX) 17 g packet Take 17 g by mouth 2 (two) times daily. 14 each 0   TRULICITY 1.5 MG/0.5ML SOPN Inject 1.5 mg into the skin every Wednesday.     No current facility-administered medications for this visit.    Allergies as of 05/11/2023 - Review Complete 05/01/2023  Allergen Reaction Noted   Penicillins Hives 04/30/2020   Sulfa antibiotics Hives 03/19/2020   Sulfonamide derivatives Hives 04/30/2020    Family History  Problem Relation Age of Onset   Multiple sclerosis Mother    Diabetes Mother        Juvenile   Psoriasis Father    Prostate cancer Father    Diabetes Father    Lupus Paternal Aunt    Breast cancer Paternal Aunt    Rheum arthritis Paternal Aunt    Breast cancer Paternal Aunt    Colon cancer Neg Hx    Esophageal cancer Neg Hx    Rectal cancer Neg Hx    Stomach cancer Neg Hx     Social History   Socioeconomic History   Marital status: Married    Spouse name: Not on file   Number of children: 2   Years of education: Not on file   Highest education  level: Not on file  Occupational History   Occupation: disabled  Tobacco Use   Smoking status: Former    Types: Cigarettes    Quit date: 1994    Years since quitting: 30.5   Smokeless tobacco: Never  Vaping Use   Vaping Use: Never used  Substance and Sexual Activity   Alcohol use: No    Alcohol/week: 0.0 standard drinks of alcohol   Drug use: No   Sexual activity: Not on file  Other Topics Concern   Not on file  Social History Narrative   Lives at home w husband   R handed   Caffeine: 1 C coffee a day.    Social Determinants  of Health   Financial Resource Strain: Not on file  Food Insecurity: No Food Insecurity (05/01/2023)   Hunger Vital Sign    Worried About Running Out of Food in the Last Year: Never true    Ran Out of Food in the Last Year: Never true  Transportation Needs: Not on file  Physical Activity: Not on file  Stress: Not on file  Social Connections: Not on file  Intimate Partner Violence: Not on file    Review of Systems:    Constitutional: No weight loss, fever, chills, weakness or fatigue HEENT: Eyes: No change in vision               Ears, Nose, Throat:  No change in hearing or congestion Skin: No rash or itching Cardiovascular: No chest pain, chest pressure or palpitations   Respiratory: No SOB or cough Gastrointestinal: See HPI and otherwise negative Genitourinary: No dysuria or change in urinary frequency Neurological: No headache, dizziness or syncope Musculoskeletal: No new muscle or joint pain Hematologic: No bleeding or bruising Psychiatric: No history of depression or anxiety    Physical Exam:  Vital signs: There were no vitals taken for this visit.  Constitutional: NAD, Well developed, Well nourished, alert and cooperative Head:  Normocephalic and atraumatic. Eyes:   PEERL, EOMI. No icterus. Conjunctiva pink. Respiratory: Respirations even and unlabored. Lungs clear to auscultation bilaterally.   No wheezes, crackles, or rhonchi.   Cardiovascular:  Regular rate and rhythm. No peripheral edema, cyanosis or pallor.  Gastrointestinal:  Soft, nondistended, nontender. No rebound or guarding. Normal bowel sounds. No appreciable masses or hepatomegaly. Rectal:  Not performed.  Msk:  Symmetrical without gross deformities. Without edema, no deformity or joint abnormality.  Neurologic:  Alert and  oriented x4;  grossly normal neurologically.  Skin:   Dry and intact without significant lesions or rashes. Psychiatric: Oriented to person, place and time. Demonstrates good judgement and reason without abnormal affect or behaviors.   RELEVANT LABS AND IMAGING: CBC    Component Value Date/Time   WBC 10.2 05/02/2023 0428   RBC 4.03 05/02/2023 0428   HGB 12.1 05/02/2023 1156   HGB 13.4 11/24/2021 1405   HCT 36.5 05/02/2023 1156   PLT 175 05/02/2023 0428   PLT 265 11/24/2021 1405   MCV 89.6 05/02/2023 0428   MCH 30.0 05/02/2023 0428   MCHC 33.5 05/02/2023 0428   RDW 12.7 05/02/2023 0428   LYMPHSABS 2.8 05/02/2023 0428   MONOABS 0.9 05/02/2023 0428   EOSABS 0.2 05/02/2023 0428   BASOSABS 0.0 05/02/2023 0428    CMP     Component Value Date/Time   NA 137 05/02/2023 0428   NA 138 01/09/2022 1524   K 3.7 05/02/2023 0428   CL 107 05/02/2023 0428   CO2 22 05/02/2023 0428   GLUCOSE 83 05/02/2023 0428   BUN 6 05/02/2023 0428   BUN 9 01/09/2022 1524   CREATININE 0.72 05/02/2023 0428   CREATININE 0.73 11/24/2021 1405   CALCIUM 8.7 (L) 05/02/2023 0428   PROT 5.9 (L) 05/02/2023 0428   ALBUMIN 3.3 (L) 05/02/2023 0428   AST 21 05/02/2023 0428   AST 24 11/24/2021 1405   ALT 17 05/02/2023 0428   ALT 29 11/24/2021 1405   ALKPHOS 48 05/02/2023 0428   BILITOT 0.9 05/02/2023 0428   BILITOT 0.4 11/24/2021 1405   GFRNONAA >60 05/02/2023 0428   GFRNONAA >60 11/24/2021 1405   GFRAA >60 04/30/2020 1400    Assessment: 1. ***  Plan: 1. ***  [name] [facility] 05/10/2023, 9:57 AM  Cc:  Redmond School, NP

## 2023-05-11 ENCOUNTER — Other Ambulatory Visit: Payer: Self-pay | Admitting: Gastroenterology

## 2023-05-11 ENCOUNTER — Encounter: Payer: Self-pay | Admitting: Gastroenterology

## 2023-05-11 ENCOUNTER — Ambulatory Visit: Payer: Medicare HMO | Admitting: Gastroenterology

## 2023-05-11 VITALS — BP 122/80 | HR 68 | Ht 67.0 in | Wt 233.5 lb

## 2023-05-11 DIAGNOSIS — K5900 Constipation, unspecified: Secondary | ICD-10-CM | POA: Diagnosis not present

## 2023-05-11 MED ORDER — MOTEGRITY 2 MG PO TABS: 2.0000 mg | ORAL_TABLET | Freq: Every day | ORAL | 4 refills | Status: DC

## 2023-05-11 NOTE — Patient Instructions (Addendum)
_______________________________________________________  If your blood pressure at your visit was 140/90 or greater, please contact your primary care physician to follow up on this.  If you are age 51 or younger, your body mass index should be between 19-25. Your Body mass index is 36.57 kg/m. If this is out of the aformentioned range listed, please consider follow up with your Primary Care Provider.  ________________________________________________________  The [facility] providers would like to encourage you to use [facility] to communicate with providers for non-urgent requests or questions.  Due to long hold times on the telephone, sending your provider a message by [facility] may be a faster and more efficient way to get a response.  Please allow 48 business hours for a response.  Please remember that this is for non-urgent requests.  _______________________________________________________  We have sent the following medications to your pharmacy for you to pick up at your convenience:  CONTINUE Linzess until [employer] is approved by insurance.  START:  Motegrity 2mg  one tablet by mouth daliy  You are scheduled to follow up on 08-03-23 at 3pm.  Thank you for entrusting me with your care and choosing [facility].  [place], PA-C

## 2023-05-12 NOTE — Progress Notes (Signed)
Addendum: Reviewed and agree with assessment and management plan. Sagrario Lineberry M, MD  

## 2023-05-14 ENCOUNTER — Telehealth: Payer: Self-pay | Admitting: Gastroenterology

## 2023-05-14 NOTE — Telephone Encounter (Signed)
Inbound call from patient requesting a call back regarding 7/9 office visit. Requesting to know if she should be scheduled for a colonoscopy. States the hospital advised her not to have one. Would like to discuss further. Please advise, thank you.

## 2023-05-14 NOTE — Telephone Encounter (Signed)
Returned call to the patient who had questions about the need for a colonoscopy.  Patient was recently admitted to the hospital and had an EGD for melena/dark stool.  Patient advised she did have multiple polyps with no bleeding found during EGD.    Patient advised she has hemorrhoids that is most likely the cause of the bright red blood from rectum that she also complained of.  She has been referred to Dr Cliffton Asters at [facility].  She is aware that the last colonoscopy was in 2021 and there was no polyps at that time.  Patient advised she will not need a colonoscopy until 2026.  Patient agreed to plan and verbalized understanding.

## 2023-05-27 NOTE — Telephone Encounter (Signed)
Patient called stated she was given Motegrity and the pharmacy advised her it needs PA.

## 2023-05-28 ENCOUNTER — Telehealth: Payer: Self-pay

## 2023-05-28 ENCOUNTER — Encounter: Payer: Self-pay | Admitting: Family

## 2023-05-28 ENCOUNTER — Other Ambulatory Visit (HOSPITAL_COMMUNITY): Payer: Self-pay

## 2023-05-28 NOTE — Telephone Encounter (Signed)
 PA has been submitted and documented in separate encounter, please sign off on rx in this encounter as PA team is unable to resolve RX requests. Thank you

## 2023-05-28 NOTE — Telephone Encounter (Signed)
Pharmacy Patient Advocate Encounter   Received notification from Pt Calls Messages that prior authorization for Motegrity 2 mg tablets is required/requested.   Insurance verification completed.   The patient is insured through [employer] [facility] .   Per test claim: PA required; PA submitted to [employer] [facility] via CoverMyMeds Key/confirmation #/EOC [id number] Status is pending

## 2023-05-28 NOTE — Telephone Encounter (Signed)
Pharmacy Patient Advocate Encounter  Received notification from [employer] [facility] that Prior Authorization for MOTEGRITY 2MG  has been APPROVED from 7.26.24 to 12.31.24. Ran test claim, Copay is $0.  PA #/Case ID/Reference #: [id number]

## 2023-06-02 ENCOUNTER — Ambulatory Visit: Payer: Medicare HMO | Admitting: Physician Assistant

## 2023-06-04 ENCOUNTER — Telehealth: Payer: Self-pay | Admitting: Gastroenterology

## 2023-06-04 NOTE — Telephone Encounter (Signed)
Inbound call from patient wanting to see if [facility] would send a prescription for Zofran due to  nausea.  Please advise.

## 2023-06-07 NOTE — Telephone Encounter (Signed)
Left message for patient to call back  

## 2023-06-07 NOTE — Telephone Encounter (Signed)
Patient calls in with c/o chronic nausea, although she states worse the last few days. No vomiting, no abdominal pain. Patient tells me she is only on her second day of Motegrity for constipation/large stool burden seen on recent CT scan. Has documented gastroparesis on 10/2017 GES and retained food in stomach on EGD 04/2023. Patient is also taking Trulicity. Patient says she has not been on any zofran, phenergan, reglan etc for her nausea/gastroparesis.   [facility]-Please advise.Marland KitchenMarland Kitchen

## 2023-06-08 MED ORDER — ONDANSETRON HCL 4 MG PO TABS: 4.0000 mg | ORAL_TABLET | Freq: Four times a day (QID) | ORAL | 0 refills | Status: DC | PRN

## 2023-06-08 NOTE — Telephone Encounter (Signed)
Zofran rx has been sent to patient pharmacy. Patient advised.

## 2023-06-14 ENCOUNTER — Telehealth: Payer: Self-pay | Admitting: Gastroenterology

## 2023-06-14 NOTE — Telephone Encounter (Signed)
PT is on motegrity and it is working great for her gastro paresis but it is not helping her constipation at all. Her stomach is in knots and she is seeking options for relief. Please advise.

## 2023-06-14 NOTE — Telephone Encounter (Signed)
Patient called indicating that she has been taking Motegrity over the last couple of weeks and it has been helping her gastroparesis symptoms. However, notes that she continues to have severe constipation. Notes that she did have a bwoel movement today, although it was small and required straining. Patient states she was previously told to discontinue Miralax while taking Motegrity. I have asked that she increase water intake, add back in 1 capful of miralax (17 g) once daily in addition to the Motegrity and let us know if this is more effective.

## 2023-07-06 ENCOUNTER — Telehealth: Payer: Self-pay | Admitting: Cardiovascular Disease

## 2023-07-06 DIAGNOSIS — Z01812 Encounter for preprocedural laboratory examination: Secondary | ICD-10-CM

## 2023-07-06 DIAGNOSIS — I7121 Aneurysm of the ascending aorta, without rupture: Secondary | ICD-10-CM

## 2023-07-06 NOTE — Telephone Encounter (Signed)
Patient called to follow-up on getting a heart catheter scan.

## 2023-07-06 NOTE — Telephone Encounter (Signed)
Returned call to pt. Pt was seen by Dr. Royann Shivers in April and pt states "Dr. Salena Saner told me if I wanted to have the Heart Cat Scan done to call in Sept". Pt is wanting to know if this can be ordered now. Please advise.

## 2023-07-07 NOTE — Telephone Encounter (Signed)
Please order a CT angiogram of the chest for ascending aortic aneurysm. Will need updated BMET. Hold losartan on day of test.

## 2023-07-07 NOTE — Telephone Encounter (Signed)
Called and left a message that the scan is ordered and given call back number.   Per Dr Royann Shivers:  Please order a CT angiogram of the chest for ascending aortic aneurysm. Will need updated BMET. Hold losartan on day of test.      Placed order for CT- to be done at [facility] [address] [place], [place]- the patient will receive a call to get this scheduled. Placed order for BMP- this will need to be done prior to your CT. After your CT is scheduled, just come to [facility] to get lab drawn.               On the day of the CT- patient will need to Hold Losartan

## 2023-07-07 NOTE — Telephone Encounter (Signed)
Will forward this to K. Wendie Simmer, RN as she is the one who called pt and pt is returning her call.

## 2023-07-07 NOTE — Addendum Note (Signed)
Addended by: Scheryl Marten on: 07/07/2023 11:16 AM   Modules accepted: Orders

## 2023-07-07 NOTE — Telephone Encounter (Signed)
Returned the pt's call and went over the information. She verbalized understanding.

## 2023-07-07 NOTE — Telephone Encounter (Signed)
Pt was returning nurse Orpah Cobb L call and is requesting a callback. Please advise.

## 2023-07-08 ENCOUNTER — Encounter: Payer: Self-pay | Admitting: Cardiovascular Disease

## 2023-07-15 ENCOUNTER — Other Ambulatory Visit (HOSPITAL_BASED_OUTPATIENT_CLINIC_OR_DEPARTMENT_OTHER): Payer: Self-pay

## 2023-07-15 ENCOUNTER — Telehealth: Payer: Self-pay | Admitting: Cardiovascular Disease

## 2023-07-15 DIAGNOSIS — E7800 Pure hypercholesterolemia, unspecified: Secondary | ICD-10-CM

## 2023-07-15 NOTE — Telephone Encounter (Signed)
Advised that referral was placed and someone will reach out to her for scheduling

## 2023-07-15 NOTE — Telephone Encounter (Signed)
Patient ask regarding another choice.  Labs from outside provider in chart for review. She does state doing everything she is supposed to.Please advise

## 2023-07-15 NOTE — Telephone Encounter (Signed)
Pt c/o medication issue:  1. Name of Medication:  atorvastatin (LIPITOR) 40 MG tablet  2. How are you currently taking this medication (dosage and times per day)?  Once daily  3. Are you having a reaction (difficulty breathing--STAT)?   4. What is your medication issue?   Patient states she recently had a cholesterol panel with her PCP and it was elevated. She states she has been exercising and she doesn't eat bad. She states she is on Lipitor 40 MG and has been for about 20 years, but would like to know if she can be put on something more efficient.

## 2023-07-15 NOTE — Telephone Encounter (Signed)
Please have referral to lipid clinic placed so we can discuss at apt with patient

## 2023-07-20 ENCOUNTER — Other Ambulatory Visit (HOSPITAL_BASED_OUTPATIENT_CLINIC_OR_DEPARTMENT_OTHER): Payer: Self-pay

## 2023-07-21 ENCOUNTER — Ambulatory Visit
Admission: RE | Admit: 2023-07-21 | Discharge: 2023-07-21 | Disposition: A | Discharge status: Home / Self Care | Payer: Medicare HMO | Source: Ambulatory Visit | Attending: Cardiovascular Disease | Admitting: Cardiovascular Disease

## 2023-07-21 ENCOUNTER — Other Ambulatory Visit: Payer: Self-pay | Admitting: Obstetrics and Gynecology

## 2023-07-21 DIAGNOSIS — I7121 Aneurysm of the ascending aorta, without rupture: Secondary | ICD-10-CM

## 2023-07-21 DIAGNOSIS — R928 Other abnormal and inconclusive findings on diagnostic imaging of breast: Secondary | ICD-10-CM

## 2023-07-21 MED ORDER — IOPAMIDOL (ISOVUE-370) INJECTION 76%
75.0000 mL | Freq: Once | INTRAVENOUS | Status: AC | PRN
  Administered 2023-07-21: 75 mL via INTRAVENOUS

## 2023-07-22 ENCOUNTER — Encounter: Payer: Self-pay | Admitting: Medical Oncology

## 2023-07-22 ENCOUNTER — Inpatient Hospital Stay: Payer: Medicare HMO | Attending: Medical Oncology | Admitting: Medical Oncology

## 2023-07-22 ENCOUNTER — Inpatient Hospital Stay: Payer: Medicare HMO

## 2023-07-22 VITALS — BP 114/59 | HR 67 | Temp 98.0°F | Resp 18 | Wt 230.0 lb

## 2023-07-22 VITALS — BP 111/43 | HR 62 | Resp 16

## 2023-07-22 DIAGNOSIS — D509 Iron deficiency anemia, unspecified: Secondary | ICD-10-CM

## 2023-07-22 DIAGNOSIS — D500 Iron deficiency anemia secondary to blood loss (chronic): Secondary | ICD-10-CM | POA: Insufficient documentation

## 2023-07-22 DIAGNOSIS — K922 Gastrointestinal hemorrhage, unspecified: Secondary | ICD-10-CM | POA: Diagnosis present

## 2023-07-22 MED ORDER — SODIUM CHLORIDE 0.9 % IV SOLN
300.0000 mg | Freq: Once | INTRAVENOUS | Status: AC
  Administered 2023-07-22: 300 mg via INTRAVENOUS
  Filled 2023-07-22: qty 300

## 2023-07-22 MED ORDER — SODIUM CHLORIDE 0.9 % IV SOLN: Freq: Once | INTRAVENOUS | Status: AC

## 2023-07-22 NOTE — Progress Notes (Signed)
Hematology and Oncology Follow Up Visit  [name] [id number] Sep 11, 1971 52 y.o. 07/22/2023   Principle Diagnosis:  Iron deficiency anemia - GI loss   Current Therapy: IV iron as indicated   Work Up:  01/31/15  colonoscopy by Dr. Marina Goodell. Findings of a normal terminal ileum and the ascending colon and otherwise normal exam. Pathology revealed a tubular adenoma and patient was told to follow-up in 5 years. At that time patient was experiencing diarrhea.  11/2017  EGD with Dr. Marina Goodell for nausea, vomiting, decreased p.o. intake showed mild Schatzki ring, small amount of food in the stomach consistent with documented gastroparesis, gastric polyps biopsied, benign. 06/12/2020 Colonoscopy with Britt Bolognese for screening purposes, recall 5 years (06/2025) 02/04/2021 [facility]  with EGD for nausea, vomiting, decreased n.p.o. and mild abdominal discomfort Path showed mild chronic inflammation with focal intestinal metaplasia no evidence of malignancy, fundic gland polyps no evidence of malignancy.  Negative H. Pylori 05/01/2023 Endoscopy with [facility]. Benign polyps without bleeding for melena   Interim History:  [name] is here today for follow-up.   She is doing well - had some fatigue but this is fairly stable.  Her cycles has been irregular and flow light. She denies chance of pregnancy. No other blood loss noted.  Not taking oral iron due to severe constipation with oral iron No abnormal bruising, no petechiae.  Fever, chills, n/v, cough, rash, dizziness, SOB, chest pain, palpitations, abdominal pain or changes in bowel or bladder habits.  No swelling, tenderness, numbness or tingling in her extremities.  No falls or syncope.  She has maintained a good appetite and is staying well hydrated.   Wt Readings from Last 3 Encounters:  07/22/23 230 lb 0.6 oz (104.3 kg)  05/11/23 233 lb 8 oz (105.9 kg)  05/01/23 234 lb 9.1 oz (106.4 kg)   ECOG Performance Status: 1 - Symptomatic but completely  ambulatory  Medications:  Allergies as of 07/22/2023       Reactions   Penicillins Hives   Sulfa Antibiotics Hives   Sulfonamide Derivatives Hives        Medication List        Accurate as of July 22, 2023 10:45 AM. If you have any questions, ask your nurse or doctor.          ALPRAZolam 1 MG tablet Commonly known as: XANAX Take 1 mg by mouth 4 (four) times daily as needed for anxiety.   amLODipine 2.5 MG tablet Commonly known as: NORVASC Take 1 tablet (2.5 mg total) by mouth daily.   aspirin EC 81 MG tablet Take 81 mg by mouth daily. Swallow whole.   atorvastatin 40 MG tablet Commonly known as: LIPITOR Take 40 mg by mouth daily.   buPROPion 300 MG 24 hr tablet Commonly known as: WELLBUTRIN XL Take 300 mg by mouth daily.   cevimeline 30 MG capsule Commonly known as: EVOXAC Take 30 mg by mouth daily as needed (dry mouth).   colchicine 0.6 MG tablet Take 1.2 mg by mouth daily.   docusate sodium 100 MG capsule Commonly known as: COLACE Take 1 capsule (100 mg total) by mouth 2 (two) times daily.   DULoxetine 60 MG capsule Commonly known as: CYMBALTA Take 60 mg daily by mouth.   hydroxychloroquine 200 MG tablet Commonly known as: PLAQUENIL Take 400 mg by mouth daily.   lamoTRIgine 200 MG tablet Commonly known as: LAMICTAL Take 400 mg by mouth daily.   Latuda 60 MG Tabs Generic drug: Lurasidone HCl  Take 60 mg by mouth daily.   lisdexamfetamine 70 MG capsule Commonly known as: Vyvanse Take 1 capsule (70 mg total) by mouth every morning.   losartan 100 MG tablet Commonly known as: COZAAR Take 100 mg by mouth daily.   Motegrity 2 MG Tabs Generic drug: Prucalopride Succinate Take 1 tablet (2 mg total) by mouth daily.   omeprazole 40 MG capsule Commonly known as: PRILOSEC Take 1 capsule (40 mg total) by mouth daily.   ondansetron 4 MG tablet Commonly known as: ZOFRAN Take 1 tablet (4 mg total) by mouth every 6 (six) hours as needed for  nausea.   polyethylene glycol 17 g packet Commonly known as: MIRALAX / GLYCOLAX Take 17 g by mouth 2 (two) times daily.   Trulicity 1.5 MG/0.5ML Sopn Generic drug: Dulaglutide Inject 1.5 mg into the skin every Wednesday.        Allergies:  Allergies  Allergen Reactions   Penicillins Hives   Sulfa Antibiotics Hives   Sulfonamide Derivatives Hives    Past Medical History, Surgical history, Social history, and Family History were reviewed and updated.  Review of Systems: All other 10 point review of systems is negative.   Physical Exam:  weight is 230 lb 0.6 oz (104.3 kg). Her oral temperature is 98 F (36.7 C). Her blood pressure is 114/59 (abnormal) and her pulse is 67. Her respiration is 18 and oxygen saturation is 99%.   Wt Readings from Last 3 Encounters:  07/22/23 230 lb 0.6 oz (104.3 kg)  05/11/23 233 lb 8 oz (105.9 kg)  05/01/23 234 lb 9.1 oz (106.4 kg)    Ocular: Sclerae unicteric, pupils equal, round and reactive to light Ear-nose-throat: Oropharynx clear, dentition fair Lymphatic: No cervical or supraclavicular adenopathy Lungs no rales or rhonchi, good excursion bilaterally Heart regular rate and rhythm, no murmur appreciated Abd soft, nontender, positive bowel sounds MSK no focal spinal tenderness, no joint edema Neuro: non-focal, well-oriented, appropriate affect  Lab Results  Component Value Date   WBC 10.2 05/02/2023   HGB 12.1 05/02/2023   HCT 36.5 05/02/2023   MCV 89.6 05/02/2023   PLT 175 05/02/2023   Lab Results  Component Value Date   FERRITIN 31 11/24/2021   IRON 73 11/24/2021   TIBC 335 11/24/2021   UIBC 262 11/24/2021   IRONPCTSAT 22 11/24/2021   Lab Results  Component Value Date   RETICCTPCT 1.6 11/24/2021   RBC 4.03 05/02/2023   No results found for: "KPAFRELGTCHN", "LAMBDASER", "[facility]" Lab Results  Component Value Date   IGGSERUM 1,134 05/19/2021   IGA 154 05/19/2021   IGMSERUM 124 05/19/2021   No results found  for: "TOTALPROTELP", "ALBUMINELP", "A1GS", "A2GS", "BETS", "BETA2SER", "GAMS", "MSPIKE", "SPEI"   Chemistry      Component Value Date/Time   NA 137 05/02/2023 0428   NA 138 01/09/2022 1524   K 3.7 05/02/2023 0428   CL 107 05/02/2023 0428   CO2 22 05/02/2023 0428   BUN 6 05/02/2023 0428   BUN 9 01/09/2022 1524   CREATININE 0.72 05/02/2023 0428   CREATININE 0.73 11/24/2021 1405      Component Value Date/Time   CALCIUM 8.7 (L) 05/02/2023 0428   ALKPHOS 48 05/02/2023 0428   AST 21 05/02/2023 0428   AST 24 11/24/2021 1405   ALT 17 05/02/2023 0428   ALT 29 11/24/2021 1405   BILITOT 0.9 05/02/2023 0428   BILITOT 0.4 11/24/2021 1405      Impression and Plan: [name] is a very pleasant  52 yo caucasian female with history iron deficiency anemia secondary to GI bleed as well as several autoimmune issues including Sjogren's, fibromyalgia and type II diabetes. See GI work up above.   Recent labs from her PCP visit on 07/14/2023 show an iron level of 40, ferritin of 7, iron saturation of 9%- no CBC collected. Last iron infusion was on 04/2020. Discussed the utility of a capsule study which she will discuss with GI.   She would benefit from IV iron- orders placed- Venofer 300 mg weekly x 3 weeks total RTC 3 months APP, labs ( CBC w/, CMP, ferritin, iron, retic)-[place]   Rushie Chestnut, PA-C 9/19/202410:45 AM

## 2023-07-22 NOTE — Patient Instructions (Signed)
Iron Sucrose Injection What is this medication? IRON SUCROSE (EYE ern SOO krose) treats low levels of iron (iron deficiency anemia) in people with kidney disease. Iron is a mineral that plays an important role in making red blood cells, which carry oxygen from your lungs to the rest of your body. This medicine may be used for other purposes; ask your health care provider or pharmacist if you have questions. COMMON BRAND NAME(S): Venofer What should I tell my care team before I take this medication? They need to know if you have any of these conditions: Anemia not caused by low iron levels Heart disease High levels of iron in the blood Kidney disease Liver disease An unusual or allergic reaction to iron, other medications, foods, dyes, or preservatives Pregnant or trying to get pregnant Breastfeeding How should I use this medication? This medication is for infusion into a vein. It is given in a hospital or clinic setting. Talk to your care team about the use of this medication in children. While this medication may be prescribed for children as young as 2 years for selected conditions, precautions do apply. Overdosage: If you think you have taken too much of this medicine contact a poison control center or emergency room at once. NOTE: This medicine is only for you. Do not share this medicine with others. What if I miss a dose? Keep appointments for follow-up doses. It is important not to miss your dose. Call your care team if you are unable to keep an appointment. What may interact with this medication? Do not take this medication with any of the following: Deferoxamine Dimercaprol Other iron products This medication may also interact with the following: Chloramphenicol Deferasirox This list may not describe all possible interactions. Give your health care provider a list of all the medicines, herbs, non-prescription drugs, or dietary supplements you use. Also tell them if you smoke,  drink alcohol, or use illegal drugs. Some items may interact with your medicine. What should I watch for while using this medication? Visit your care team regularly. Tell your care team if your symptoms do not start to get better or if they get worse. You may need blood work done while you are taking this medication. You may need to follow a special diet. Talk to your care team. Foods that contain iron include: whole grains/cereals, dried fruits, beans, or peas, leafy green vegetables, and organ meats (liver, kidney). What side effects may I notice from receiving this medication? Side effects that you should report to your care team as soon as possible: Allergic reactions--skin rash, itching, hives, swelling of the face, lips, tongue, or throat Low blood pressure--dizziness, feeling faint or lightheaded, blurry vision Shortness of breath Side effects that usually do not require medical attention (report to your care team if they continue or are bothersome): Flushing Headache Joint pain Muscle pain Nausea Pain, redness, or irritation at injection site This list may not describe all possible side effects. Call your doctor for medical advice about side effects. You may report side effects to FDA at 1-800-FDA-1088. Where should I keep my medication? This medication is given in a hospital or clinic. It will not be stored at home. NOTE: This sheet is a summary. It may not cover all possible information. If you have questions about this medicine, talk to your doctor, pharmacist, or health care provider.  2024 Elsevier/Gold Standard (2023-03-26 00:00:00)

## 2023-07-23 ENCOUNTER — Telehealth: Payer: Self-pay | Admitting: Cardiovascular Disease

## 2023-07-23 NOTE — Telephone Encounter (Signed)
Advised patient that the test has not been finalized.  If she does not hear back by Tuesday, to please call again

## 2023-07-23 NOTE — Telephone Encounter (Signed)
Patient would like a call back to discuss CT results

## 2023-07-24 ENCOUNTER — Encounter: Payer: Self-pay | Admitting: Cardiovascular Disease

## 2023-07-29 ENCOUNTER — Inpatient Hospital Stay: Payer: Medicare HMO

## 2023-07-29 ENCOUNTER — Other Ambulatory Visit: Payer: Self-pay | Admitting: Emergency Medicine

## 2023-07-29 VITALS — BP 115/62 | HR 69 | Temp 97.9°F | Resp 19

## 2023-07-29 DIAGNOSIS — I7121 Aneurysm of the ascending aorta, without rupture: Secondary | ICD-10-CM

## 2023-07-29 DIAGNOSIS — D500 Iron deficiency anemia secondary to blood loss (chronic): Secondary | ICD-10-CM | POA: Diagnosis not present

## 2023-07-29 DIAGNOSIS — D509 Iron deficiency anemia, unspecified: Secondary | ICD-10-CM

## 2023-07-29 MED ORDER — SODIUM CHLORIDE 0.9 % IV SOLN
300.0000 mg | Freq: Once | INTRAVENOUS | Status: AC
  Administered 2023-07-29: 300 mg via INTRAVENOUS
  Filled 2023-07-29: qty 300

## 2023-07-29 MED ORDER — SODIUM CHLORIDE 0.9 % IV SOLN: Freq: Once | INTRAVENOUS | Status: AC

## 2023-07-29 NOTE — Patient Instructions (Signed)
Iron Sucrose Injection What is this medication? IRON SUCROSE (EYE ern SOO krose) treats low levels of iron (iron deficiency anemia) in people with kidney disease. Iron is a mineral that plays an important role in making red blood cells, which carry oxygen from your lungs to the rest of your body. This medicine may be used for other purposes; ask your health care provider or pharmacist if you have questions. COMMON BRAND NAME(S): Venofer What should I tell my care team before I take this medication? They need to know if you have any of these conditions: Anemia not caused by low iron levels Heart disease High levels of iron in the blood Kidney disease Liver disease An unusual or allergic reaction to iron, other medications, foods, dyes, or preservatives Pregnant or trying to get pregnant Breastfeeding How should I use this medication? This medication is for infusion into a vein. It is given in a hospital or clinic setting. Talk to your care team about the use of this medication in children. While this medication may be prescribed for children as young as 2 years for selected conditions, precautions do apply. Overdosage: If you think you have taken too much of this medicine contact a poison control center or emergency room at once. NOTE: This medicine is only for you. Do not share this medicine with others. What if I miss a dose? Keep appointments for follow-up doses. It is important not to miss your dose. Call your care team if you are unable to keep an appointment. What may interact with this medication? Do not take this medication with any of the following: Deferoxamine Dimercaprol Other iron products This medication may also interact with the following: Chloramphenicol Deferasirox This list may not describe all possible interactions. Give your health care provider a list of all the medicines, herbs, non-prescription drugs, or dietary supplements you use. Also tell them if you smoke,  drink alcohol, or use illegal drugs. Some items may interact with your medicine. What should I watch for while using this medication? Visit your care team regularly. Tell your care team if your symptoms do not start to get better or if they get worse. You may need blood work done while you are taking this medication. You may need to follow a special diet. Talk to your care team. Foods that contain iron include: whole grains/cereals, dried fruits, beans, or peas, leafy green vegetables, and organ meats (liver, kidney). What side effects may I notice from receiving this medication? Side effects that you should report to your care team as soon as possible: Allergic reactions--skin rash, itching, hives, swelling of the face, lips, tongue, or throat Low blood pressure--dizziness, feeling faint or lightheaded, blurry vision Shortness of breath Side effects that usually do not require medical attention (report to your care team if they continue or are bothersome): Flushing Headache Joint pain Muscle pain Nausea Pain, redness, or irritation at injection site This list may not describe all possible side effects. Call your doctor for medical advice about side effects. You may report side effects to FDA at 1-800-FDA-1088. Where should I keep my medication? This medication is given in a hospital or clinic. It will not be stored at home. NOTE: This sheet is a summary. It may not cover all possible information. If you have questions about this medicine, talk to your doctor, pharmacist, or health care provider.  2024 Elsevier/Gold Standard (2023-03-26 00:00:00)

## 2023-07-30 ENCOUNTER — Ambulatory Visit
Admission: RE | Admit: 2023-07-30 | Discharge: 2023-07-30 | Disposition: A | Discharge status: Home / Self Care | Payer: Medicare HMO | Source: Ambulatory Visit | Attending: Obstetrics and Gynecology | Admitting: Obstetrics and Gynecology

## 2023-07-30 ENCOUNTER — Ambulatory Visit: Payer: Medicare HMO

## 2023-07-30 ENCOUNTER — Other Ambulatory Visit: Payer: Self-pay | Admitting: Obstetrics and Gynecology

## 2023-07-30 DIAGNOSIS — N6310 Unspecified lump in the right breast, unspecified quadrant: Secondary | ICD-10-CM

## 2023-07-30 DIAGNOSIS — R928 Other abnormal and inconclusive findings on diagnostic imaging of breast: Secondary | ICD-10-CM

## 2023-08-03 ENCOUNTER — Ambulatory Visit: Payer: Medicare HMO | Admitting: Gastroenterology

## 2023-08-04 ENCOUNTER — Ambulatory Visit
Admission: RE | Admit: 2023-08-04 | Discharge: 2023-08-04 | Disposition: A | Discharge status: Home / Self Care | Payer: Medicare HMO | Source: Ambulatory Visit | Attending: Obstetrics and Gynecology | Admitting: Obstetrics and Gynecology

## 2023-08-04 ENCOUNTER — Other Ambulatory Visit: Payer: Self-pay | Admitting: Obstetrics and Gynecology

## 2023-08-04 DIAGNOSIS — N6310 Unspecified lump in the right breast, unspecified quadrant: Secondary | ICD-10-CM

## 2023-08-05 ENCOUNTER — Inpatient Hospital Stay: Payer: Medicare HMO | Attending: Hematology & Oncology

## 2023-08-05 VITALS — BP 119/52 | HR 67 | Temp 97.9°F | Resp 17

## 2023-08-05 DIAGNOSIS — K922 Gastrointestinal hemorrhage, unspecified: Secondary | ICD-10-CM | POA: Diagnosis present

## 2023-08-05 DIAGNOSIS — D500 Iron deficiency anemia secondary to blood loss (chronic): Secondary | ICD-10-CM | POA: Diagnosis present

## 2023-08-05 DIAGNOSIS — D509 Iron deficiency anemia, unspecified: Secondary | ICD-10-CM

## 2023-08-05 MED ORDER — SODIUM CHLORIDE 0.9 % IV SOLN
300.0000 mg | Freq: Once | INTRAVENOUS | Status: AC
  Administered 2023-08-05: 300 mg via INTRAVENOUS
  Filled 2023-08-05: qty 300

## 2023-08-05 MED ORDER — SODIUM CHLORIDE 0.9 % IV SOLN: Freq: Once | INTRAVENOUS | Status: AC

## 2023-08-05 NOTE — Progress Notes (Signed)
Pt declined to stay for post infusion observation period. Pt stated she has tolerated medication multiple times prior without difficulty. Pt aware to call clinic with any questions or concerns. Pt verbalized understanding and had no further questions.  ? ?

## 2023-08-05 NOTE — Patient Instructions (Signed)
Iron Sucrose Injection What is this medication? IRON SUCROSE (EYE ern SOO krose) treats low levels of iron (iron deficiency anemia) in people with kidney disease. Iron is a mineral that plays an important role in making red blood cells, which carry oxygen from your lungs to the rest of your body. This medicine may be used for other purposes; ask your health care provider or pharmacist if you have questions. COMMON BRAND NAME(S): Venofer What should I tell my care team before I take this medication? They need to know if you have any of these conditions: Anemia not caused by low iron levels Heart disease High levels of iron in the blood Kidney disease Liver disease An unusual or allergic reaction to iron, other medications, foods, dyes, or preservatives Pregnant or trying to get pregnant Breastfeeding How should I use this medication? This medication is for infusion into a vein. It is given in a hospital or clinic setting. Talk to your care team about the use of this medication in children. While this medication may be prescribed for children as young as 2 years for selected conditions, precautions do apply. Overdosage: If you think you have taken too much of this medicine contact a poison control center or emergency room at once. NOTE: This medicine is only for you. Do not share this medicine with others. What if I miss a dose? Keep appointments for follow-up doses. It is important not to miss your dose. Call your care team if you are unable to keep an appointment. What may interact with this medication? Do not take this medication with any of the following: Deferoxamine Dimercaprol Other iron products This medication may also interact with the following: Chloramphenicol Deferasirox This list may not describe all possible interactions. Give your health care provider a list of all the medicines, herbs, non-prescription drugs, or dietary supplements you use. Also tell them if you smoke,  drink alcohol, or use illegal drugs. Some items may interact with your medicine. What should I watch for while using this medication? Visit your care team regularly. Tell your care team if your symptoms do not start to get better or if they get worse. You may need blood work done while you are taking this medication. You may need to follow a special diet. Talk to your care team. Foods that contain iron include: whole grains/cereals, dried fruits, beans, or peas, leafy green vegetables, and organ meats (liver, kidney). What side effects may I notice from receiving this medication? Side effects that you should report to your care team as soon as possible: Allergic reactions--skin rash, itching, hives, swelling of the face, lips, tongue, or throat Low blood pressure--dizziness, feeling faint or lightheaded, blurry vision Shortness of breath Side effects that usually do not require medical attention (report to your care team if they continue or are bothersome): Flushing Headache Joint pain Muscle pain Nausea Pain, redness, or irritation at injection site This list may not describe all possible side effects. Call your doctor for medical advice about side effects. You may report side effects to FDA at 1-800-FDA-1088. Where should I keep my medication? This medication is given in a hospital or clinic. It will not be stored at home. NOTE: This sheet is a summary. It may not cover all possible information. If you have questions about this medicine, talk to your doctor, pharmacist, or health care provider.  2024 Elsevier/Gold Standard (2023-03-26 00:00:00)

## 2023-08-09 ENCOUNTER — Other Ambulatory Visit: Payer: Self-pay | Admitting: Gastroenterology

## 2023-08-23 NOTE — Telephone Encounter (Signed)
Patient called requesting to speak with someone regarding the Zofran medication.

## 2023-08-23 NOTE — Telephone Encounter (Signed)
Called and spoke to patient. She Is having weekly nausea due to gastroparesis and is taking zofran every few days which helps. Her next appointment is with Dr. Rhea Belton but not until February. Patient requesting refills on zofran 4 mg tablets to get to [facility] appointment. Please advise.

## 2023-09-24 ENCOUNTER — Telehealth: Payer: Self-pay | Admitting: Internal Medicine

## 2023-09-24 NOTE — Telephone Encounter (Signed)
Patient called and stated that she was very concerned with her symptom. Patient stated she had blood in her stomach and she was wanting to speak with a nurse about  getting seen sooner. Please advise.

## 2023-09-24 NOTE — Telephone Encounter (Signed)
Left message for pt to call back  °

## 2023-09-27 NOTE — Telephone Encounter (Signed)
Left message for pt to call back  °

## 2023-09-28 NOTE — Telephone Encounter (Signed)
Left another message for pt to call back. Have not been able to reach pt. Will await further communication from pt.

## 2023-10-11 ENCOUNTER — Encounter: Payer: Self-pay | Admitting: Physician Assistant

## 2023-10-11 ENCOUNTER — Ambulatory Visit (INDEPENDENT_AMBULATORY_CARE_PROVIDER_SITE_OTHER)
Admission: RE | Admit: 2023-10-11 | Discharge: 2023-10-11 | Disposition: A | Discharge status: Home / Self Care | Payer: Medicare HMO | Source: Ambulatory Visit | Attending: Physician Assistant | Admitting: Physician Assistant

## 2023-10-11 ENCOUNTER — Ambulatory Visit: Payer: Medicare HMO | Admitting: Physician Assistant

## 2023-10-11 VITALS — BP 134/50 | HR 68 | Ht 67.0 in | Wt 221.0 lb

## 2023-10-11 DIAGNOSIS — K5904 Chronic idiopathic constipation: Secondary | ICD-10-CM | POA: Diagnosis not present

## 2023-10-11 DIAGNOSIS — Z6834 Body mass index (BMI) 34.0-34.9, adult: Secondary | ICD-10-CM

## 2023-10-11 DIAGNOSIS — R195 Other fecal abnormalities: Secondary | ICD-10-CM | POA: Diagnosis not present

## 2023-10-11 DIAGNOSIS — D509 Iron deficiency anemia, unspecified: Secondary | ICD-10-CM

## 2023-10-11 DIAGNOSIS — K219 Gastro-esophageal reflux disease without esophagitis: Secondary | ICD-10-CM | POA: Diagnosis not present

## 2023-10-11 DIAGNOSIS — K5900 Constipation, unspecified: Secondary | ICD-10-CM | POA: Diagnosis not present

## 2023-10-11 DIAGNOSIS — K3184 Gastroparesis: Secondary | ICD-10-CM

## 2023-10-11 DIAGNOSIS — Z7985 Long-term (current) use of injectable non-insulin antidiabetic drugs: Secondary | ICD-10-CM

## 2023-10-11 DIAGNOSIS — R809 Proteinuria, unspecified: Secondary | ICD-10-CM

## 2023-10-11 DIAGNOSIS — E1143 Type 2 diabetes mellitus with diabetic autonomic (poly)neuropathy: Secondary | ICD-10-CM

## 2023-10-11 MED ORDER — TRULANCE 3 MG PO TABS
3.0000 mg | ORAL_TABLET | Freq: Every day | ORAL | 0 refills | Status: DC
Start: 2023-10-11 — End: 2023-10-15

## 2023-10-11 MED ORDER — ONDANSETRON 8 MG PO TBDP: 8.0000 mg | ORAL_TABLET | Freq: Three times a day (TID) | ORAL | 0 refills | Status: DC | PRN

## 2023-10-11 NOTE — Progress Notes (Unsigned)
10/11/2023 [name] [id number] Mar 23, 1971  Referring provider: Redmond School, NP Primary GI doctor: Dr. Rhea Belton  ASSESSMENT AND PLAN:  52 year old female with IDA, IBS-C possible pelvic floor component/global paresis with + FOBT in hospital and here today in office, negative EGD. Likely FOBT is from hemorrhoids but last colon was at [facility], patient due 2026 and requesting repeat. Will schedule colonoscopy to evaluate, 2 day prep with constipation.  Stop linzess, start on trulance Consider trulance and motegrity.  We have discussed the risks of bleeding, infection, perforation, medication reactions, and remote risk of death associated with colonoscopy. All questions were answered and the patient acknowledges these risk and wishes to proceed.  Iron deficiency anemia, unspecified iron deficiency anemia type Continue follow up with IDA  Gastroesophageal reflux disease without esophagitis Lifestyle changes discussed, avoid NSAIDS, ETOH Weight loss discussed with the patient  Gastroparesis Discussed diet with small, frequent meals, soft diet with the patient.  Can refer to dietician and encouraged weight loss with better control of A1C Will need to optimize bowel regimen.   Type 2 diabetes mellitus with proteinuria (HCC) Discussed GLP1 with the patient, mechanism of action and how this can worsen and/or cause nausea by causing gastroparesis.   Discuss with primary care see about potentially getting off this medication. Gastroparesis diet given to the patient.  Patient should be instructed to hold this medications if dose falls within 2 days of endoscopic procedure, due to increased risk of retained gastric contents.  Severe obesity (BMI 35.0-39.9) with comorbidity (HCC) Morbid obesity  Body mass index is 34.61 kg/m.  -Patient has been advised to make an attempt to improve diet and exercise patterns to aid in weight loss. -Recommended diet heavy in fruits and veggies and low  in animal meats, cheeses, and dairy products, appropriate calorie intake   Patient Care Team: Redmond School, NP as PCP - General Croitoru, Rachelle Hora, MD as PCP - Cardiology (Cardiology) Pollyann Savoy, MD as Consulting Physician (Rheumatology) Loyal Jacobson, MD (Family Medicine)  HISTORY OF PRESENT ILLNESS: 52 y.o. female with a past medical history of Sjogren's, Raynaud's, fibromyalgia, anxiety and depression, hypertension, hyperlipidemia, IBS C on Linzess, type 2 diabetes (on GLP-1), GERD, mild gastroparesis seen on [facility] 2019, IDA followed by hematology [place] and others listed below presents for evaluation of constipation.   Previous colonoscopy at [facility] 06/2020 for screening purposes recall 06/2025. 05/11/2023 seen in the office by Cira Servant, PA for hospital follow-up. 6/29 through 05/02/2023 Hospital admission for melena and thrombosed hemorrhoids.  Thrombosed hemorrhoids treated conservatively.  CT showed large stool burden, no colitis no diverticulitis.  Mild fatty infiltration of pancreas spleen slightly prominent otherwise unremarkable EGD during hospitalization normal esophagus, medium amount of food in stomach consistent with gastroparesis, multiple gastric polyps appearing as fundic gland polyps, normal duodenum, no evidence UGI bleeding, no specimens collected.  Patient was started on Linzess 290 and MiraLAX during hospitalization but continued to have poor response, started on Motegrity, squatty potty.  She had fecal disimpaction 2017 and 2018.  Motegrity helped her nausea/upper AB pain and thinks she helped her gastroparesis but she did not have any Bm's. She states the linzess causes her to have a BM but this is pure water.  Denies fever, chills. No weight loss.  No melena, no hematochezia.  If she stops the linzess she has no BM for days.  Has not tried trulance or amitiza She has iron def and follows with hematology in [place].  Husband  and patient are concerned that  she needs colon.  No fecal incontinence. Small volume urinary incontinence.   She has been on trulicity for 1-2 years.  She is on several psych medications.   She denies NSAID use.  She denies ETOH use.   She denies tobacco use.  She denies drug use.    She  reports that she quit smoking about 30 years ago. Her smoking use included cigarettes. She has never used smokeless tobacco. She reports that she does not drink alcohol and does not use drugs.  PREVIOUS GI WORKUP    Prior to 2019 patient was followed with [facility] gastroenterology, had fecal impaction at that time started on Linzess to 290.  Normal sed rate, CRP. 01/31/15  colonoscopy by Dr. Marina Goodell. Findings of a normal terminal ileum and the ascending colon and otherwise normal exam. Pathology revealed a tubular adenoma and patient was told to follow-up in 5 years. At that time patient was experiencing diarrhea.  10/2017 GES with mild gastroparesis 77% emptied at 4 hours. 11/2017  EGD with Dr. Marina Goodell for nausea, vomiting, decreased p.o. intake showed mild Schatzki ring, small amount of food in the stomach consistent with documented gastroparesis, gastric polyps biopsied, benign. 06/12/2020 Colonoscopy with Britt Bolognese for screening purposes, recall 5 years (06/2025) 02/04/2021 [facility]  with EGD for nausea, vomiting, decreased n.p.o. and mild abdominal discomfort Path showed mild chronic inflammation with focal intestinal metaplasia no evidence of malignancy, fundic gland polyps no evidence of malignancy.  Negative H. Pylori  RELEVANT LABS AND IMAGING  CBC    Component Value Date/Time   WBC 10.2 05/02/2023 0428   RBC 4.03 05/02/2023 0428   HGB 12.1 05/02/2023 1156   HGB 13.4 11/24/2021 1405   HCT 36.5 05/02/2023 1156   PLT 175 05/02/2023 0428   PLT 265 11/24/2021 1405   MCV 89.6 05/02/2023 0428   MCH 30.0 05/02/2023 0428   MCHC 33.5 05/02/2023 0428   RDW 12.7 05/02/2023 0428   LYMPHSABS 2.8 05/02/2023 0428   MONOABS 0.9  05/02/2023 0428   EOSABS 0.2 05/02/2023 0428   BASOSABS 0.0 05/02/2023 0428   Recent Labs    04/30/23 1944 05/01/23 0407 05/01/23 1159 05/01/23 1803 05/02/23 0428 05/02/23 1156  HGB 14.3 12.6 12.5 11.9* 12.1 12.1    CMP     Component Value Date/Time   NA 137 05/02/2023 0428   NA 138 01/09/2022 1524   K 3.7 05/02/2023 0428   CL 107 05/02/2023 0428   CO2 22 05/02/2023 0428   GLUCOSE 83 05/02/2023 0428   BUN 6 05/02/2023 0428   BUN 9 01/09/2022 1524   CREATININE 0.72 05/02/2023 0428   CREATININE 0.73 11/24/2021 1405   CALCIUM 8.7 (L) 05/02/2023 0428   PROT 5.9 (L) 05/02/2023 0428   ALBUMIN 3.3 (L) 05/02/2023 0428   AST 21 05/02/2023 0428   AST 24 11/24/2021 1405   ALT 17 05/02/2023 0428   ALT 29 11/24/2021 1405   ALKPHOS 48 05/02/2023 0428   BILITOT 0.9 05/02/2023 0428   BILITOT 0.4 11/24/2021 1405   GFRNONAA >60 05/02/2023 0428   GFRNONAA >60 11/24/2021 1405   GFRAA >60 04/30/2020 1400      Latest Ref Rng & Units 05/02/2023    4:28 AM 05/01/2023    4:07 AM 04/30/2023    7:44 PM  Hepatic Function  Total Protein 6.5 - 8.1 g/dL 5.9  6.1  7.7   Albumin 3.5 - 5.0 g/dL 3.3  3.3  4.2  AST 15 - 41 U/L 21  21  27    ALT 0 - 44 U/L 17  22  23    Alk Phosphatase 38 - 126 U/L 48  58  77   Total Bilirubin 0.3 - 1.2 mg/dL 0.9  0.7  0.7       Current Medications:   Current Outpatient Medications (Endocrine & Metabolic):    TRULICITY 1.5 MG/0.5ML SOPN, Inject 1.5 mg into the skin every Wednesday.  Current Outpatient Medications (Cardiovascular):    amLODipine (NORVASC) 2.5 MG tablet, Take 1 tablet (2.5 mg total) by mouth daily.   atorvastatin (LIPITOR) 40 MG tablet, Take 40 mg by mouth daily.   losartan (COZAAR) 100 MG tablet, Take 100 mg by mouth daily.   Current Outpatient Medications (Analgesics):    aspirin EC 81 MG tablet, Take 81 mg by mouth daily. Swallow whole.   colchicine 0.6 MG tablet, Take 1.2 mg by mouth daily.   Current Outpatient Medications (Other):     ALPRAZolam (XANAX) 1 MG tablet, Take 1 mg by mouth 4 (four) times daily as needed for anxiety.   buPROPion (WELLBUTRIN XL) 300 MG 24 hr tablet, Take 300 mg by mouth daily.   cevimeline (EVOXAC) 30 MG capsule, Take 30 mg by mouth daily as needed (dry mouth).   DULoxetine (CYMBALTA) 60 MG capsule, Take 60 mg daily by mouth.   hydroxychloroquine (PLAQUENIL) 200 MG tablet, Take 400 mg by mouth daily.   lamoTRIgine (LAMICTAL) 200 MG tablet, Take 400 mg by mouth daily.   lisdexamfetamine (VYVANSE) 70 MG capsule, Take 1 capsule (70 mg total) by mouth every morning.   Lurasidone HCl (LATUDA) 60 MG TABS, Take 60 mg by mouth daily.   ondansetron (ZOFRAN-ODT) 8 MG disintegrating tablet, Take 1 tablet (8 mg total) by mouth every 8 (eight) hours as needed for nausea or vomiting.   Plecanatide (TRULANCE) 3 MG TABS, Take 1 tablet (3 mg total) by mouth daily.   omeprazole (PRILOSEC) 40 MG capsule, Take 1 capsule (40 mg total) by mouth daily.  Medical History:  Past Medical History:  Diagnosis Date   Allergy    Anemia    Anxiety    Depression    Diabetes (HCC)    Elevated LFTs    Fibromyalgia    Hypercholesteremia    Hypertension    Hypomagnesemia    Morbid obesity (HCC)    Seizures (HCC)    Sjoegren syndrome    Allergies:  Allergies  Allergen Reactions   Penicillins Hives   Sulfa Antibiotics Hives   Sulfonamide Derivatives Hives     Surgical History:  She  has a past surgical history that includes Wisdom tooth extraction; Essure tubal ligation; Tonsillectomy; ECT TREATMENT; Upper gastrointestinal endoscopy; Colon surgery; and Esophagogastroduodenoscopy (egd) with propofol (N/A, 05/01/2023). Family History:  Her family history includes Breast cancer in her paternal aunt and paternal aunt; Diabetes in her father and mother; Lupus in her paternal aunt; Multiple sclerosis in her mother; Prostate cancer in her father; Psoriasis in her father; Rheum arthritis in her paternal aunt.  REVIEW OF  SYSTEMS  : All other systems reviewed and negative except where noted in the History of Present Illness.  PHYSICAL EXAM: BP (!) 134/50   Pulse 68   Ht 5\' 7"  (1.702 m)   Wt 221 lb (100.2 kg)   SpO2 94%   BMI 34.61 kg/m  General Appearance: Well nourished, in no apparent distress. Head:   Normocephalic and atraumatic. Eyes:  sclerae anicteric,conjunctive pink  Respiratory: Respiratory effort normal, BS equal bilaterally without rales, rhonchi, wheezing. Cardio: RRR with no MRGs. Peripheral pulses intact.  Abdomen: Soft,  Obese ,active bowel sounds. mild tenderness in the epigastrium and in the lower abdomen. Without guarding and Without rebound. No masses. Rectal: Normal external rectal exam, normal rectal tone, appreciated internal hemorrhoids, non-tender, no masses, scant  soft brown stool, hemoccult Positive  Musculoskeletal: Full ROM, Normal gait. Without edema. Skin:  Dry and intact without significant lesions or rashes Neuro: Alert and  oriented x4;  No focal deficits. Psych:  Cooperative. Normal mood and affect.    Doree Albee, PA-C 3:50 PM

## 2023-10-11 NOTE — Patient Instructions (Addendum)
Your provider has requested that you have an abdominal x ray before leaving today. Please go to the basement floor to our Radiology department for the test.  Can try trulance rather than linzess Can take motegrity prior to colon to help with prep Can take zofran as needed  Toileting tips to help with your constipation - Drink at least 64-80 ounces of water/liquid per day. - Establish a time to try to move your bowels every day.  For many people, this is after a cup of coffee or after a meal such as breakfast. - Sit all of the way back on the toilet keeping your back fairly straight and while sitting up, try to rest the tops of your forearms on your upper thighs.   - Raising your feet with a step stool/squatty potty can be helpful to improve the angle that allows your stool to pass through the rectum. - Relax the rectum feeling it bulge toward the toilet water.  If you feel your rectum raising toward your body, you are contracting rather than relaxing. - Breathe in and slowly exhale. "Belly breath" by expanding your belly towards your belly button. Keep belly expanded as you gently direct pressure down and back to the anus.  A low pitched GRRR sound can assist with increasing intra-abdominal pressure.  (Can also trying to blow on a pinwheel and make it move, this helps with the same belly breathing) - Repeat 3-4 times. If unsuccessful, contract the pelvic floor to restore normal tone and get off the toilet.  Avoid excessive straining. - To reduce excessive wiping by teaching your anus to normally contract, place hands on outer aspect of knees and resist knee movement outward.  Hold 5-10 second then place hands just inside of knees and resist inward movement of knees.  Hold 5 seconds.  Repeat a few times each way.  Go to the ER if unable to pass gas, severe AB pain, unable to hold down food, any shortness of breath of chest pain.  Here some information about pelvic floor dysfunction. This may be  contributing to some of your symptoms. We will continue with our evaluation but I do want you to consider adding on fiber supplement with low-dose MiraLAX daily. We could also refer to pelvic floor physical therapy.   Pelvic Floor Dysfunction, Female Pelvic floor dysfunction (PFD) is a condition that results when the group of muscles and connective tissues that support the organs in the pelvis (pelvic floor muscles) do not work well. These muscles and their connections form a sling that supports the colon and bladder. In women, they also support the uterus. PFD causes pelvic floor muscles to be too weak, too tight, or both. In PFD, muscle movements are not coordinated. This may cause bowel or bladder problems. It may also cause pain. What are the causes? This condition may be caused by an injury to the pelvic area or by a weakening of pelvic muscles. This often results from pregnancy and childbirth or other types of strain. In many cases, the exact cause is not known. What increases the risk? The following factors may make you more likely to develop this condition: Having chronic bladder tissue inflammation (interstitial cystitis). Being an older person. Being overweight. History of radiation treatment for cancer in the pelvic region. Previous pelvic surgery, such as removal of the uterus (hysterectomy). What are the signs or symptoms? Symptoms of this condition vary and may include: Bladder symptoms, such as: Trouble starting urination and emptying the bladder.  Frequent urinary tract infections. Leaking urine when coughing, laughing, or exercising (stress incontinence). Having to pass urine urgently or frequently. Pain when passing urine. Bowel symptoms, such as: Constipation. Urgent or frequent bowel movements. Incomplete bowel movements. Painful bowel movements. Leaking stool or gas. Unexplained genital or rectal pain. Genital or rectal muscle spasms. Low back pain. Other  symptoms may include: A heavy, full, or aching feeling in the vagina. A bulge that protrudes into the vagina. Pain during or after sex. How is this diagnosed? This condition may be diagnosed based on: Your symptoms and medical history. A physical exam. During the exam, your health care provider may check your pelvic muscles for tightness, spasm, pain, or weakness. This may include a rectal exam and a pelvic exam. In some cases, you may have diagnostic tests, such as: Electrical muscle function tests. Urine flow testing. X-ray tests of bowel function. Ultrasound of the pelvic organs. How is this treated? Treatment for this condition depends on the symptoms. Treatment options include: Physical therapy. This may include Kegel exercises to help relax or strengthen the pelvic floor muscles. Biofeedback. This type of therapy provides feedback on how tight your pelvic floor muscles are so that you can learn to control them. Internal or external massage therapy. A treatment that involves electrical stimulation of the pelvic floor muscles to help control pain (transcutaneous electrical nerve stimulation, or TENS). Sound wave therapy (ultrasound) to reduce muscle spasms. Medicines, such as: Muscle relaxants. Bladder control medicines. Surgery to reconstruct or support pelvic floor muscles may be an option if other treatments do not help. Follow these instructions at home: Activity Do your usual activities as told by your health care provider. Ask your health care provider if you should modify any activities. Do pelvic floor strengthening or relaxing exercises at home as told by your physical therapist. Lifestyle Maintain a healthy weight. Eat foods that are high in fiber, such as beans, whole grains, and fresh fruits and vegetables. Limit foods that are high in fat and processed sugars, such as fried or sweet foods. Manage stress with relaxation techniques such as yoga or meditation. General  instructions If you have problems with leakage: Use absorbable pads or wear padded underwear. Wash frequently with mild soap. Keep your genital and anal area as clean and dry as possible. Ask your health care provider if you should try a barrier cream to prevent skin irritation. Take warm baths to relieve pelvic muscle tension or spasms. Take over-the-counter and prescription medicines only as told by your health care provider. Keep all follow-up visits. How is this prevented? The cause of PFD is not always known, but there are a few things you can do to reduce the risk of developing this condition, including: Staying at a healthy weight. Getting regular exercise. Managing stress. Contact a health care provider if: Your symptoms are not improving with home care. You have signs or symptoms of PFD that get worse at home. You develop new signs or symptoms. You have signs of a urinary tract infection, such as: Fever. Chills. Increased urinary frequency. A burning feeling when urinating. You have not had a bowel movement in 3 days (constipation). Summary Pelvic floor dysfunction results when the muscles and connective tissues in your pelvic floor do not work well. These muscles and their connections form a sling that supports your colon and bladder. In women, they also support the uterus. PFD may be caused by an injury to the pelvic area or by a weakening of pelvic muscles. PFD  causes pelvic floor muscles to be too weak, too tight, or a combination of both. Symptoms may vary from person to person. In most cases, PFD can be treated with physical therapies and medicines. Surgery may be an option if other treatments do not help. This information is not intended to replace advice given to you by your health care provider. Make sure you discuss any questions you have with your health care provider. Document Revised: 02/26/2021 Document Reviewed: 02/26/2021 Elsevier Patient Education  2022  [employer].

## 2023-10-12 ENCOUNTER — Other Ambulatory Visit: Payer: Self-pay

## 2023-10-12 DIAGNOSIS — K5900 Constipation, unspecified: Secondary | ICD-10-CM

## 2023-10-15 ENCOUNTER — Other Ambulatory Visit: Payer: Self-pay | Admitting: *Deleted

## 2023-10-15 MED ORDER — TRULANCE 3 MG PO TABS: 3.0000 mg | ORAL_TABLET | Freq: Every day | ORAL | 1 refills | Status: DC

## 2023-10-18 ENCOUNTER — Telehealth: Payer: Self-pay

## 2023-10-18 NOTE — Telephone Encounter (Signed)
Pharmacy Patient Advocate Encounter   Received notification from CoverMyMeds that prior authorization for Trulance 3 mg tablets is required/requested.   Insurance verification completed.   The patient is insured through [employer] .   Per test claim: PA required; PA started via CoverMyMeds. KEY [id number] . Waiting for clinical questions to populate.

## 2023-10-19 ENCOUNTER — Other Ambulatory Visit: Payer: Self-pay | Admitting: *Deleted

## 2023-10-19 ENCOUNTER — Telehealth: Payer: Self-pay | Admitting: Physician Assistant

## 2023-10-19 DIAGNOSIS — D509 Iron deficiency anemia, unspecified: Secondary | ICD-10-CM

## 2023-10-19 DIAGNOSIS — D500 Iron deficiency anemia secondary to blood loss (chronic): Secondary | ICD-10-CM

## 2023-10-19 NOTE — Telephone Encounter (Signed)
Inbound call from patient, would like to discuss different prep options for upcoming procedure. She states she is unsure of how to take the prep medications and has a couple questions. Please advise.

## 2023-10-20 ENCOUNTER — Encounter: Payer: Self-pay | Admitting: Medical Oncology

## 2023-10-20 ENCOUNTER — Inpatient Hospital Stay (HOSPITAL_BASED_OUTPATIENT_CLINIC_OR_DEPARTMENT_OTHER): Payer: Medicare HMO | Admitting: Medical Oncology

## 2023-10-20 ENCOUNTER — Inpatient Hospital Stay: Payer: Medicare HMO | Attending: Hematology & Oncology

## 2023-10-20 VITALS — BP 99/43 | HR 65 | Temp 98.2°F | Resp 18 | Ht 67.0 in | Wt 222.0 lb

## 2023-10-20 DIAGNOSIS — D509 Iron deficiency anemia, unspecified: Secondary | ICD-10-CM

## 2023-10-20 DIAGNOSIS — D500 Iron deficiency anemia secondary to blood loss (chronic): Secondary | ICD-10-CM

## 2023-10-20 DIAGNOSIS — K922 Gastrointestinal hemorrhage, unspecified: Secondary | ICD-10-CM | POA: Diagnosis present

## 2023-10-20 LAB — CMP (CANCER CENTER ONLY)
ALT: 23 U/L (ref 0–44)
AST: 19 U/L (ref 15–41)
Albumin: 3.9 g/dL (ref 3.5–5.0)
Alkaline Phosphatase: 62 U/L (ref 38–126)
Anion gap: 9 (ref 5–15)
BUN: 12 mg/dL (ref 6–20)
CO2: 28 mmol/L (ref 22–32)
Calcium: 8.8 mg/dL — ABNORMAL LOW (ref 8.9–10.3)
Chloride: 103 mmol/L (ref 98–111)
Creatinine: 0.76 mg/dL (ref 0.44–1.00)
GFR, Estimated: >60 mL/min (ref >60)
Glucose, Bld: 136 mg/dL — ABNORMAL HIGH (ref 70–99)
Potassium: 4 mmol/L (ref 3.5–5.1)
Sodium: 140 mmol/L (ref 135–145)
Total Bilirubin: 0.4 mg/dL (ref <1.2)
Total Protein: 6.2 g/dL — ABNORMAL LOW (ref 6.5–8.1)

## 2023-10-20 LAB — FERRITIN: Ferritin: 53 ng/mL (ref 11–307)

## 2023-10-20 LAB — RETICULOCYTES
Immature Retic Fract: 5.8 % (ref 2.3–15.9)
RBC.: 4.6 MIL/uL (ref 3.87–5.11)
Retic Count, Absolute: 68.1 10*3/uL (ref 19.0–186.0)
Retic Ct Pct: 1.5 % (ref 0.4–3.1)

## 2023-10-20 LAB — CBC WITH DIFFERENTIAL (CANCER CENTER ONLY)
Abs Immature Granulocytes: 0.02 10*3/uL (ref 0.00–0.07)
Basophils Absolute: 0 10*3/uL (ref 0.0–0.1)
Basophils Relative: 0 %
Eosinophils Absolute: 0.1 10*3/uL (ref 0.0–0.5)
Eosinophils Relative: 2 %
HCT: 41.8 % (ref 36.0–46.0)
Hemoglobin: 13.6 g/dL (ref 12.0–15.0)
Immature Granulocytes: 0 %
Lymphocytes Relative: 31 %
Lymphs Abs: 2.1 10*3/uL (ref 0.7–4.0)
MCH: 29.3 pg (ref 26.0–34.0)
MCHC: 32.5 g/dL (ref 30.0–36.0)
MCV: 90.1 fL (ref 80.0–100.0)
Monocytes Absolute: 0.8 10*3/uL (ref 0.1–1.0)
Monocytes Relative: 11 %
Neutro Abs: 3.7 10*3/uL (ref 1.7–7.7)
Neutrophils Relative %: 56 %
Platelet Count: 228 10*3/uL (ref 150–400)
RBC: 4.64 MIL/uL (ref 3.87–5.11)
RDW: 13.6 % (ref 11.5–15.5)
WBC Count: 6.8 10*3/uL (ref 4.0–10.5)
nRBC: 0 % (ref 0.0–0.2)

## 2023-10-20 NOTE — Progress Notes (Signed)
Hematology and Oncology Follow Up Visit  [name] [id number] Oct 15, 1971 52 y.o. 10/20/2023   Principle Diagnosis:  Iron deficiency anemia - GI loss   Current Therapy: IV iron as indicated   Work Up:  01/31/15  colonoscopy by Dr. Marina Goodell. Findings of a normal terminal ileum and the ascending colon and otherwise normal exam. Pathology revealed a tubular adenoma and patient was told to follow-up in 5 years. At that time patient was experiencing diarrhea.  11/2017  EGD with Dr. Marina Goodell for nausea, vomiting, decreased p.o. intake showed mild Schatzki ring, small amount of food in the stomach consistent with documented gastroparesis, gastric polyps biopsied, benign. 06/12/2020 Colonoscopy with Britt Bolognese for screening purposes, recall 5 years (06/2025) 02/04/2021 [facility]  with EGD for nausea, vomiting, decreased n.p.o. and mild abdominal discomfort Path showed mild chronic inflammation with focal intestinal metaplasia no evidence of malignancy, fundic gland polyps no evidence of malignancy.  Negative H. Pylori 05/01/2023 Endoscopy with [facility]. Benign polyps without bleeding for melena   Interim History:  [name] is here today for follow-up.   [name] is doing well overall.  [name] believes that [name] is almost in menopause as [name] menstrual cycles are lighter and longer apart. No chance for pregnancy.  There has been no bleeding to [name] knowledge: denies epistaxis, gingivitis, hemoptysis, hematemesis, hematuria, melena, excessive bruising, blood donation.  Not taking oral iron due to severe constipation with oral iron No abnormal bruising, no petechiae.  Fever, chills, n/v, cough, rash, dizziness, SOB, chest pain, palpitations, abdominal pain or changes in bowel or bladder habits.  No swelling, tenderness, numbness or tingling in [name] extremities.  No falls or syncope.  [name] has maintained a good appetite and is staying well hydrated.   Wt Readings from Last 3 Encounters:  10/20/23 222 lb (100.7 kg)   10/11/23 221 lb (100.2 kg)  07/22/23 230 lb 0.6 oz (104.3 kg)   ECOG Performance Status: 1 - Symptomatic but completely ambulatory  Medications:  Allergies as of 10/20/2023       Reactions   Penicillins Hives   Sulfa Antibiotics Hives   Sulfonamide Derivatives Hives        Medication List        Accurate as of October 20, 2023  2:51 PM. If you have any questions, ask your nurse or doctor.          ALPRAZolam 1 MG tablet Commonly known as: XANAX Take 1 mg by mouth 4 (four) times daily as needed for anxiety.   amLODipine 2.5 MG tablet Commonly known as: NORVASC Take 1 tablet (2.5 mg total) by mouth daily.   aspirin EC 81 MG tablet Take 81 mg by mouth daily. Swallow whole.   atorvastatin 40 MG tablet Commonly known as: LIPITOR Take 40 mg by mouth daily.   buPROPion 300 MG 24 hr tablet Commonly known as: WELLBUTRIN XL Take 300 mg by mouth daily.   cevimeline 30 MG capsule Commonly known as: EVOXAC Take 30 mg by mouth daily as needed (dry mouth).   colchicine 0.6 MG tablet Take 1.2 mg by mouth daily.   DULoxetine 60 MG capsule Commonly known as: CYMBALTA Take 60 mg daily by mouth.   hydroxychloroquine 200 MG tablet Commonly known as: PLAQUENIL Take 400 mg by mouth daily.   lamoTRIgine 200 MG tablet Commonly known as: LAMICTAL Take 400 mg by mouth daily.   Latuda 60 MG Tabs Generic drug: Lurasidone HCl Take 60 mg by mouth daily.   lisdexamfetamine 70 MG  capsule Commonly known as: Vyvanse Take 1 capsule (70 mg total) by mouth every morning.   losartan 100 MG tablet Commonly known as: COZAAR Take 100 mg by mouth daily.   omeprazole 40 MG capsule Commonly known as: PRILOSEC Take 1 capsule (40 mg total) by mouth daily.   ondansetron 8 MG disintegrating tablet Commonly known as: ZOFRAN-ODT Take 1 tablet (8 mg total) by mouth every 8 (eight) hours as needed for nausea or vomiting.   Trulance 3 MG Tabs Generic drug: Plecanatide Take 1 tablet  (3 mg total) by mouth daily.   Trulicity 1.5 MG/0.5ML Soaj Generic drug: Dulaglutide Inject 1.5 mg into the skin every Wednesday.        Allergies:  Allergies  Allergen Reactions   Penicillins Hives   Sulfa Antibiotics Hives   Sulfonamide Derivatives Hives    Past Medical History, Surgical history, Social history, and Family History were reviewed and updated.  Review of Systems: All other 10 point review of systems is negative.   Physical Exam:  height is 5\' 7"  (1.702 m) and weight is 222 lb (100.7 kg). [name] oral temperature is 98.2 F (36.8 C). [name] blood pressure is 99/43 (abnormal) and [name] pulse is 65. [name] respiration is 18 and oxygen saturation is 100%.   Wt Readings from Last 3 Encounters:  10/20/23 222 lb (100.7 kg)  10/11/23 221 lb (100.2 kg)  07/22/23 230 lb 0.6 oz (104.3 kg)    Ocular: Sclerae unicteric, pupils equal, round and reactive to light Ear-nose-throat: Oropharynx clear, dentition fair Lymphatic: No cervical or supraclavicular adenopathy Lungs no rales or rhonchi, good excursion bilaterally Heart regular rate and rhythm, no murmur appreciated Abd soft, nontender, positive bowel sounds MSK no focal spinal tenderness, no joint edema Neuro: non-focal, well-oriented, appropriate affect  Lab Results  Component Value Date   WBC 6.8 10/20/2023   HGB 13.6 10/20/2023   HCT 41.8 10/20/2023   MCV 90.1 10/20/2023   PLT 228 10/20/2023   Lab Results  Component Value Date   FERRITIN 31 11/24/2021   IRON 73 11/24/2021   TIBC 335 11/24/2021   UIBC 262 11/24/2021   IRONPCTSAT 22 11/24/2021   Lab Results  Component Value Date   RETICCTPCT 1.5 10/20/2023   RBC 4.60 10/20/2023   No results found for: "KPAFRELGTCHN", "LAMBDASER", "KAPLAMBRATIO" Lab Results  Component Value Date   IGGSERUM 1,134 05/19/2021   IGA 154 05/19/2021   IGMSERUM 124 05/19/2021   No results found for: "TOTALPROTELP", "ALBUMINELP", "A1GS", "A2GS", "BETS", "BETA2SER", "GAMS",  "MSPIKE", "SPEI"   Chemistry      Component Value Date/Time   NA 140 10/20/2023 1402   NA 138 01/09/2022 1524   K 4.0 10/20/2023 1402   CL 103 10/20/2023 1402   CO2 28 10/20/2023 1402   BUN 12 10/20/2023 1402   BUN 9 01/09/2022 1524   CREATININE 0.76 10/20/2023 1402      Component Value Date/Time   CALCIUM 8.8 (L) 10/20/2023 1402   ALKPHOS 62 10/20/2023 1402   AST 19 10/20/2023 1402   ALT 23 10/20/2023 1402   BILITOT 0.4 10/20/2023 1402     Encounter Diagnosis  Name Primary?   Iron deficiency anemia due to chronic blood loss Yes    Impression and Plan: [name] is a very pleasant 52 yo caucasian female with history iron deficiency anemia secondary to GI bleed as well as several autoimmune issues including Sjogren's, fibromyalgia and type II diabetes. See GI work up above.   Today CBC shows  a 13.6.  No evidence of known bleeding.  [name] has colonoscopy scheduled for Jan 6th. Capsule study recommended if normal.  Iron studies pending.  Hydration with water for [name] softer BP today. Fortunately [name] is asymptomatic  RTC 3 months APP, labs ( CBC w/, CMP, ferritin, iron, retic)-[place]   Rushie Chestnut, PA-C 12/18/20242:51 PM

## 2023-10-21 LAB — IRON AND IRON BINDING CAPACITY (CC-WL,HP ONLY)
Iron: 69 ug/dL (ref 28–170)
Saturation Ratios: 23 % (ref 10.4–31.8)
TIBC: 295 ug/dL (ref 250–450)
UIBC: 226 ug/dL (ref 148–442)

## 2023-10-22 NOTE — Progress Notes (Signed)
Addendum: Reviewed and agree with assessment and management plan. Kadijah Shamoon M, MD  

## 2023-11-03 ENCOUNTER — Encounter: Payer: Self-pay | Admitting: Hematology & Oncology

## 2023-11-04 ENCOUNTER — Encounter: Payer: Self-pay | Admitting: Hematology & Oncology

## 2023-11-04 ENCOUNTER — Telehealth: Payer: Self-pay

## 2023-11-04 NOTE — Telephone Encounter (Signed)
 Pt called earlier today stating she was confused about her prep instructions, new copy was sent for pt to do a 2 day prep with miralax  and suprep. Pt states she was told she could do a 2 day prep with miralax  only and is begging to do this. Please advise if pt can do this.

## 2023-11-04 NOTE — Telephone Encounter (Signed)
 New prep instructions sent to pt for miralax 2 day prep with dulcolax.

## 2023-11-04 NOTE — Telephone Encounter (Signed)
 Patient will need 2 day prep and likely dulcolax as well.  Can do miralax if patient will not do other prep.

## 2023-11-08 ENCOUNTER — Encounter: Payer: Self-pay | Admitting: Internal Medicine

## 2023-11-08 ENCOUNTER — Ambulatory Visit: Payer: Medicare Other | Admitting: Internal Medicine

## 2023-11-08 VITALS — BP 126/66 | HR 59 | Temp 97.7°F | Resp 12 | Ht 67.0 in | Wt 221.0 lb

## 2023-11-08 DIAGNOSIS — Z860101 Personal history of adenomatous and serrated colon polyps: Secondary | ICD-10-CM | POA: Diagnosis not present

## 2023-11-08 DIAGNOSIS — D122 Benign neoplasm of ascending colon: Secondary | ICD-10-CM

## 2023-11-08 DIAGNOSIS — K635 Polyp of colon: Secondary | ICD-10-CM

## 2023-11-08 DIAGNOSIS — D128 Benign neoplasm of rectum: Secondary | ICD-10-CM

## 2023-11-08 DIAGNOSIS — D123 Benign neoplasm of transverse colon: Secondary | ICD-10-CM | POA: Diagnosis not present

## 2023-11-08 DIAGNOSIS — K648 Other hemorrhoids: Secondary | ICD-10-CM | POA: Diagnosis not present

## 2023-11-08 DIAGNOSIS — D509 Iron deficiency anemia, unspecified: Secondary | ICD-10-CM

## 2023-11-08 MED ORDER — SODIUM CHLORIDE 0.9 % IV SOLN: 500.0000 mL | Freq: Once | INTRAVENOUS | Status: DC

## 2023-11-08 NOTE — Op Note (Signed)
 [facility] Patient Name: [name] Procedure Date: 11/08/2023 2:15 PM MRN: [id number] Endoscopist: Gordy CHRISTELLA Starch , MD, [id number] Age: 53 Referring MD:  Date of Birth: [birth year] Gender: Female Account #: [id number] Procedure:                Colonoscopy Indications:              Iron  deficiency anemia, hx of TA x 1 in 2016, last                            exam 2021 at [facility] Medicines:                Monitored Anesthesia Care Procedure:                Pre-Anesthesia Assessment:                           - Prior to the procedure, a History and Physical                            was performed, and patient medications and                            allergies were reviewed. The patient's tolerance of                            previous anesthesia was also reviewed. The risks                            and benefits of the procedure and the sedation                            options and risks were discussed with the patient.                            All questions were answered, and informed consent                            was obtained. Prior Anticoagulants: The patient has                            taken no anticoagulant or antiplatelet agents. ASA                            Grade Assessment: III - A patient with severe                            systemic disease. After reviewing the risks and                            benefits, the patient was deemed in satisfactory                            condition to undergo the procedure.  After obtaining informed consent, the colonoscope                            was passed under direct vision. Throughout the                            procedure, the patient's blood pressure, pulse, and                            oxygen saturations were monitored continuously. The                            CF [id number] #[id number] was introduced through the anus                            and advanced to the terminal ileum. The  colonoscopy                            was performed without difficulty. The patient                            tolerated the procedure well. The quality of the                            bowel preparation was good. The terminal ileum,                            ileocecal valve, appendiceal orifice, and rectum                            were photographed. Scope In: 2:29:24 PM Scope Out: 2:49:37 PM Scope Withdrawal Time: 0 hours 13 minutes 30 seconds  Total Procedure Duration: 0 hours 20 minutes 13 seconds  Findings:                 The digital rectal exam was normal.                           The terminal ileum appeared normal.                           Two sessile polyps were found in the ascending                            colon. The polyps were 2 to 3 mm in size. These                            polyps were removed with a cold biopsy forceps.                            Resection and retrieval were complete.                           A 5 mm polyp was found in the hepatic flexure. The  polyp was sessile. The polyp was removed with a                            cold snare. Resection and retrieval were complete.                           A 5 mm polyp was found in the rectum. The polyp was                            sessile. The polyp was removed with a cold snare.                            Resection and retrieval were complete.                           Internal hemorrhoids were found during                            retroflexion. The hemorrhoids were medium-sized. Complications:            No immediate complications. Estimated Blood Loss:     Estimated blood loss was minimal. Impression:               - The examined portion of the ileum was normal.                           - Two 2 to 3 mm polyps in the ascending colon,                            removed with a cold biopsy forceps. Resected and                            retrieved.                           -  One 5 mm polyp at the hepatic flexure, removed                            with a cold snare. Resected and retrieved.                           - One 5 mm polyp in the rectum, removed with a cold                            snare. Resected and retrieved.                           - Internal hemorrhoids. Recommendation:           - Patient has a contact number available for                            emergencies. The signs and symptoms of potential  delayed complications were discussed with the                            patient. Return to normal activities tomorrow.                            Written discharge instructions were provided to the                            patient.                           - Resume previous diet.                           - Continue present medications. Unable to get                            Trulance , will give samples and work on prior                            authorization.                           - Await pathology results.                           - Repeat colonoscopy is recommended for                            surveillance. The colonoscopy date will be                            determined after pathology results from today's                            exam become available for review. Gordy CHRISTELLA Starch, MD 11/08/2023 2:54:05 PM This report has been signed electronically.

## 2023-11-08 NOTE — Progress Notes (Signed)
 Report to PACU, RN, vss, BBS= Clear.

## 2023-11-08 NOTE — Progress Notes (Signed)
 Called to room to assist during endoscopic procedure.  Patient ID and intended procedure confirmed with present staff. Received instructions for my participation in the procedure from the performing physician.

## 2023-11-08 NOTE — Patient Instructions (Signed)
 Thank you for letting us  care for your healthcare needs! Please see handouts regarding Polyps and Hemorrhoids. Start Trulance  tomorrow.   YOU HAD AN ENDOSCOPIC PROCEDURE TODAY AT THE [facility]:   Refer to the procedure report that was given to you for any specific questions about what was found during the examination.  If the procedure report does not answer your questions, please call your gastroenterologist to clarify.  If you requested that your care partner not be given the details of your procedure findings, then the procedure report has been included in a sealed envelope for you to review at your convenience later.  YOU SHOULD EXPECT: Some feelings of bloating in the abdomen. Passage of more gas than usual.  Walking can help get rid of the air that was put into your GI tract during the procedure and reduce the bloating. If you had a lower endoscopy (such as a colonoscopy or flexible sigmoidoscopy) you may notice spotting of blood in your stool or on the toilet paper. If you underwent a bowel prep for your procedure, you may not have a normal bowel movement for a few days.  Please Note:  You might notice some irritation and congestion in your nose or some drainage.  This is from the oxygen used during your procedure.  There is no need for concern and it should clear up in a day or so.  SYMPTOMS TO REPORT IMMEDIATELY:  Following lower endoscopy (colonoscopy or flexible sigmoidoscopy):  Excessive amounts of blood in the stool  Significant tenderness or worsening of abdominal pains  Swelling of the abdomen that is new, acute  Fever of 100F or higher  For urgent or emergent issues, a gastroenterologist can be reached at any hour by calling [phone]. Do not use MyChart messaging for urgent concerns.    DIET:  We do recommend a small meal at first, but then you may proceed to your regular diet.  Drink plenty of fluids but you should avoid alcoholic beverages for 24  hours.  ACTIVITY:  You should plan to take it easy for the rest of today and you should NOT DRIVE or use heavy machinery until tomorrow (because of the sedation medicines used during the test).    FOLLOW UP: Our staff will call the number listed on your records the next business day following your procedure.  We will call around 7:15- 8:00 am to check on you and address any questions or concerns that you may have regarding the information given to you following your procedure. If we do not reach you, we will leave a message.     If any biopsies were taken you will be contacted by phone or by letter within the next 1-3 weeks.  Please call us  at [phone] if you have not heard about the biopsies in 3 weeks.    SIGNATURES/CONFIDENTIALITY: You and/or your care partner have signed paperwork which will be entered into your electronic medical record.  These signatures attest to the fact that that the information above on your After Visit Summary has been reviewed and is understood.  Full responsibility of the confidentiality of this discharge information lies with you and/or your care-partner.

## 2023-11-08 NOTE — Progress Notes (Signed)
 GASTROENTEROLOGY PROCEDURE H&P NOTE   Primary Care Physician: Minta Jon BROCKS, NP    Reason for Procedure:   Hx of polyps and IDA  Plan:    colonoscopy  Patient is appropriate for endoscopic procedure(s) in the ambulatory ([facility]) setting.  The nature of the procedure, as well as the risks, benefits, and alternatives were carefully and thoroughly reviewed with the patient. Ample time for discussion and questions allowed. The patient understood, was satisfied, and agreed to proceed.     HPI: [name] is a 53 y.o. female who presents for colonoscopy.  Medical history as below.  Tolerated the prep.  No recent chest pain or shortness of breath.  No abdominal pain today.  Past Medical History:  Diagnosis Date   Allergy    Anemia    Anxiety    Depression    Diabetes (HCC)    Elevated LFTs    Fibromyalgia    GERD (gastroesophageal reflux disease)    Hypercholesteremia    Hypertension    Hypomagnesemia    Morbid obesity (HCC)    Seizures (HCC)    Sjoegren syndrome     Past Surgical History:  Procedure Laterality Date   COLONOSCOPY     ECT TREATMENT     ESOPHAGOGASTRODUODENOSCOPY (EGD) WITH PROPOFOL  N/A 05/01/2023   Procedure: ESOPHAGOGASTRODUODENOSCOPY (EGD) WITH PROPOFOL ;  Surgeon: Albertus Gordy HERO, MD;  Location: [facility];  Service: Gastroenterology;  Laterality: N/A;   ESSURE TUBAL LIGATION     TONSILLECTOMY     UPPER GASTROINTESTINAL ENDOSCOPY     WISDOM TOOTH EXTRACTION      Prior to Admission medications   Medication Sig Start Date End Date Taking? Authorizing Provider  ALPRAZolam (XANAX) 1 MG tablet Take 1 mg by mouth 4 (four) times daily as needed for anxiety. 03/19/22  Yes [provider]  amLODipine  (NORVASC ) 2.5 MG tablet Take 1 tablet (2.5 mg total) by mouth daily. 03/02/23  Yes Croitoru, Mihai, MD  aspirin EC 81 MG tablet Take 81 mg by mouth daily. Swallow whole.   Yes [provider]  atorvastatin (LIPITOR) 40 MG tablet Take 40 mg by  mouth daily.   Yes [provider]  buPROPion  (WELLBUTRIN  XL) 300 MG 24 hr tablet Take 300 mg by mouth daily. 03/19/22  Yes [provider]  colchicine 0.6 MG tablet Take 1.2 mg by mouth daily. 08/16/20  Yes [provider]  DULoxetine  (CYMBALTA ) 60 MG capsule Take 60 mg daily by mouth.   Yes [provider]  hydroxychloroquine  (PLAQUENIL ) 200 MG tablet Take 400 mg by mouth daily.   Yes [provider]  lamoTRIgine  (LAMICTAL ) 200 MG tablet Take 400 mg by mouth daily. 04/11/20  Yes [provider]  lisdexamfetamine (VYVANSE ) 70 MG capsule Take 1 capsule (70 mg total) by mouth every morning. 09/29/22  Yes   losartan  (COZAAR ) 100 MG tablet Take 100 mg by mouth daily. 05/31/20  Yes [provider]  Lurasidone  HCl (LATUDA ) 60 MG TABS Take 60 mg by mouth daily. 10/03/20  Yes [provider]  omeprazole  (PRILOSEC) 40 MG capsule Take 1 capsule (40 mg total) by mouth daily. 10/19/17  Yes Abran Norleen SAILOR, MD  ondansetron  (ZOFRAN -ODT) 8 MG disintegrating tablet Take 1 tablet (8 mg total) by mouth every 8 (eight) hours as needed for nausea or vomiting. 10/11/23  Yes Craig Palma R, PA-C  cevimeline  (EVOXAC ) 30 MG capsule Take 30 mg by mouth daily as needed (dry mouth). 01/08/22   [provider]  TRULICITY 1.5 MG/0.5ML SOPN Inject 1.5 mg into the skin every Wednesday. 03/04/22   [provider]    Current Outpatient Medications  Medication Sig Dispense Refill   ALPRAZolam (XANAX) 1 MG tablet Take 1 mg by mouth 4 (four) times daily as needed for anxiety.     amLODipine  (NORVASC ) 2.5 MG tablet Take 1 tablet (2.5 mg total) by mouth daily. 90 tablet 3   aspirin EC 81 MG tablet Take 81 mg by mouth daily. Swallow whole.     atorvastatin (LIPITOR) 40 MG tablet Take 40 mg by mouth daily.     buPROPion  (WELLBUTRIN  XL) 300 MG 24 hr tablet Take 300 mg by mouth daily.     colchicine 0.6 MG tablet Take 1.2 mg by mouth daily.      DULoxetine  (CYMBALTA ) 60 MG capsule Take 60 mg daily by mouth.     hydroxychloroquine  (PLAQUENIL ) 200 MG tablet Take 400 mg by mouth daily.     lamoTRIgine  (LAMICTAL ) 200 MG tablet Take 400 mg by mouth daily.     lisdexamfetamine (VYVANSE ) 70 MG capsule Take 1 capsule (70 mg total) by mouth every morning. 30 capsule 0   losartan  (COZAAR ) 100 MG tablet Take 100 mg by mouth daily.     Lurasidone  HCl (LATUDA ) 60 MG TABS Take 60 mg by mouth daily.     omeprazole  (PRILOSEC) 40 MG capsule Take 1 capsule (40 mg total) by mouth daily. 90 capsule 3   ondansetron  (ZOFRAN -ODT) 8 MG disintegrating tablet Take 1 tablet (8 mg total) by mouth every 8 (eight) hours as needed for nausea or vomiting. 20 tablet 0   cevimeline  (EVOXAC ) 30 MG capsule Take 30 mg by mouth daily as needed (dry mouth).     TRULICITY 1.5 MG/0.5ML SOPN Inject 1.5 mg into the skin every Wednesday.     Current Facility-Administered Medications  Medication Dose Route Frequency Provider Last Rate Last Admin   0.9 %  sodium chloride  infusion  500 mL Intravenous Once Braxtyn Bojarski, Gordy HERO, MD        Allergies as of 11/08/2023 - Review Complete 11/08/2023  Allergen Reaction Noted   Penicillins Hives 04/30/2020   Sulfa antibiotics Hives 03/19/2020   Sulfonamide derivatives Hives 04/30/2020    Family History  Problem Relation Age of Onset   Multiple sclerosis Mother    Diabetes Mother        Juvenile   Psoriasis Father    Prostate cancer Father    Diabetes Father    Lupus Paternal Aunt    Breast cancer Paternal Aunt    Rheum arthritis Paternal Aunt    Breast cancer Paternal Aunt    Colon cancer Neg Hx    Esophageal cancer Neg Hx    Rectal cancer Neg Hx    Stomach cancer Neg Hx     Social History   Socioeconomic History   Marital status: Married    Spouse name: Not on file   Number of children: 2   Years of education: Not on file   Highest education level: Not on file  Occupational History   Occupation: disabled  Tobacco Use    Smoking status: Former    Current packs/day: 0.00    Types: Cigarettes    Quit date: 1994    Years since quitting: 31.0   Smokeless tobacco: Never  Vaping Use   Vaping status: Never Used  Substance and Sexual Activity   Alcohol use: No    Alcohol/week: 0.0 standard drinks of alcohol  Drug use: No   Sexual activity: Not on file  Other Topics Concern   Not on file  Social History Narrative   Lives at home w husband   R handed   Caffeine: 1 C coffee a day.    Social Drivers of Corporate Investment Banker Strain: Low Risk  (08/30/2022)   Received from [facility], [facility]   Overall Financial Resource Strain (CARDIA)    Difficulty of Paying Living Expenses: Not very hard  Food Insecurity: Low Risk  (07/07/2023)   Received from [facility]   Hunger Vital Sign    Worried About Running Out of Food in the Last Year: Never true    Ran Out of Food in the Last Year: Never true  Transportation Needs: No Transportation Needs (07/07/2023)   Received from [facility]    In the past 12 months, has lack of reliable transportation kept you from medical appointments, meetings, work or from getting things needed for daily living? : No  Physical Activity: Insufficiently Active (08/30/2022)   Received from [facility], [facility]   Exercise Vital Sign    Days of Exercise per Week: 4 days    Minutes of Exercise per Session: 20 min  Stress: Patient Declined (08/30/2022)   Received from [facility], [facility] Occupational Health - Occupational Stress Questionnaire    Feeling of Stress : Patient declined  Social Connections: Moderately Integrated (08/30/2022)   Received from [facility], [facility]   Social Network    How would you rate your social network (family, work, friends)?: Adequate participation with social networks  Intimate Partner Violence: Not At Risk (08/30/2022)   Received from [facility], [facility]    HITS    Over the last 12 months how often did your partner physically hurt you?: Never    Over the last 12 months how often did your partner insult you or talk down to you?: Never    Over the last 12 months how often did your partner threaten you with physical harm?: Never    Over the last 12 months how often did your partner scream or curse at you?: Never    Physical Exam: Vital signs in last 24 hours: @BP  120/71   Pulse 70   Temp 97.7 F (36.5 C)   Ht 5' 7 (1.702 m)   Wt 221 lb (100.2 kg)   SpO2 98%   BMI 34.61 kg/m  GEN: NAD EYE: Sclerae anicteric ENT: MMM CV: Non-tachycardic Pulm: CTA b/l GI: Soft, NT/ND NEURO:  Alert & Oriented x 3   Gordy Starch, MD [facility]  11/08/2023 2:16 PM

## 2023-11-09 ENCOUNTER — Telehealth: Payer: Self-pay

## 2023-11-09 NOTE — Telephone Encounter (Signed)
  Follow up Call-     11/08/2023    1:48 PM  Call back number  Post procedure Call Back phone  # [phone]  Permission to leave phone message Yes     Patient questions:  Do you have a fever, pain , or abdominal swelling? No. Pain Score  0 *  Have you tolerated food without any problems? Yes.    Have you been able to return to your normal activities? Yes.    Do you have any questions about your discharge instructions: Diet   No. Medications  No. Follow up visit  No.  Do you have questions or concerns about your Care? No.  Actions: * If pain score is 4 or above: No action needed, pain <4.

## 2023-11-09 NOTE — Telephone Encounter (Signed)
 Please give update on PA for Trulance.

## 2023-11-10 ENCOUNTER — Telehealth: Payer: Self-pay | Admitting: Internal Medicine

## 2023-11-10 NOTE — Telephone Encounter (Signed)
 Left detailed message explaining to patient we have not heard back from our PA team if the Trulance  PA was denied or approved. Left message stating I have reached back out to them and wanted to give her some Trulance  samples in the mean time. To please contact our office if she needs the samples.

## 2023-11-10 NOTE — Telephone Encounter (Signed)
 PT advised she is returning a call she received from CMA. Please advise.

## 2023-11-11 NOTE — Telephone Encounter (Signed)
 Please give status update of PA of Trulance. Thanks.

## 2023-11-12 ENCOUNTER — Telehealth: Payer: Self-pay | Admitting: Pharmacy Technician

## 2023-11-12 ENCOUNTER — Other Ambulatory Visit (HOSPITAL_COMMUNITY): Payer: Self-pay

## 2023-11-12 ENCOUNTER — Encounter: Payer: Self-pay | Admitting: Hematology & Oncology

## 2023-11-12 LAB — SURGICAL PATHOLOGY

## 2023-11-12 NOTE — Telephone Encounter (Signed)
 I have sent multiple messages trying to get a response from the PA team. Please let me know the PA response of Trulance.

## 2023-11-12 NOTE — Telephone Encounter (Signed)
 Pharmacy Patient Advocate Encounter   Received notification from Pt Calls Messages that prior authorization for TRULANCE  3MG  is required/requested.   Insurance verification completed.   The patient is insured through [facility] .   Per test claim: PA required; PA submitted to above mentioned insurance via CoverMyMeds Key/confirmation #/EOC [id number] Status is pending

## 2023-11-12 NOTE — Telephone Encounter (Signed)
 PA expired and pt insurance changed from [employer] to [employer]. New PA has been submitted

## 2023-11-15 ENCOUNTER — Encounter: Payer: Self-pay | Admitting: Internal Medicine

## 2023-11-18 NOTE — Telephone Encounter (Signed)
Inbound call from [facility], [place] stating Trulance medication has been denied. Stated denial letter will be faxed over. Please advise, thank you

## 2023-11-18 NOTE — Telephone Encounter (Signed)
It appears the original submission did not go through and questions were pending. Submission has been successfully submitted and status is pending.

## 2023-11-19 NOTE — Telephone Encounter (Signed)
Informed patient that Trulance has been denied through her insurance. Patient states she is getting new insurance in February and there may not be a point to try to appeal this denial or send another medication to through her insurance. Patient states her new insurance may cover Trulance. Informed patient to MyChart message me when she receives her new insurance cards and will scan them in to our computer system. Then we will try again to get a PA for Trulance with her new insurance. Patient agreed.

## 2023-11-25 ENCOUNTER — Encounter: Payer: Self-pay | Admitting: Internal Medicine

## 2023-11-25 NOTE — Telephone Encounter (Signed)
Inbound call from patient, states she received her new insurance card and would upload it on a MyChart message. Advised patient, I could assist in adding card into system. Patient is also requesting more Trulance samples until PA gets approved.

## 2023-11-25 NOTE — Telephone Encounter (Signed)
Returned patient's call and informed her that I can leave samples at the front desk. Patient states her new insurance does not start until 12/04/23. Informed patient I will send in a prescription at that time so I can get a PA with the new insurance. Patient verbalized understanding.

## 2023-12-06 MED ORDER — TRULANCE 3 MG PO TABS: 1.0000 | ORAL_TABLET | Freq: Every day | ORAL | 1 refills | Status: DC

## 2023-12-06 NOTE — Addendum Note (Signed)
Addended by: Illene Bolus on: 12/06/2023 09:59 AM   Modules accepted: Orders

## 2023-12-06 NOTE — Telephone Encounter (Signed)
Please initiate new PA for Trulance under new insurance [facility] cards in [place]. Patient has tried and failed 3 other medications: Linzess, Miralax, Motegrity, thanks.

## 2023-12-08 ENCOUNTER — Encounter: Payer: Self-pay | Admitting: Hematology & Oncology

## 2023-12-08 ENCOUNTER — Other Ambulatory Visit (HOSPITAL_COMMUNITY): Payer: Self-pay

## 2023-12-08 ENCOUNTER — Telehealth: Payer: Self-pay

## 2023-12-08 NOTE — Telephone Encounter (Signed)
 No prior auth needed. Please see message on test claim.

## 2023-12-08 NOTE — Telephone Encounter (Signed)
 Pharmacy Patient Advocate Encounter  Insurance verification completed.   The patient is insured through [employer] test claim for Trulance  3 mg tablets. Currently a quantity of 90 is a 90 day supply and the co-pay is $12.15 . The current 90 day co-pay is, $12.15.  No PA needed at this time.  This test claim was processed through [facility]- copay amounts may vary at other pharmacies due to pharmacy/plan contracts, or as the patient moves through the different stages of their insurance plan.

## 2023-12-09 NOTE — Telephone Encounter (Signed)
 Sent the patient a message on this to let him know

## 2023-12-10 ENCOUNTER — Other Ambulatory Visit: Payer: Self-pay | Admitting: Obstetrics and Gynecology

## 2023-12-10 DIAGNOSIS — Z803 Family history of malignant neoplasm of breast: Secondary | ICD-10-CM

## 2023-12-13 ENCOUNTER — Encounter: Payer: Self-pay | Admitting: Hematology & Oncology

## 2023-12-30 ENCOUNTER — Ambulatory Visit: Payer: Medicare HMO | Admitting: Internal Medicine

## 2024-01-11 ENCOUNTER — Ambulatory Visit: Admitting: Internal Medicine

## 2024-01-20 ENCOUNTER — Other Ambulatory Visit: Payer: Medicare Other

## 2024-02-03 ENCOUNTER — Other Ambulatory Visit: Payer: Self-pay | Admitting: Obstetrics and Gynecology

## 2024-02-03 ENCOUNTER — Ambulatory Visit
Admission: RE | Admit: 2024-02-03 | Discharge: 2024-02-03 | Disposition: A | Discharge status: Home / Self Care | Payer: Medicare HMO | Source: Ambulatory Visit | Attending: Obstetrics and Gynecology | Admitting: Obstetrics and Gynecology

## 2024-02-03 DIAGNOSIS — N6310 Unspecified lump in the right breast, unspecified quadrant: Secondary | ICD-10-CM

## 2024-02-04 ENCOUNTER — Encounter: Payer: Self-pay | Admitting: Hematology & Oncology

## 2024-02-06 ENCOUNTER — Ambulatory Visit
Admission: RE | Admit: 2024-02-06 | Discharge: 2024-02-06 | Disposition: A | Discharge status: Home / Self Care | Source: Ambulatory Visit | Attending: Obstetrics and Gynecology | Admitting: Obstetrics and Gynecology

## 2024-02-06 DIAGNOSIS — Z803 Family history of malignant neoplasm of breast: Secondary | ICD-10-CM

## 2024-02-06 MED ORDER — GADOPICLENOL 0.5 MMOL/ML IV SOLN
10.0000 mL | Freq: Once | INTRAVENOUS | Status: AC | PRN
Start: 2024-02-06 — End: 2024-02-06
  Administered 2024-02-06: 10 mL via INTRAVENOUS

## 2024-02-08 ENCOUNTER — Telehealth: Payer: Self-pay

## 2024-02-08 NOTE — Telephone Encounter (Signed)
**Note De-identified  [place] Obfuscation** Please advise 

## 2024-02-08 NOTE — Telephone Encounter (Signed)
 Please advise. We do have samples in office but not sure if it is enough to cover through April.

## 2024-02-08 NOTE — Telephone Encounter (Signed)
 Contacted patient in in regards to MyChart message. Patient stated that the pharmacy said that her Trulance can be filled in a couple of days but she will give me a call back to let me know if she needs the samples or not. Patient was also requesting nausea medicine. Please advise

## 2024-02-10 ENCOUNTER — Other Ambulatory Visit: Payer: Self-pay

## 2024-02-10 MED ORDER — ONDANSETRON 8 MG PO TBDP: 8.0000 mg | ORAL_TABLET | Freq: Three times a day (TID) | ORAL | 0 refills | Status: DC | PRN

## 2024-02-10 NOTE — Addendum Note (Signed)
 Addended by: Rise Paganini on: 02/10/2024 09:58 AM   Modules accepted: Orders

## 2024-02-10 NOTE — Telephone Encounter (Signed)
 Patient returned call. States if she does not answer, voicemail can be left.

## 2024-02-10 NOTE — Telephone Encounter (Signed)
 DD, refer to mychart message regarding [place] and Ondansetron refills. Ok to give patient samples of Trulance 3 mg one tab po every day # 14 if needed. THX.

## 2024-02-10 NOTE — Telephone Encounter (Signed)
 DD, please inform the patient that I was not in the office 4/8 and 4/9 to address this messaged. Patient of Dr. Lauro Franklin, I have not yet seen. Ok to refill Trulance 3mg  one tab po every day # 90, 1 refill. Ok to refill Ondansetron 8mg  ODT dissolve one tab on tongue Q 8 hrs PRN. # 30, no additional refills.

## 2024-02-10 NOTE — Telephone Encounter (Signed)
 Contacted patient and patient stated that her Trulance is supposed to be getting filled on the 12th and she is aware of Ondansetron being sent to pharmacy.

## 2024-02-10 NOTE — Telephone Encounter (Signed)
 DD, ok to give patient RX for Trulance 3mg  one tab po every day # 90, 2 refills.  Ok to give patient RX for Ondansetron 8mg  ODT one tab dissolve on tongue Q 8 hrs PRN # 30, no refills.

## 2024-02-10 NOTE — Telephone Encounter (Signed)
 Ondansetron was sent to pharmacy at [facility] on [address]. I attempted to contact the patient and left a voicemail to return call.

## 2024-02-16 ENCOUNTER — Ambulatory Visit: Payer: Medicare HMO | Admitting: Medical Oncology

## 2024-02-16 ENCOUNTER — Inpatient Hospital Stay: Payer: Self-pay

## 2024-02-17 ENCOUNTER — Other Ambulatory Visit: Payer: Self-pay | Admitting: Cardiovascular Disease

## 2024-02-17 ENCOUNTER — Other Ambulatory Visit: Payer: Self-pay

## 2024-02-17 MED ORDER — ONDANSETRON HCL 8 MG PO TABS: 8.0000 mg | ORAL_TABLET | Freq: Three times a day (TID) | ORAL | 0 refills | Status: DC | PRN

## 2024-02-17 NOTE — Telephone Encounter (Signed)
 DD, ok to refill Zofran as previously prescribed if not already done. THX.

## 2024-02-21 ENCOUNTER — Inpatient Hospital Stay: Payer: Self-pay

## 2024-02-21 ENCOUNTER — Ambulatory Visit: Payer: Self-pay | Admitting: Medical Oncology

## 2024-02-24 ENCOUNTER — Inpatient Hospital Stay: Payer: Self-pay

## 2024-02-24 ENCOUNTER — Inpatient Hospital Stay: Payer: Self-pay | Admitting: Medical Oncology

## 2024-03-02 ENCOUNTER — Ambulatory Visit: Admitting: Nurse Practitioner

## 2024-03-14 ENCOUNTER — Inpatient Hospital Stay: Attending: Medical Oncology

## 2024-03-14 ENCOUNTER — Encounter: Payer: Self-pay | Admitting: Medical Oncology

## 2024-03-14 ENCOUNTER — Inpatient Hospital Stay (HOSPITAL_BASED_OUTPATIENT_CLINIC_OR_DEPARTMENT_OTHER): Admitting: Medical Oncology

## 2024-03-14 VITALS — BP 109/42 | HR 62 | Temp 98.2°F | Resp 18 | Ht 67.0 in | Wt 209.0 lb

## 2024-03-14 DIAGNOSIS — D500 Iron deficiency anemia secondary to blood loss (chronic): Secondary | ICD-10-CM

## 2024-03-14 DIAGNOSIS — E639 Nutritional deficiency, unspecified: Secondary | ICD-10-CM | POA: Diagnosis not present

## 2024-03-14 DIAGNOSIS — K922 Gastrointestinal hemorrhage, unspecified: Secondary | ICD-10-CM | POA: Insufficient documentation

## 2024-03-14 LAB — CBC WITH DIFFERENTIAL (CANCER CENTER ONLY)
Abs Immature Granulocytes: 0.02 10*3/uL (ref 0.00–0.07)
Basophils Absolute: 0 10*3/uL (ref 0.0–0.1)
Basophils Relative: 0 %
Eosinophils Absolute: 0.2 10*3/uL (ref 0.0–0.5)
Eosinophils Relative: 2 %
HCT: 40.5 % (ref 36.0–46.0)
Hemoglobin: 13 g/dL (ref 12.0–15.0)
Immature Granulocytes: 0 %
Lymphocytes Relative: 29 %
Lymphs Abs: 2.4 10*3/uL (ref 0.7–4.0)
MCH: 28.7 pg (ref 26.0–34.0)
MCHC: 32.1 g/dL (ref 30.0–36.0)
MCV: 89.4 fL (ref 80.0–100.0)
Monocytes Absolute: 0.7 10*3/uL (ref 0.1–1.0)
Monocytes Relative: 8 %
Neutro Abs: 5 10*3/uL (ref 1.7–7.7)
Neutrophils Relative %: 61 %
Platelet Count: 279 10*3/uL (ref 150–400)
RBC: 4.53 MIL/uL (ref 3.87–5.11)
RDW: 13.7 % (ref 11.5–15.5)
WBC Count: 8.2 10*3/uL (ref 4.0–10.5)
nRBC: 0 % (ref 0.0–0.2)

## 2024-03-14 LAB — CMP (CANCER CENTER ONLY)
ALT: 15 U/L (ref 0–44)
AST: 20 U/L (ref 15–41)
Albumin: 4.1 g/dL (ref 3.5–5.0)
Alkaline Phosphatase: 60 U/L (ref 38–126)
Anion gap: 6 (ref 5–15)
BUN: 12 mg/dL (ref 6–20)
CO2: 31 mmol/L (ref 22–32)
Calcium: 9.3 mg/dL (ref 8.9–10.3)
Chloride: 104 mmol/L (ref 98–111)
Creatinine: 0.81 mg/dL (ref 0.44–1.00)
GFR, Estimated: >60 mL/min (ref >60)
Glucose, Bld: 108 mg/dL — ABNORMAL HIGH (ref 70–99)
Potassium: 4.2 mmol/L (ref 3.5–5.1)
Sodium: 141 mmol/L (ref 135–145)
Total Bilirubin: 0.4 mg/dL (ref 0.0–1.2)
Total Protein: 6.6 g/dL (ref 6.5–8.1)

## 2024-03-14 LAB — FERRITIN: Ferritin: 58 ng/mL (ref 11–307)

## 2024-03-14 NOTE — Progress Notes (Signed)
 Hematology and Oncology Follow Up Visit  [name] [id number] August 19, 1971 53 y.o. 03/14/2024   Principle Diagnosis:  Iron  deficiency anemia - GI loss   Current Therapy: IV iron  as indicated   Work Up:  01/31/15  colonoscopy by Dr. Elvin Hammer. Findings of a normal terminal ileum and the ascending colon and otherwise normal exam. Pathology revealed a tubular adenoma and patient was told to follow-up in 5 years. At that time patient was experiencing diarrhea.  11/2017  EGD with Dr. Elvin Hammer for nausea, vomiting, decreased p.o. intake showed mild Schatzki ring, small amount of food in the stomach consistent with documented gastroparesis, gastric polyps biopsied, benign. 06/12/2020 Colonoscopy with [facility] Shay for screening purposes, recall 5 years (06/2025) 02/04/2021 [facility]  with EGD for nausea, vomiting, decreased n.p.o. and mild abdominal discomfort Path showed mild chronic inflammation with focal intestinal metaplasia no evidence of malignancy, fundic gland polyps no evidence of malignancy.  Negative H. Pylori 05/01/2023 Endoscopy with [facility]. Benign polyps without bleeding for melena 11/08/2023 Endoscopy: with [facility]. Mild hyperplastic changes    Interim History:  [name] is here today for follow-up.   She is doing well overall.  She has her Endoscopy on 11/08/2023 which went well.  Perimenopausal- [facility] 3 months ago. No chance for pregnancy.  There has been no bleeding to her knowledge: denies epistaxis, gingivitis, hemoptysis, hematemesis, hematuria, melena, excessive bruising, blood donation.  Not taking oral iron  due to severe constipation with oral iron  No abnormal bruising, no petechiae.  Fever, chills, n/v, cough, rash, dizziness, SOB, chest pain, palpitations, abdominal pain or changes in bowel or bladder habits.  No swelling, tenderness, numbness or tingling in her extremities.  No falls or syncope.  She is on Trulicity and has gastroparesis so she has lost a lot of weight. She is using  proteins powder   Wt Readings from Last 3 Encounters:  03/14/24 209 lb (94.8 kg)  11/08/23 221 lb (100.2 kg)  10/20/23 222 lb (100.7 kg)   ECOG Performance Status: 1 - Symptomatic but completely ambulatory  Medications:  Allergies as of 03/14/2024       Reactions   Penicillins Hives   Sulfa Antibiotics Hives   Sulfonamide Derivatives Hives        Medication List        Accurate as of Mar 14, 2024  3:13 PM. If you have any questions, ask your nurse or doctor.          ALPRAZolam 1 MG tablet Commonly known as: XANAX Take 1 mg by mouth 4 (four) times daily as needed for anxiety.   amLODipine  2.5 MG tablet Commonly known as: NORVASC  Take 1 tablet (2.5 mg total) by mouth daily. PLEASE KEEP UPCOMING APPOINTMENT IN ORDER TO RECEIVE FUTURE REFILLS! THANK YOU   aspirin EC 81 MG tablet Take 81 mg by mouth daily. Swallow whole.   atorvastatin 40 MG tablet Commonly known as: LIPITOR Take 40 mg by mouth daily.   buPROPion  300 MG 24 hr tablet Commonly known as: WELLBUTRIN  XL Take 300 mg by mouth daily.   cevimeline  30 MG capsule Commonly known as: EVOXAC  Take 30 mg by mouth daily as needed (dry mouth).   colchicine 0.6 MG tablet Take 1.2 mg by mouth daily.   DULoxetine  60 MG capsule Commonly known as: CYMBALTA  Take 60 mg daily by mouth.   hydroxychloroquine  200 MG tablet Commonly known as: PLAQUENIL  Take 400 mg by mouth daily.   lamoTRIgine  200 MG tablet Commonly known as: LAMICTAL  Take 400  mg by mouth daily.   Latuda  60 MG Tabs Generic drug: Lurasidone  HCl Take 60 mg by mouth daily.   lisdexamfetamine 70 MG capsule Commonly known as: Vyvanse  Take 1 capsule (70 mg total) by mouth every morning.   losartan  100 MG tablet Commonly known as: COZAAR  Take 100 mg by mouth daily.   omeprazole  40 MG capsule Commonly known as: PRILOSEC Take 1 capsule (40 mg total) by mouth daily.   ondansetron  8 MG tablet Commonly known as: ZOFRAN  Take 1 tablet (8 mg  total) by mouth every 8 (eight) hours as needed for nausea or vomiting.   Trulance  3 MG Tabs Generic drug: Plecanatide  Take 1 tablet (3 mg total) by mouth daily.   Trulicity 1.5 MG/0.5ML Soaj Generic drug: Dulaglutide Inject 1.5 mg into the skin every Wednesday.        Allergies:  Allergies  Allergen Reactions   Penicillins Hives   Sulfa Antibiotics Hives   Sulfonamide Derivatives Hives    Past Medical History, Surgical history, Social history, and Family History were reviewed and updated.  Review of Systems: All other 10 point review of systems is negative.   Physical Exam:  height is 5\' 7"  (1.702 m) and weight is 209 lb (94.8 kg). Her oral temperature is 98.2 F (36.8 C). Her blood pressure is 109/42 (abnormal) and her pulse is 62. Her respiration is 18 and oxygen saturation is 99%.   Wt Readings from Last 3 Encounters:  03/14/24 209 lb (94.8 kg)  11/08/23 221 lb (100.2 kg)  10/20/23 222 lb (100.7 kg)    Ocular: Sclerae unicteric, pupils equal, round and reactive to light Ear-nose-throat: Oropharynx clear, dentition fair Lymphatic: No cervical or supraclavicular adenopathy Lungs no rales or rhonchi, good excursion bilaterally Heart regular rate and rhythm, no murmur appreciated Abd soft, nontender, positive bowel sounds MSK no focal spinal tenderness, no joint edema Neuro: non-focal, well-oriented, appropriate affect  Lab Results  Component Value Date   WBC 8.2 03/14/2024   HGB 13.0 03/14/2024   HCT 40.5 03/14/2024   MCV 89.4 03/14/2024   PLT 279 03/14/2024   Lab Results  Component Value Date   FERRITIN 53 10/20/2023   IRON  69 10/20/2023   TIBC 295 10/20/2023   UIBC 226 10/20/2023   IRONPCTSAT 23 10/20/2023   Lab Results  Component Value Date   RETICCTPCT 1.5 10/20/2023   RBC 4.53 03/14/2024   No results found for: "KPAFRELGTCHN", "LAMBDASER", "KAPLAMBRATIO" Lab Results  Component Value Date   IGGSERUM 1,134 05/19/2021   IGA 154 05/19/2021    IGMSERUM 124 05/19/2021   No results found for: "TOTALPROTELP", "ALBUMINELP", "A1GS", "A2GS", "BETS", "BETA2SER", "GAMS", "MSPIKE", "SPEI"   Chemistry      Component Value Date/Time   NA 140 10/20/2023 1402   NA 138 01/09/2022 1524   K 4.0 10/20/2023 1402   CL 103 10/20/2023 1402   CO2 28 10/20/2023 1402   BUN 12 10/20/2023 1402   BUN 9 01/09/2022 1524   CREATININE 0.76 10/20/2023 1402      Component Value Date/Time   CALCIUM 8.8 (L) 10/20/2023 1402   ALKPHOS 62 10/20/2023 1402   AST 19 10/20/2023 1402   ALT 23 10/20/2023 1402   BILITOT 0.4 10/20/2023 1402     Encounter Diagnosis  Name Primary?   Iron  deficiency anemia due to chronic blood loss Yes    Impression and Plan: [name] is a very pleasant 53 yo caucasian female with history iron  deficiency anemia secondary to GI bleed as  well as several autoimmune issues including Sjogren's, fibromyalgia and type II diabetes. See GI work up above.   Today CBC shows a 13.0  No evidence of known bleeding.  She will continue to work with PCP on BP medications given her weight loss and BP Iron  studies pending.  Hydration with water for her softer BP today. Fortunately she is asymptomatic  RTC 3 months APP, labs ( CBC w/, CMP, ferritin, iron , retic)-Cecilia   Sharla Davis, PA-C 5/13/20253:13 PM

## 2024-03-15 ENCOUNTER — Ambulatory Visit: Payer: Self-pay | Admitting: Medical Oncology

## 2024-03-15 LAB — IRON AND IRON BINDING CAPACITY (CC-WL,HP ONLY)
Iron: 73 ug/dL (ref 28–170)
Saturation Ratios: 25 % (ref 10.4–31.8)
TIBC: 288 ug/dL (ref 250–450)
UIBC: 215 ug/dL (ref 148–442)

## 2024-03-23 ENCOUNTER — Encounter: Payer: Self-pay | Admitting: Nurse Practitioner

## 2024-03-23 ENCOUNTER — Ambulatory Visit: Admitting: Nurse Practitioner

## 2024-03-23 VITALS — BP 92/70 | HR 67 | Ht 65.0 in | Wt 207.0 lb

## 2024-03-23 DIAGNOSIS — K3184 Gastroparesis: Secondary | ICD-10-CM | POA: Diagnosis not present

## 2024-03-23 DIAGNOSIS — Z860102 Personal history of hyperplastic colon polyps: Secondary | ICD-10-CM

## 2024-03-23 DIAGNOSIS — Z860101 Personal history of adenomatous and serrated colon polyps: Secondary | ICD-10-CM

## 2024-03-23 DIAGNOSIS — K219 Gastro-esophageal reflux disease without esophagitis: Secondary | ICD-10-CM | POA: Diagnosis not present

## 2024-03-23 DIAGNOSIS — K5909 Other constipation: Secondary | ICD-10-CM | POA: Diagnosis not present

## 2024-03-23 DIAGNOSIS — D509 Iron deficiency anemia, unspecified: Secondary | ICD-10-CM

## 2024-03-23 MED ORDER — ONDANSETRON HCL 8 MG PO TABS: 8.0000 mg | ORAL_TABLET | Freq: Three times a day (TID) | ORAL | 1 refills | Status: AC | PRN

## 2024-03-23 MED ORDER — TRULANCE 3 MG PO TABS: 1.0000 | ORAL_TABLET | Freq: Every day | ORAL | 3 refills | Status: AC

## 2024-03-23 NOTE — Progress Notes (Signed)
 03/23/2024 [name] [id number] [birth year]   Chief Complaint: GERD and gastroparesis follow up  History of Present Illness: [name] is a 53 year old female with a past medical history of anxiety, depression, fibromyalgia,  hypertension, hyperlipidemia, DM type II, Raynaud's syndrome, IDA, GERD, gastroparesis and chronic constipation. She presents today for further follow up regarding GERD, gastroparesis and constipation. She remains on Omeprazole  40mg  every day. Decreased appetite, has lost 14 lbs lbs possible since 11/2024 which she some what attributes to taking to Vyvanse . She is eating 3 small meals daily which keeps her gastroparesis symptoms fairy well controlled. She is taking Ondansetron  as needed, about 3 days weekly. She request Rx for Ondansetron  8 mg tab and not ODT. Since starting Trulance , she is passing mud like or watery diarrhea once daily and she is content with this bowel pattern which is much better than going many days without a BM. EGD 05/01/2023 done during hospital admission with melena was normal. Her most recent colonoscopy 11/08/2023 identified 3 tubular adenomatous polyps and one polypoid/hyperplastic polyp removed from the colon and rectum.      Latest Ref Rng & Units 03/14/2024    2:44 PM 10/20/2023    2:02 PM 05/02/2023   11:56 AM  CBC  WBC 4.0 - 10.5 K/uL 8.2  6.8    Hemoglobin 12.0 - 15.0 g/dL 52.8  41.3  24.4   Hematocrit 36.0 - 46.0 % 40.5  41.8  36.5   Platelets 150 - 400 K/uL 279  228          Latest Ref Rng & Units 03/14/2024    2:44 PM 10/20/2023    2:02 PM 05/02/2023    4:28 AM  CMP  Glucose 70 - 99 mg/dL 010  272  83   BUN 6 - 20 mg/dL 12  12  6    Creatinine 0.44 - 1.00 mg/dL 5.36  6.44  0.34   Sodium 135 - 145 mmol/L 141  140  137   Potassium 3.5 - 5.1 mmol/L 4.2  4.0  3.7   Chloride 98 - 111 mmol/L 104  103  107   CO2 22 - 32 mmol/L 31  28  22    Calcium 8.9 - 10.3 mg/dL 9.3  8.8  8.7   Total Protein 6.5 - 8.1 g/dL 6.6  6.2  5.9    Total Bilirubin 0.0 - 1.2 mg/dL 0.4  0.4  0.9   Alkaline Phos 38 - 126 U/L 60  62  48   AST 15 - 41 U/L 20  19  21    ALT 0 - 44 U/L 15  23  17      PAST GI EVALUATION:   Prior to 2019 patient was followed with [facility] gastroenterology, had fecal impaction at that time started on Linzess  to 290.  Normal sed rate, CRP.  01/31/15  colonoscopy by Dr. Elvin Hammer. Findings of a normal terminal ileum and the ascending colon and otherwise normal exam. Pathology revealed a tubular adenoma and patient was told to follow-up in 5 years. At that time patient was experiencing diarrhea.   10/2017 GES with mild gastroparesis 77% emptied at 4 hours.  11/2017  EGD with Dr. Elvin Hammer for nausea, vomiting, decreased p.o. intake showed mild Schatzki ring, small amount of food in the stomach consistent with documented gastroparesis, gastric polyps biopsied, benign.  06/12/2020 Colonoscopy with [facility] Sh[facility] for screening purposes, recall 5 years (06/2025)  02/04/2021 [facility]  with EGD for nausea, vomiting, decreased  n.p.o. and mild abdominal discomfort Path showed mild chronic inflammation with focal intestinal metaplasia no evidence of malignancy, fundic gland polyps no evidence of malignancy.  Negative H. Pylori  EGD 05/01/2023 as an inpatient secondary to heme positive stools and melena: - Normal examined duodenum.  - No evidence of UGI bleeding.  - No specimens collected.   Colonoscopy 11/08/2023:  - The examined portion of the ileum was normal.  - Two 2 to 3 mm polyps in the ascending colon, removed with a cold biopsy forceps. Resected and retrieved.  - One 5 mm polyp at the hepatic flexure, removed with a cold snare. Resected and retrieved.  - One 5 mm polyp in the rectum, removed with a cold snare. Resected and retrieved. - Internal hemorrhoids. - 5 year recall colonoscopy        1. Surgical [P], colon, ascending, polyp (3) :       - TUBULAR ADENOMA WITHOUT HIGH-GRADE DYSPLASIA OR MALIGNANCY       - OTHER  FRAGMENTS OF POLYPOID COLONIC MUCOSA WITH MILD HYPERPLASTIC CHANGES        2. Surgical [P], colon, rectum, polyp (1) :       - TUBULAR ADENOMA WITHOUT HIGH-GRADE DYSPLASIA OR MALIGNANCY   Current Outpatient Medications on File Prior to Visit  Medication Sig Dispense Refill   ALPRAZolam (XANAX) 1 MG tablet Take 1 mg by mouth 4 (four) times daily as needed for anxiety.     amLODipine  (NORVASC ) 2.5 MG tablet Take 1 tablet (2.5 mg total) by mouth daily. PLEASE KEEP UPCOMING APPOINTMENT IN ORDER TO RECEIVE FUTURE REFILLS! THANK YOU 90 tablet 1   aspirin EC 81 MG tablet Take 81 mg by mouth daily. Swallow whole.     atorvastatin (LIPITOR) 80 MG tablet Take 80 mg by mouth at bedtime.     buPROPion  (WELLBUTRIN  XL) 300 MG 24 hr tablet Take 300 mg by mouth daily.     cevimeline  (EVOXAC ) 30 MG capsule Take 30 mg by mouth daily as needed (dry mouth).     colchicine 0.6 MG tablet Take 1.2 mg by mouth daily.     diazepam (VALIUM) 5 MG tablet Take 5 mg by mouth 4 (four) times daily.     DULoxetine  (CYMBALTA ) 60 MG capsule Take 60 mg daily by mouth.     hydroxychloroquine  (PLAQUENIL ) 200 MG tablet Take 400 mg by mouth daily.     lamoTRIgine  (LAMICTAL ) 200 MG tablet Take 400 mg by mouth daily.     lisdexamfetamine (VYVANSE ) 70 MG capsule Take 1 capsule (70 mg total) by mouth every morning. 30 capsule 0   losartan  (COZAAR ) 100 MG tablet Take 100 mg by mouth daily.     Lurasidone  HCl (LATUDA ) 60 MG TABS Take 60 mg by mouth daily.     mupirocin ointment (BACTROBAN) 2 % Apply 1 Application topically as needed.     omeprazole  (PRILOSEC) 40 MG capsule Take 1 capsule (40 mg total) by mouth daily. 90 capsule 3   TRULICITY 1.5 MG/0.5ML SOPN Inject 1.5 mg into the skin every Wednesday.     No current facility-administered medications on file prior to visit.   Allergies  Allergen Reactions   Penicillins Hives   Sulfa Antibiotics Hives   Sulfonamide Derivatives Hives   Current Medications, Allergies, Past Medical  History, Past Surgical History, Family History and Social History were reviewed in [employer] record.  Review of Systems:   Constitutional: Negative for fever, sweats, chills or weight loss.  Respiratory: Negative for shortness of breath.   Cardiovascular: Negative for chest pain, palpitations and leg swelling.  Gastrointestinal: See HPI.  Musculoskeletal: Negative for back pain or muscle aches.  Neurological: Negative for dizziness, headaches or paresthesias.   Physical Exam: Wt Readings from Last 3 Encounters:  03/23/24 207 lb (93.9 kg)  03/14/24 209 lb (94.8 kg)  11/08/23 221 lb (100.2 kg)    General: 53 year old female in no acute distress. Head: Normocephalic and atraumatic. Eyes: No scleral icterus. Conjunctiva pink . Ears: Normal auditory acuity. Mouth: Dentition intact. No ulcers or lesions.  Lungs: Clear throughout to auscultation. Heart: Regular rate and rhythm, no murmur. Abdomen: Soft, nontender and nondistended. No masses or hepatomegaly. Normal bowel sounds x 4 quadrants.  Rectal: Deferred.  Musculoskeletal: Symmetrical with no gross deformities. Extremities: No edema. Neurological: Alert oriented x 4. No focal deficits.  Psychological: Alert and cooperative. Normal mood and affect  Assessment and Recommendations:  53 year old female with GERD. Normal EGD 04/22/2023.  -Continue Omeprazole  40mg  every day -GERD diet   Gastroparesis -3 to 4 small snack sized meals daily -Ondansetron  8mg  one tab po Q 8 hrs PRN   Colon polyps. Her most recent colonoscopy 11/08/2023 identified 3 tubular adenomatous polyps and one polypoid/hyperplastic polyp removed from the colon and rectum.  -Next colonoscopy due 11/2028  Chronic constipation  -Continue Trulance  3mg  one tap po every day -Fiber diet as tolerated   IDA followed by hematology. EGD 04/2023 done during hospitalization with melena was unrevealing and colonoscopy 11/2023 findings did not explain  IDA. Hg 13.  -Continue follow up with hematology

## 2024-03-23 NOTE — Patient Instructions (Signed)
 We have sent the following medications to your pharmacy for you to pick up at your convenience: Trulance  and ondansetron .   You will be due for a recall colonoscopy in 11/2029. We will send you a reminder in the mail when it gets closer to that time.  _______________________________________________________  If your blood pressure at your visit was 140/90 or greater, please contact your primary care physician to follow up on this.  _______________________________________________________  If you are age 74 or older, your body mass index should be between 23-30. Your Body mass index is 34.45 kg/m. If this is out of the aforementioned range listed, please consider follow up with your Primary Care Provider.  If you are age 46 or younger, your body mass index should be between 19-25. Your Body mass index is 34.45 kg/m. If this is out of the aformentioned range listed, please consider follow up with your Primary Care Provider.   ________________________________________________________  The [facility] providers would like to encourage you to use MYCHART to communicate with providers for non-urgent requests or questions.  Due to long hold times on the telephone, sending your provider a message by [facility] may be a faster and more efficient way to get a response.  Please allow 48 business hours for a response.  Please remember that this is for non-urgent requests.  _______________________________________________________

## 2024-03-28 NOTE — Progress Notes (Signed)
 Addendum: Reviewed and agree with assessment and management plan. Asha Grumbine, Carie Caddy, MD

## 2024-05-01 ENCOUNTER — Other Ambulatory Visit: Payer: Self-pay

## 2024-05-01 ENCOUNTER — Emergency Department (HOSPITAL_BASED_OUTPATIENT_CLINIC_OR_DEPARTMENT_OTHER)

## 2024-05-01 ENCOUNTER — Encounter (HOSPITAL_BASED_OUTPATIENT_CLINIC_OR_DEPARTMENT_OTHER): Payer: Self-pay | Admitting: Emergency Medicine

## 2024-05-01 ENCOUNTER — Emergency Department (HOSPITAL_BASED_OUTPATIENT_CLINIC_OR_DEPARTMENT_OTHER)
Admission: EM | Admit: 2024-05-01 | Discharge: 2024-05-01 | Disposition: A | Discharge status: Home / Self Care | Attending: Emergency Medicine | Admitting: Emergency Medicine

## 2024-05-01 DIAGNOSIS — R110 Nausea: Secondary | ICD-10-CM | POA: Insufficient documentation

## 2024-05-01 DIAGNOSIS — E119 Type 2 diabetes mellitus without complications: Secondary | ICD-10-CM | POA: Insufficient documentation

## 2024-05-01 DIAGNOSIS — R109 Unspecified abdominal pain: Secondary | ICD-10-CM | POA: Diagnosis present

## 2024-05-01 DIAGNOSIS — I10 Essential (primary) hypertension: Secondary | ICD-10-CM | POA: Insufficient documentation

## 2024-05-01 DIAGNOSIS — D72829 Elevated white blood cell count, unspecified: Secondary | ICD-10-CM | POA: Insufficient documentation

## 2024-05-01 LAB — CBC
HCT: 40.9 % (ref 36.0–46.0)
Hemoglobin: 13.7 g/dL (ref 12.0–15.0)
MCH: 29.1 pg (ref 26.0–34.0)
MCHC: 33.5 g/dL (ref 30.0–36.0)
MCV: 87 fL (ref 80.0–100.0)
Platelets: 202 10*3/uL (ref 150–400)
RBC: 4.7 MIL/uL (ref 3.87–5.11)
RDW: 13.2 % (ref 11.5–15.5)
WBC: 11 10*3/uL — ABNORMAL HIGH (ref 4.0–10.5)
nRBC: 0 % (ref 0.0–0.2)

## 2024-05-01 LAB — URINALYSIS, ROUTINE W REFLEX MICROSCOPIC
Bilirubin Urine: NEGATIVE
Glucose, UA: NEGATIVE mg/dL
Hgb urine dipstick: NEGATIVE
Ketones, ur: NEGATIVE mg/dL
Leukocytes,Ua: NEGATIVE
Nitrite: NEGATIVE
Protein, ur: NEGATIVE mg/dL
Specific Gravity, Urine: 1.015 (ref 1.005–1.030)
pH: 8.5 — ABNORMAL HIGH (ref 5.0–8.0)

## 2024-05-01 LAB — COMPREHENSIVE METABOLIC PANEL WITH GFR
ALT: 18 U/L (ref 0–44)
AST: 25 U/L (ref 15–41)
Albumin: 4 g/dL (ref 3.5–5.0)
Alkaline Phosphatase: 65 U/L (ref 38–126)
Anion gap: 11 (ref 5–15)
BUN: 9 mg/dL (ref 6–20)
CO2: 25 mmol/L (ref 22–32)
Calcium: 9.1 mg/dL (ref 8.9–10.3)
Chloride: 101 mmol/L (ref 98–111)
Creatinine, Ser: 0.69 mg/dL (ref 0.44–1.00)
GFR, Estimated: >60 mL/min (ref >60)
Glucose, Bld: 92 mg/dL (ref 70–99)
Potassium: 4.2 mmol/L (ref 3.5–5.1)
Sodium: 137 mmol/L (ref 135–145)
Total Bilirubin: 0.5 mg/dL (ref 0.0–1.2)
Total Protein: 6.6 g/dL (ref 6.5–8.1)

## 2024-05-01 LAB — LIPASE, BLOOD: Lipase: 26 U/L (ref 11–51)

## 2024-05-01 MED ORDER — SODIUM CHLORIDE 0.9 % IV BOLUS
500.0000 mL | Freq: Once | INTRAVENOUS | Status: AC
Start: 2024-05-01 — End: 2024-05-01
  Administered 2024-05-01: 500 mL via INTRAVENOUS

## 2024-05-01 MED ORDER — MORPHINE SULFATE (PF) 4 MG/ML IV SOLN
4.0000 mg | Freq: Once | INTRAVENOUS | Status: AC
  Administered 2024-05-01: 4 mg via INTRAVENOUS
  Filled 2024-05-01: qty 1

## 2024-05-01 MED ORDER — IOHEXOL 300 MG/ML  SOLN
100.0000 mL | Freq: Once | INTRAMUSCULAR | Status: AC | PRN
  Administered 2024-05-01: 100 mL via INTRAVENOUS

## 2024-05-01 MED ORDER — OXYCODONE-ACETAMINOPHEN 5-325 MG PO TABS: 1.0000 | ORAL_TABLET | Freq: Four times a day (QID) | ORAL | 0 refills | Status: AC | PRN

## 2024-05-01 MED ORDER — ONDANSETRON 4 MG PO TBDP: 4.0000 mg | ORAL_TABLET | Freq: Three times a day (TID) | ORAL | 0 refills | Status: DC | PRN

## 2024-05-01 MED ORDER — CEFPODOXIME PROXETIL 200 MG PO TABS: 200.0000 mg | ORAL_TABLET | Freq: Two times a day (BID) | ORAL | 0 refills | Status: AC

## 2024-05-01 MED ORDER — SENNOSIDES-DOCUSATE SODIUM 8.6-50 MG PO TABS: 1.0000 | ORAL_TABLET | Freq: Every evening | ORAL | 0 refills | Status: DC | PRN

## 2024-05-01 MED ORDER — IOHEXOL 350 MG/ML SOLN: 100.0000 mL | Freq: Once | INTRAVENOUS | Status: DC | PRN

## 2024-05-01 MED ORDER — ONDANSETRON HCL 4 MG/2ML IJ SOLN
4.0000 mg | Freq: Once | INTRAMUSCULAR | Status: AC
  Administered 2024-05-01: 4 mg via INTRAVENOUS
  Filled 2024-05-01: qty 2

## 2024-05-01 MED ORDER — SODIUM CHLORIDE 0.9 % IV SOLN
1.0000 g | Freq: Once | INTRAVENOUS | Status: AC
  Administered 2024-05-01: 1 g via INTRAVENOUS
  Filled 2024-05-01: qty 10

## 2024-05-01 NOTE — Discharge Instructions (Signed)
 You were seen emerged from today with right flank pain.  You had some inflammation around your kidney which could indicate an infection.  I am starting you on antibiotics along with some pain medications.  Do not take these while driving as it can make you drowsy.  It may also cause constipation so would like you to take a stool softener while on these medicines.  Please follow closely with your primary care doctor in the coming week.

## 2024-05-01 NOTE — ED Triage Notes (Signed)
 Pt POV in wheelchair- c/o RLQ abd pain, emesis x3 days.  Denies diarrhea, fever, known sick contacts.

## 2024-05-01 NOTE — ED Provider Notes (Signed)
 Emergency Department Provider Note   I have reviewed the triage vital signs and the nursing notes.   HISTORY  Chief Complaint Abdominal Pain   HPI [name] is a 53 y.o. female with past history reviewed below presents emergency department with right flank pain.  She states she had some soreness in that area over the past 3 to 4 days but nothing severe.  Her appetite has been decreased.  No diarrhea.  She has been feeling nauseated with increasing pain over the past 24 hours and having dry heaves.  No pain into the chest or shortness of breath.  Pain occasionally radiates to the right lower quadrant. No fever.    Past Medical History:  Diagnosis Date   Allergy    Anemia    Anxiety    Depression    Diabetes (HCC)    Elevated LFTs    Fibromyalgia    GERD (gastroesophageal reflux disease)    Hypercholesteremia    Hypertension    Hypomagnesemia    Morbid obesity (HCC)    Seizures (HCC)    Sjoegren syndrome     Review of Systems  Constitutional: No fever/chills Cardiovascular: Denies chest pain. Respiratory: Denies shortness of breath. Gastrointestinal: Positive abdominal pain. Positive nausea, no vomiting.   Skin: Negative for rash. Neurological: Negative for headaches.  ____________________________________________   PHYSICAL EXAM:  VITAL SIGNS: ED Triage Vitals  Encounter Vitals Group     BP 05/01/24 1757 116/65     Pulse Rate 05/01/24 1757 66     Resp 05/01/24 1757 (!) 21     Temp 05/01/24 1757 98.2 F (36.8 C)     Temp src --      SpO2 05/01/24 1757 100 %     Weight 05/01/24 1758 200 lb (90.7 kg)     Height 05/01/24 1758 5' 5 (1.651 m)   Constitutional: Alert and oriented. Well appearing and in no acute distress. Eyes: Conjunctivae are normal.  Head: Atraumatic. Nose: No congestion/rhinnorhea. Mouth/Throat: Mucous membranes are moist. Neck: No stridor.   Cardiovascular: Normal rate, regular rhythm. Good peripheral circulation. Grossly normal  heart sounds.   Respiratory: Normal respiratory effort.  No retractions. Lungs CTAB. Gastrointestinal: Soft with mild right abdominal tenderness. No peritonitis. No distention.  Musculoskeletal: No lower extremity tenderness nor edema. No gross deformities of extremities. Neurologic:  Normal speech and language.  Skin:  Skin is warm, dry and intact. No rash noted.  ____________________________________________   LABS (all labs ordered are listed, but only abnormal results are displayed)  Labs Reviewed  CBC - Abnormal; Notable for the following components:      Result Value   WBC 11.0 (*)    All other components within normal limits  URINALYSIS, ROUTINE W REFLEX MICROSCOPIC - Abnormal; Notable for the following components:   pH 8.5 (*)    All other components within normal limits  LIPASE, BLOOD  COMPREHENSIVE METABOLIC PANEL WITH GFR   ____________________________________________  RADIOLOGY  CT ABDOMEN PELVIS W CONTRAST Result Date: 05/01/2024 CLINICAL DATA:  Right lower quadrant pain.  Vomiting. EXAM: CT ABDOMEN AND PELVIS WITH CONTRAST TECHNIQUE: Multidetector CT imaging of the abdomen and pelvis was performed using the standard protocol following bolus administration of intravenous contrast. RADIATION DOSE REDUCTION: This exam was performed according to the departmental dose-optimization program which includes automated exposure control, adjustment of the mA and/or kV according to patient size and/or use of iterative reconstruction technique. CONTRAST:  <See Chart> OMNIPAQUE  IOHEXOL  350 MG/ML SOLN, OMNIPAQUE  IOHEXOL   300 MG/ML SOLN COMPARISON:  05/01/2023 FINDINGS: Lower chest: Scattered atelectasis in the left lower lobe. Hepatobiliary: No focal liver abnormality. Phrygian cap on the gallbladder. No calcified gallstone or gallbladder inflammation. No biliary dilatation. Pancreas: No ductal dilatation or inflammation. Spleen: Normal in size without focal abnormality.  Adrenals/Urinary Tract: Normal adrenal glands. Heterogeneous enhancement of the right kidney suspicious for pyelonephritis. There is slight enhancement of the right renal pelvis. Punctate nonobstructing stone in the right kidney. There is no frank hydronephrosis. The left kidney is normal in appearance. Unremarkable urinary bladder. Stomach/Bowel: Unremarkable appearance of the stomach. No small bowel obstruction or inflammation. Floppy cecum located in the right mid abdomen. Diminutive normal appendix is demonstrated. Moderate volume of stool in the colon. The sigmoid colon is redundant. Vascular/Lymphatic: Mild aortic atherosclerosis. Patent portal vein. No enlarged lymph nodes in the abdomen or pelvis. Reproductive: 3 cm left ovarian cyst, simple appearing. A 2.4 cm simple appearing right ovarian cyst. Bilateral tubal occlusion device. Other: No free air, free fluid, or intra-abdominal fluid collection. Musculoskeletal: There are no acute or suspicious osseous abnormalities. Bilateral hip osteoarthritis, left greater than right IMPRESSION: 1. Heterogeneous enhancement of the right kidney suspicious for pyelonephritis. Recommend correlation with urinalysis. 2. Punctate nonobstructing stone in the right kidney. 3. Bilateral ovarian cysts, largest on the left measuring 3 cm. No specific imaging follow-up is needed. Aortic Atherosclerosis (ICD10-I70.0). Electronically Signed   By: Andrea Gasman M.D.   On: 05/01/2024 19:37    ____________________________________________   PROCEDURES  Procedure(s) performed:   Procedures  None  ____________________________________________   INITIAL IMPRESSION / ASSESSMENT AND PLAN / ED COURSE  Pertinent labs & imaging results that were available during my care of the patient were reviewed by me and considered in my medical decision making (see chart for details).   This patient is Presenting for Evaluation of abdominal pain, which does require a range of  treatment options, and is a complaint that involves a high risk of morbidity and mortality.  The Differential Diagnoses includes but is not exclusive to acute cholecystitis, intrathoracic causes for epigastric abdominal pain, gastritis, duodenitis, pancreatitis, small bowel or large bowel obstruction, abdominal aortic aneurysm, hernia, gastritis, etc.   Critical Interventions-    Medications  iohexol  (OMNIPAQUE ) 350 MG/ML injection 100 mL ( Intravenous Canceled Entry 05/01/24 1905)  sodium chloride  0.9 % bolus 500 mL (0 mLs Intravenous Stopped 05/01/24 2003)  ondansetron  (ZOFRAN ) injection 4 mg (4 mg Intravenous Given 05/01/24 1818)  morphine  (PF) 4 MG/ML injection 4 mg (4 mg Intravenous Given 05/01/24 1818)  morphine  (PF) 4 MG/ML injection 4 mg (4 mg Intravenous Given 05/01/24 1859)  iohexol  (OMNIPAQUE ) 300 MG/ML solution 100 mL (100 mLs Intravenous Contrast Given 05/01/24 1906)  cefTRIAXone (ROCEPHIN) 1 g in sodium chloride  0.9 % 100 mL IVPB (0 g Intravenous Stopped 05/01/24 2046)    Reassessment after intervention:  symptoms improved.    I did obtain Additional Historical Information from husband at bedside.    Clinical Laboratory Tests Ordered, included CBC with mild leukocytosis to 11.  LFTs and bilirubin normal.  Lipase normal.  UA negative.  Radiologic Tests Ordered, included CT abdomen/pelvis. I independently interpreted the images and agree with radiology interpretation.   Cardiac Monitor Tracing which shows NSR.    Social Determinants of Health Risk patient is a non-smoker.   Medical Decision Making: Summary:  The patient presents to the emergency department for evaluation of abdominal pain in the right flank and right lower quadrant.  Mild tenderness without peritonitis  on exam.  Plan for screening lab work and CT imaging.  Reevaluation with update and discussion with CT with possible pyelonephritis. UA fairly unremarkable. Plan for abx given location of pain and leukocytosis.  Rocephin given without issue. Patient's penicillin allergy was as a child and believes she has had Keflex in the past without issue. Advised close PCP follow up.   Patient's presentation is most consistent with acute presentation with potential threat to life or bodily function.   Disposition: discharge  ____________________________________________  FINAL CLINICAL IMPRESSION(S) / ED DIAGNOSES  Final diagnoses:  Right flank pain     NEW OUTPATIENT MEDICATIONS STARTED DURING THIS VISIT:  Discharge Medication List as of 05/01/2024  9:11 PM     START taking these medications   Details  cefpodoxime (VANTIN) 200 MG tablet Take 1 tablet (200 mg total) by mouth 2 (two) times daily for 7 days., Starting Mon 05/01/2024, Until Mon 05/08/2024, Normal    ondansetron  (ZOFRAN -ODT) 4 MG disintegrating tablet Take 1 tablet (4 mg total) by mouth every 8 (eight) hours as needed., Starting Mon 05/01/2024, Normal    oxyCODONE -acetaminophen  (PERCOCET/ROXICET) 5-325 MG tablet Take 1 tablet by mouth every 6 (six) hours as needed for severe pain (pain score 7-10)., Starting Mon 05/01/2024, Normal    senna-docusate (SENOKOT-S) 8.6-50 MG tablet Take 1 tablet by mouth at bedtime as needed for mild constipation., Starting Mon 05/01/2024, Normal        Note:  This document was prepared using Dragon voice recognition software and may include unintentional dictation errors.  Fonda Law, MD, [facility]    Khalfani Weideman, Fonda MATSU, MD 05/01/24 2212

## 2024-05-02 ENCOUNTER — Telehealth: Payer: Self-pay | Admitting: Nurse Practitioner

## 2024-05-02 NOTE — Telephone Encounter (Signed)
 Patient states that she was recently dx with kidney infection and was given IV morphine  as well as oral oxycodone  to take at home for pain. She states she takes Trulance  daily and wonders if she should continue this or increase the amount she takes to account for the pain meds. I advised she should continue taking Trulance  as prescribed once daily. She may add a stool softener temporarily if needed. Advised she must drink at least 64 oz water daily to also help combat constipation and she should minimize oral pain meds as much as possible. She verbalizes understanding.

## 2024-05-02 NOTE — Telephone Encounter (Signed)
 Inbound call from patient, states she went to the ED and has a kidney infection, she was prescribed percocet and other pain. She states she take trulance  and is unsure if she can continue taking it because she states the pain medications can cause constipation.

## 2024-05-19 ENCOUNTER — Encounter: Payer: Self-pay | Admitting: Hematology & Oncology

## 2024-05-19 ENCOUNTER — Encounter: Payer: Self-pay | Admitting: Cardiovascular Disease

## 2024-05-19 ENCOUNTER — Ambulatory Visit: Attending: Cardiovascular Disease | Admitting: Cardiovascular Disease

## 2024-05-19 VITALS — BP 102/70 | HR 67 | Ht 67.0 in | Wt 200.0 lb

## 2024-05-19 DIAGNOSIS — Z8639 Personal history of other endocrine, nutritional and metabolic disease: Secondary | ICD-10-CM

## 2024-05-19 DIAGNOSIS — I288 Other diseases of pulmonary vessels: Secondary | ICD-10-CM

## 2024-05-19 DIAGNOSIS — E669 Obesity, unspecified: Secondary | ICD-10-CM

## 2024-05-19 DIAGNOSIS — E7800 Pure hypercholesterolemia, unspecified: Secondary | ICD-10-CM | POA: Diagnosis not present

## 2024-05-19 DIAGNOSIS — I7121 Aneurysm of the ascending aorta, without rupture: Secondary | ICD-10-CM

## 2024-05-19 DIAGNOSIS — I10 Essential (primary) hypertension: Secondary | ICD-10-CM | POA: Diagnosis not present

## 2024-05-19 DIAGNOSIS — I7300 Raynaud's syndrome without gangrene: Secondary | ICD-10-CM

## 2024-05-19 NOTE — Progress Notes (Signed)
 Cardiology Office Note:    Date:  05/21/2024   ID:  [name], DOB Feb 20, 1971, MRN [id number]  PCP:  Laura Jon BROCKS, NP   [facility] Providers Cardiologist:  Jerel Balding, MD     Referring MD: Laura Jon BROCKS, NP   No chief complaint on file.  History of Present Illness:    [name] is a 53 y.o. female with a hx of hypertension, type 2 diabetes mellitus, hypercholesterolemia, PCOS, severe obesity, Sjogren's syndrome and anemia, with mild dilation of the ascending aorta.  She returns in follow-up.  Her most recent CT angiogram showed a slight increase in the diameter of the ascending aorta from 3.9 cm in 2023 to 4.1 cm in September 2024.  The patient specifically denies any chest pain at rest or with exertion, dyspnea at rest or with exertion, orthopnea, paroxysmal nocturnal dyspnea, syncope, palpitations, focal neurological deficits, intermittent claudication, lower extremity edema, unexplained weight gain, cough, hemoptysis or wheezing.  Previous problems with Raynaud's syndrome type digit discoloration have not recently been an issue.  She continues walking 30 minutes a day almost every day.  She has managed to lose about 30 pounds since her appointment a year ago.  Previous CTs show very mild aortic atherosclerosis in the abdominal aorta, but on a coronary CT angiogram in 2023 she did not have any significant stenoses and the calcium score was 0. Her echocardiogram was normal.  Although a pattern of impaired relaxation was described, this was likely due to relative hypovolemia rather than true diastolic dysfunction.  There was no evidence of LVH or left atrial enlargement and mitral annulus E prime velocities were 7 and 10 cm/s respectively with the E/e' ratio of 7-9.  She prefers to have ECGs performed at her primary care provider, because it is much less expensive.  At her last appointment her ECG shows normal sinus rhythm with diffuse nonspecific T wave changes.  The QS  pattern in the anteroseptal leads is no longer present, suggesting that on the previous ECG the ECG leads may have been placed high.  Questionable Q waves are seen in the inferior leads on the current tracing.  Due to the diffusely flattened T waves and some baseline tremor artifact it is very hard to measure her QT interval accurately.  On statin therapy, her most recent lipid profile shows improvement from 2021.  Her total cholesterol 181, LDL 107, HDL 51, triglycerides 144.  Hemoglobin A1c was 6.9% in 2021 and office notes report a more recent value of 6.1%.  Her only current medication for diabetes mellitus is Trulicity.  She has been slowly losing weight since taking this medication.  She has normal renal function but has persistent proteinuria and is under the care of a nephrologist.   Past Medical History:  Diagnosis Date   Allergy    Anemia    Anxiety    Depression    Diabetes (HCC)    Elevated LFTs    Fibromyalgia    GERD (gastroesophageal reflux disease)    Hypercholesteremia    Hypertension    Hypomagnesemia    Morbid obesity (HCC)    Seizures (HCC)    Sjoegren syndrome     Past Surgical History:  Procedure Laterality Date   COLONOSCOPY     ECT TREATMENT     ESOPHAGOGASTRODUODENOSCOPY (EGD) WITH PROPOFOL  N/A 05/01/2023   Procedure: ESOPHAGOGASTRODUODENOSCOPY (EGD) WITH PROPOFOL ;  Surgeon: Albertus Gordy HERO, MD;  Location: [facility] ENDOSCOPY;  Service: Gastroenterology;  Laterality: N/A;   ESSURE  TUBAL LIGATION     TONSILLECTOMY     UPPER GASTROINTESTINAL ENDOSCOPY     WISDOM TOOTH EXTRACTION      Current Medications: Current Meds  Medication Sig   ALPRAZolam (XANAX) 1 MG tablet Take 1 mg by mouth 4 (four) times daily as needed for anxiety.   amLODipine  (NORVASC ) 2.5 MG tablet Take 1 tablet (2.5 mg total) by mouth daily. PLEASE KEEP UPCOMING APPOINTMENT IN ORDER TO RECEIVE FUTURE REFILLS! THANK YOU   aspirin EC 81 MG tablet Take 81 mg by mouth daily. Swallow whole.    atorvastatin (LIPITOR) 80 MG tablet Take 80 mg by mouth at bedtime.   buPROPion  (WELLBUTRIN  XL) 300 MG 24 hr tablet Take 300 mg by mouth daily.   cevimeline  (EVOXAC ) 30 MG capsule Take 30 mg by mouth daily as needed (dry mouth).   colchicine 0.6 MG tablet Take 1.2 mg by mouth daily.   diazepam (VALIUM) 5 MG tablet Take 5 mg by mouth 4 (four) times daily.   DULoxetine  (CYMBALTA ) 60 MG capsule Take 60 mg daily by mouth.   hydroxychloroquine  (PLAQUENIL ) 200 MG tablet Take 200 mg by mouth 2 (two) times daily.   lamoTRIgine  (LAMICTAL ) 200 MG tablet Take 400 mg by mouth daily.   lisdexamfetamine (VYVANSE ) 70 MG capsule Take 1 capsule (70 mg total) by mouth every morning.   losartan  (COZAAR ) 100 MG tablet Take 100 mg by mouth daily.   Lurasidone  HCl (LATUDA ) 60 MG TABS Take 60 mg by mouth daily.   omeprazole  (PRILOSEC) 40 MG capsule Take 1 capsule (40 mg total) by mouth daily.   Plecanatide  (TRULANCE ) 3 MG TABS Take 1 tablet (3 mg total) by mouth daily.   TRULICITY 1.5 MG/0.5ML SOPN Inject 1.5 mg into the skin every Wednesday.   Vitamin D, Ergocalciferol, (DRISDOL) 1.25 MG (50000 UNIT) CAPS capsule Take 50,000 Units by mouth once a week.     Allergies:   Penicillins, Sulfa antibiotics, and Sulfonamide derivatives     Family History: The patient's family history includes Breast cancer in her paternal aunt and paternal aunt; Diabetes in her father and mother; Lupus in her paternal aunt; Multiple sclerosis in her mother; Prostate cancer in her father; Psoriasis in her father; Rheum arthritis in her paternal aunt. There is no history of Colon cancer, Esophageal cancer, Rectal cancer, or Stomach cancer.    EKGs/Labs/Other Studies Reviewed:    The following studies were reviewed today: Coronary CT angiogram 01/30/2022:  1. No evidence of CAD, CADRADS = 0.   2. Coronary calcium score of 0.   3. Normal coronary origins with left dominance.   4.  Borderline dilation of mid ascending aorta, 39  mm.   5. Mild dilation of main pulmonary artery 31 mm, which may suggest increased pulmonary pressure.  Echocardiogram 01/16/2022:   1. Left ventricular ejection fraction, by estimation, is 60 to 65%. The  left ventricle has normal function. The left ventricle has no regional  wall motion abnormalities. Left ventricular diastolic parameters are  consistent with Grade I diastolic  dysfunction (impaired relaxation).   2. Right ventricular systolic function is normal. The right ventricular  size is normal. There is normal pulmonary artery systolic pressure. The  estimated right ventricular systolic pressure is 17.1 mmHg.   3. The mitral valve is normal in structure. Trivial mitral valve  regurgitation. No evidence of mitral stenosis.   4. The aortic valve is normal in structure. Aortic valve regurgitation is  not visualized. No aortic stenosis is present.  5. There is mild dilatation of the ascending aorta, measuring 41 mm.   6. The inferior vena cava is normal in size with greater than 50%  respiratory variability, suggesting right atrial pressure of 3 mmHg.   EKG:  EKG is not ordered today.  The ekg ordered 03/25/2022 demonstrates normal sinus rhythm, questionable Q waves in leads III and aVF, but not in lead II, diffuse nonspecific T wave flattening, hard to measure the QT accurately.  Recent Labs: 05/01/2024: ALT 18; BUN 9; Creatinine, Ser 0.69; Hemoglobin 13.7; Platelets 202; Potassium 4.2; Sodium 137  Recent Lipid Panel No results found for: CHOL, TRIG, HDL, CHOLHDL, VLDL, LDLCALC, LDLDIRECT 09/18/2021 Cholesterol 181, HDL 51, LDL 107, triglycerides 144  Risk Assessment/Calculations:      STOP-Bang Score:          Physical Exam:    VS:  BP 102/70 (BP Location: Left Arm, Patient Position: Sitting, Cuff Size: Normal)   Pulse 67   Ht 5' 7 (1.702 m)   Wt 200 lb (90.7 kg)   SpO2 99%   BMI 31.32 kg/m     Wt Readings from Last 3 Encounters:  05/19/24 200 lb  (90.7 kg)  05/01/24 200 lb (90.7 kg)  03/23/24 207 lb (93.9 kg)      General: Alert, oriented x3, no distress, mildly obese. Head: no evidence of trauma, PERRL, EOMI, no exophtalmos or lid lag, no myxedema, no xanthelasma; normal ears, nose and oropharynx Neck: normal jugular venous pulsations and no hepatojugular reflux; brisk carotid pulses without delay and no carotid bruits Chest: clear to auscultation, no signs of consolidation by percussion or palpation, normal fremitus, symmetrical and full respiratory excursions Cardiovascular: normal position and quality of the apical impulse, regular rhythm, normal first and second heart sounds, no murmurs, rubs or gallops Abdomen: no tenderness or distention, no masses by palpation, no abnormal pulsatility or arterial bruits, normal bowel sounds, no hepatosplenomegaly Extremities: no clubbing, cyanosis or edema; 2+ radial, ulnar and brachial pulses bilaterally; 2+ right femoral, posterior tibial and dorsalis pedis pulses; 2+ left femoral, posterior tibial and dorsalis pedis pulses; no subclavian or femoral bruits Neurological: grossly nonfocal Psych: Normal mood and affect    ASSESSMENT:    1. Aneurysm of ascending aorta without rupture (HCC)   2. Hypercholesterolemia   3. Essential hypertension   4. Raynaud's phenomenon without gangrene   5. History of diabetes mellitus   6. Mild obesity   7. Dilation of pulmonary artery (HCC)    PLAN:    In order of problems listed above:   Asc Ao dilation: Slight increase in diameter compared to 2023 on the study performed 2024.  Will repeat a CT angiogram in September.  By that time she will have had her PCP visit and will have had repeat basic metabolic panel. Hypercholesterolemia: History of diabetes mellitus, resolved with weight loss and some mild aortic atherosclerosis in the abdominal aorta.  She does not have any evidence of true PAD or CAD.  Target LDL less than 100 is acceptable. HTN: Very  well-controlled after weight loss.  We may need to decrease her dose of losartan  if she develops symptoms of hypotension. Raynaud's syndrome: Asymptomatic on a very low-dose of amlodipine .  Also taking hydroxychloroquine  for Sjogren's syndrome which is probably related. DM: She has lost 30 pounds in the last year and about 60 pounds overall.  She no longer has problems with glucose metabolism.  Congratulated.  Remains in the mildly obese range.  Dilated main pulmonary artery:  Incidentally reported on chest CT in 2023, but on the echocardiogram she has normal right heart chamber sizes and no evidence of pulmonary hypertension by TR jet.  Interestingly, the radiologist did not comment on this on the study from September 2024 (on my review it measures exactly the same at 31 mm).  She does not smoke and she does not have typical symptoms of sleep disordered breathing.   Has a history of collagen vascular disease, but no history of other illnesses typically associated with PAH.  She does not have shortness of breath.  At this point invasive evaluation to confirm pulm hypertension does not appear to be justified.           Medication Adjustments/Labs and Tests Ordered: Current medicines are reviewed at length with the patient today.  Concerns regarding medicines are outlined above.  Orders Placed This Encounter  Procedures   CT ANGIO CHEST AORTA W/CM & OR WO/CM   EKG 12-Lead   No orders of the defined types were placed in this encounter.   Patient Instructions  Medication Instructions:  No changes *If you need a refill on your cardiac medications before your next appointment, please call your pharmacy*  Lab Work: None ordered If you have labs (blood work) drawn today and your tests are completely normal, you will receive your results only by: MyChart Message (if you have MyChart) OR A paper copy in the mail If you have any lab test that is abnormal or we need to change your treatment, we will  call you to review the results.  Testing/Procedures: Non-Cardiac CT Angiography (CTA), is a special type of CT scan that uses a computer to produce multi-dimensional views of major blood vessels throughout the body. In CT angiography, a contrast material is injected through an IV to help visualize the blood vessels   Follow-Up: At [facility], you and your health needs are our priority.  As part of our continuing mission to provide you with exceptional heart care, our providers are all part of one team.  This team includes your primary Cardiologist (physician) and Advanced Practice Providers or APPs (Physician Assistants and Nurse Practitioners) who all work together to provide you with the care you need, when you need it.  Your next appointment:   1 year(s)  Provider:   Jerel Balding, MD    We recommend signing up for the patient portal called MyChart.  Sign up information is provided on this After Visit Summary.  MyChart is used to connect with patients for Virtual Visits (Telemedicine).  Patients are able to view lab/test results, encounter notes, upcoming appointments, etc.  Non-urgent messages can be sent to your provider as well.   To learn more about what you can do with MyChart, go to [email or web address].          Signed, Jerel Balding, MD  05/21/2024 1:53 PM    [facility]

## 2024-05-19 NOTE — Patient Instructions (Signed)
 Medication Instructions:  No changes *If you need a refill on your cardiac medications before your next appointment, please call your pharmacy*  Lab Work: None ordered If you have labs (blood work) drawn today and your tests are completely normal, you will receive your results only by: MyChart Message (if you have MyChart) OR A paper copy in the mail If you have any lab test that is abnormal or we need to change your treatment, we will call you to review the results.  Testing/Procedures: Non-Cardiac CT Angiography (CTA), is a special type of CT scan that uses a computer to produce multi-dimensional views of major blood vessels throughout the body. In CT angiography, a contrast material is injected through an IV to help visualize the blood vessels   Follow-Up: At [facility], you and your health needs are our priority.  As part of our continuing mission to provide you with exceptional heart care, our providers are all part of one team.  This team includes your primary Cardiologist (physician) and Advanced Practice Providers or APPs (Physician Assistants and Nurse Practitioners) who all work together to provide you with the care you need, when you need it.  Your next appointment:   1 year(s)  Provider:   Jerel Balding, MD    We recommend signing up for the patient portal called MyChart.  Sign up information is provided on this After Visit Summary.  MyChart is used to connect with patients for Virtual Visits (Telemedicine).  Patients are able to view lab/test results, encounter notes, upcoming appointments, etc.  Non-urgent messages can be sent to your provider as well.   To learn more about what you can do with MyChart, go to [email or web address].

## 2024-05-21 ENCOUNTER — Encounter: Payer: Self-pay | Admitting: Cardiovascular Disease

## 2024-06-07 ENCOUNTER — Encounter: Payer: Self-pay | Admitting: Hematology & Oncology

## 2024-06-14 ENCOUNTER — Encounter: Payer: Self-pay | Admitting: Medical Oncology

## 2024-06-14 ENCOUNTER — Inpatient Hospital Stay: Admitting: Medical Oncology

## 2024-06-14 ENCOUNTER — Inpatient Hospital Stay: Attending: Medical Oncology

## 2024-06-14 ENCOUNTER — Other Ambulatory Visit: Payer: Self-pay

## 2024-06-14 VITALS — BP 105/43 | HR 77 | Temp 98.0°F | Resp 19

## 2024-06-14 DIAGNOSIS — K922 Gastrointestinal hemorrhage, unspecified: Secondary | ICD-10-CM | POA: Diagnosis present

## 2024-06-14 DIAGNOSIS — N920 Excessive and frequent menstruation with regular cycle: Secondary | ICD-10-CM | POA: Diagnosis not present

## 2024-06-14 DIAGNOSIS — D500 Iron deficiency anemia secondary to blood loss (chronic): Secondary | ICD-10-CM

## 2024-06-14 DIAGNOSIS — E639 Nutritional deficiency, unspecified: Secondary | ICD-10-CM

## 2024-06-14 DIAGNOSIS — E559 Vitamin D deficiency, unspecified: Secondary | ICD-10-CM | POA: Insufficient documentation

## 2024-06-14 LAB — CBC WITH DIFFERENTIAL (CANCER CENTER ONLY)
Abs Immature Granulocytes: 0.02 K/uL (ref 0.00–0.07)
Basophils Absolute: 0 K/uL (ref 0.0–0.1)
Basophils Relative: 0 %
Eosinophils Absolute: 0.4 K/uL (ref 0.0–0.5)
Eosinophils Relative: 5 %
HCT: 42.8 % (ref 36.0–46.0)
Hemoglobin: 13.8 g/dL (ref 12.0–15.0)
Immature Granulocytes: 0 %
Lymphocytes Relative: 34 %
Lymphs Abs: 2.7 K/uL (ref 0.7–4.0)
MCH: 29 pg (ref 26.0–34.0)
MCHC: 32.2 g/dL (ref 30.0–36.0)
MCV: 89.9 fL (ref 80.0–100.0)
Monocytes Absolute: 0.8 K/uL (ref 0.1–1.0)
Monocytes Relative: 10 %
Neutro Abs: 4.1 K/uL (ref 1.7–7.7)
Neutrophils Relative %: 51 %
Platelet Count: 241 K/uL (ref 150–400)
RBC: 4.76 MIL/uL (ref 3.87–5.11)
RDW: 13.3 % (ref 11.5–15.5)
WBC Count: 8 K/uL (ref 4.0–10.5)
nRBC: 0 % (ref 0.0–0.2)

## 2024-06-14 LAB — CMP (CANCER CENTER ONLY)
ALT: 24 U/L (ref 0–44)
AST: 25 U/L (ref 15–41)
Albumin: 4.2 g/dL (ref 3.5–5.0)
Alkaline Phosphatase: 64 U/L (ref 38–126)
Anion gap: 10 (ref 5–15)
BUN: 10 mg/dL (ref 6–20)
CO2: 24 mmol/L (ref 22–32)
Calcium: 9.3 mg/dL (ref 8.9–10.3)
Chloride: 103 mmol/L (ref 98–111)
Creatinine: 0.75 mg/dL (ref 0.44–1.00)
GFR, Estimated: >60 mL/min (ref >60)
Glucose, Bld: 113 mg/dL — ABNORMAL HIGH (ref 70–99)
Potassium: 4.6 mmol/L (ref 3.5–5.1)
Sodium: 137 mmol/L (ref 135–145)
Total Bilirubin: 0.4 mg/dL (ref 0.0–1.2)
Total Protein: 7.1 g/dL (ref 6.5–8.1)

## 2024-06-14 LAB — RETIC PANEL
Immature Retic Fract: 3.8 % (ref 2.3–15.9)
RBC.: 4.73 MIL/uL (ref 3.87–5.11)
Retic Count, Absolute: 58.7 K/uL (ref 19.0–186.0)
Retic Ct Pct: 1.2 % (ref 0.4–3.1)
Reticulocyte Hemoglobin: 32.8 pg (ref >27.9)

## 2024-06-14 LAB — IRON AND IRON BINDING CAPACITY (CC-WL,HP ONLY)
Iron: 102 ug/dL (ref 28–170)
Saturation Ratios: 32 % — ABNORMAL HIGH (ref 10.4–31.8)
TIBC: 315 ug/dL (ref 250–450)
UIBC: 213 ug/dL

## 2024-06-14 LAB — VITAMIN B12: Vitamin B-12: 157 pg/mL — ABNORMAL LOW (ref 180–914)

## 2024-06-14 LAB — FERRITIN: Ferritin: 31 ng/mL (ref 11–307)

## 2024-06-14 LAB — VITAMIN D 25 HYDROXY (VIT D DEFICIENCY, FRACTURES): Vit D, 25-Hydroxy: 69.26 ng/mL (ref 30–100)

## 2024-06-14 NOTE — Progress Notes (Signed)
 Hematology and Oncology Follow Up Visit  [name] [id number] [birth year] 53 y.o. 06/14/2024   Principle Diagnosis:  Iron  deficiency anemia - GI loss   Current Therapy: IV iron  as indicated   Work Up:  01/31/15  colonoscopy by Dr. Abran. Findings of a normal terminal ileum and the ascending colon and otherwise normal exam. Pathology revealed a tubular adenoma and patient was told to follow-up in 5 years. At that time patient was experiencing diarrhea.  11/2017  EGD with Dr. Abran for nausea, vomiting, decreased p.o. intake showed mild Schatzki ring, small amount of food in the stomach consistent with documented gastroparesis, gastric polyps biopsied, benign. 06/12/2020 Colonoscopy with [facility] for screening purposes, recall 5 years (06/2025) 02/04/2021 [facility]  with EGD for nausea, vomiting, decreased n.p.o. and mild abdominal discomfort Path showed mild chronic inflammation with focal intestinal metaplasia no evidence of malignancy, fundic gland polyps no evidence of malignancy.  Negative H. Pylori 05/01/2023 Endoscopy with [facility]. Benign polyps without bleeding for melena 11/08/2023 Endoscopy: with [facility]. Mild hyperplastic changes    Interim History:  [name] is here today for follow-up.   Today she states that she has been doing ok. No recent health changes.  She had an Endoscopy on 11/08/2023 which went well.  Perimenopausal- [facility] 6 months ago. No chance for pregnancy.  There has been no bleeding to her knowledge: denies epistaxis, gingivitis, hemoptysis, hematemesis, hematuria, melena, excessive bruising, blood donation.  Not taking oral iron  due to severe constipation with oral iron  No abnormal bruising, no petechiae.  Fever, chills, n/v, cough, rash, dizziness, SOB, chest pain, palpitations, abdominal pain or changes in bowel or bladder habits.  No swelling, tenderness, numbness or tingling in her extremities.  No falls or syncope.  She is on Trulicity and has gastroparesis so  she has lost a lot of weight. She is using proteins powder   Wt Readings from Last 3 Encounters:  05/19/24 200 lb (90.7 kg)  05/01/24 200 lb (90.7 kg)  03/23/24 207 lb (93.9 kg)   ECOG Performance Status: 1 - Symptomatic but completely ambulatory  Medications:  Allergies as of 06/14/2024       Reactions   Penicillins Hives   Sulfa Antibiotics Hives   Sulfonamide Derivatives Hives        Medication List        Accurate as of June 14, 2024  3:06 PM. If you have any questions, ask your nurse or doctor.          ALPRAZolam 1 MG tablet Commonly known as: XANAX Take 1 mg by mouth 4 (four) times daily as needed for anxiety.   amLODipine  2.5 MG tablet Commonly known as: NORVASC  Take 1 tablet (2.5 mg total) by mouth daily. PLEASE KEEP UPCOMING APPOINTMENT IN ORDER TO RECEIVE FUTURE REFILLS! THANK YOU   aspirin EC 81 MG tablet Take 81 mg by mouth daily. Swallow whole.   atorvastatin 80 MG tablet Commonly known as: LIPITOR Take 80 mg by mouth at bedtime.   buPROPion  300 MG 24 hr tablet Commonly known as: WELLBUTRIN  XL Take 300 mg by mouth daily.   cevimeline  30 MG capsule Commonly known as: EVOXAC  Take 30 mg by mouth daily as needed (dry mouth).   colchicine 0.6 MG tablet Take 1.2 mg by mouth daily.   diazepam 5 MG tablet Commonly known as: VALIUM Take 5 mg by mouth 4 (four) times daily.   DULoxetine  60 MG capsule Commonly known as: CYMBALTA  Take 60 mg daily by mouth.  hydroxychloroquine  200 MG tablet Commonly known as: PLAQUENIL  Take 200 mg by mouth 2 (two) times daily.   lamoTRIgine  200 MG tablet Commonly known as: LAMICTAL  Take 400 mg by mouth daily.   Latuda  60 MG Tabs Generic drug: Lurasidone  HCl Take 60 mg by mouth daily.   lisdexamfetamine 70 MG capsule Commonly known as: Vyvanse  Take 1 capsule (70 mg total) by mouth every morning.   losartan  100 MG tablet Commonly known as: COZAAR  Take 100 mg by mouth daily.   mupirocin ointment 2  % Commonly known as: BACTROBAN Apply 1 Application topically as needed.   omeprazole  40 MG capsule Commonly known as: PRILOSEC Take 1 capsule (40 mg total) by mouth daily.   ondansetron  8 MG tablet Commonly known as: ZOFRAN  Take 1 tablet (8 mg total) by mouth every 8 (eight) hours as needed for nausea or vomiting.   oxyCODONE -acetaminophen  5-325 MG tablet Commonly known as: PERCOCET/ROXICET Take 1 tablet by mouth every 6 (six) hours as needed for severe pain (pain score 7-10).   senna-docusate 8.6-50 MG tablet Commonly known as: Senokot-S Take 1 tablet by mouth at bedtime as needed for mild constipation.   Trulance  3 MG Tabs Generic drug: Plecanatide  Take 1 tablet (3 mg total) by mouth daily.   Trulicity 1.5 MG/0.5ML Soaj Generic drug: Dulaglutide Inject 1.5 mg into the skin every Wednesday.   Vitamin D  (Ergocalciferol ) 1.25 MG (50000 UNIT) Caps capsule Commonly known as: DRISDOL Take 50,000 Units by mouth once a week.        Allergies:  Allergies  Allergen Reactions   Penicillins Hives   Sulfa Antibiotics Hives   Sulfonamide Derivatives Hives    Past Medical History, Surgical history, Social history, and Family History were reviewed and updated.  Review of Systems: All other 10 point review of systems is negative.   Physical Exam:  oral temperature is 98 F (36.7 C). Her blood pressure is 105/43 (abnormal) and her pulse is 77. Her respiration is 19 and oxygen saturation is 99%.   Wt Readings from Last 3 Encounters:  05/19/24 200 lb (90.7 kg)  05/01/24 200 lb (90.7 kg)  03/23/24 207 lb (93.9 kg)    Ocular: Sclerae unicteric, pupils equal, round and reactive to light Ear-nose-throat: Oropharynx clear, dentition fair Lymphatic: No cervical or supraclavicular adenopathy Lungs no rales or rhonchi, good excursion bilaterally Heart regular rate and rhythm, no murmur appreciated Abd soft, nontender, positive bowel sounds MSK no focal spinal tenderness, no  joint edema Neuro: non-focal, well-oriented, appropriate affect  Lab Results  Component Value Date   WBC 8.0 06/14/2024   HGB 13.8 06/14/2024   HCT 42.8 06/14/2024   MCV 89.9 06/14/2024   PLT 241 06/14/2024   Lab Results  Component Value Date   FERRITIN 58 03/14/2024   IRON  73 03/14/2024   TIBC 288 03/14/2024   UIBC 215 03/14/2024   IRONPCTSAT 25 03/14/2024   Lab Results  Component Value Date   RETICCTPCT 1.2 06/14/2024   RBC 4.73 06/14/2024   RBC 4.76 06/14/2024   No results found for: JONATHAN BONG [facility] Lab Results  Component Value Date   IGGSERUM 1,134 05/19/2021   IGA 154 05/19/2021   IGMSERUM 124 05/19/2021   No results found for: TOTALPROTELP, ALBUMINELP, A1GS, A2GS, BETS, BETA2SER, GAMS, MSPIKE, SPEI   Chemistry      Component Value Date/Time   NA 137 05/01/2024 1800   NA 138 01/09/2022 1524   K 4.2 05/01/2024 1800   CL 101 05/01/2024 1800  CO2 25 05/01/2024 1800   BUN 9 05/01/2024 1800   BUN 9 01/09/2022 1524   CREATININE 0.69 05/01/2024 1800   CREATININE 0.81 03/14/2024 1444      Component Value Date/Time   CALCIUM 9.1 05/01/2024 1800   ALKPHOS 65 05/01/2024 1800   AST 25 05/01/2024 1800   AST 20 03/14/2024 1444   ALT 18 05/01/2024 1800   ALT 15 03/14/2024 1444   BILITOT 0.5 05/01/2024 1800   BILITOT 0.4 03/14/2024 1444     Encounter Diagnoses  Name Primary?   Iron deficiency anemia due to chronic blood loss Yes   Nutritional deficiency    Impression and Plan: [name] is a very pleasant 53 yo caucasian female with history iron deficiency anemia secondary to GI bleed as well as several autoimmune issues including Sjogren's, fibromyalgia and type II diabetes. See GI work up above.   Today CBC shows a 13.8 which is stable No evidence of known bleeding.  Iron studies pending.    RTC 6 months APP, labs ( CBC w/, CMP, ferritin, iron, retic)-[place]   Laura CHRISTELLA Dais, PA-C 8/13/20253:06 PM

## 2024-06-15 ENCOUNTER — Telehealth: Payer: Self-pay | Admitting: Cardiovascular Disease

## 2024-06-15 ENCOUNTER — Ambulatory Visit: Payer: Self-pay | Admitting: Medical Oncology

## 2024-06-15 NOTE — Telephone Encounter (Signed)
 Drug Drug interactions noted:  Contraindicated: Concurrent use of ATORVASTATIN and COLCHICINE may result in increased colchicine exposure, an increased risk of colchicine toxicity and an increased risk of myopathy and rhabdomyolysis.  Other interactions: Concurrent use of DEXTROAMPHETAMINE OR LISDEXAMFETAMINE (Vyvanse ) and HYDROXYCHLOROQUINE  may result in an increased risk of seizures, increased dextroamphetamine or lisdexamfetamine (prodrug of dextroamphetamine) exposure and an increased risk of serotonin syndrome.   Recommend she discuss with provider who prescribed above medications.

## 2024-06-15 NOTE — Telephone Encounter (Signed)
 Pt c/o medication issue:  1. Name of Medication:  Vyananse 70mg  once daily  Prednisone  pack   2. How are you currently taking this medication (dosage and times per day)? As written   3. Are you having a reaction (difficulty breathing--STAT)? No   4. What is your medication issue? Pt called in asking if she take these medications with her cardiac medications.

## 2024-06-15 NOTE — Telephone Encounter (Signed)
 Spoke with pt who was only concerned about taking prednisone  and it causing high blood pressure since she has a HX of Mild aortic and pulmonary artery dilatation.  Advised while prednisone  can cause elevation of blood pressure typically it is used only for a short amount of time.  Advised ok to use as instructions and monitor blood pressure however long term untreated elevated blood pressures are usually more of a concern with any arterial dilatation. Reviewed other comments from pharmacist with her.  She states understanding and will call back if any further questions or concerns.

## 2024-06-21 ENCOUNTER — Encounter: Payer: Self-pay | Admitting: Cardiovascular Disease

## 2024-07-18 ENCOUNTER — Encounter: Payer: Self-pay | Admitting: Cardiovascular Disease

## 2024-07-18 DIAGNOSIS — E7800 Pure hypercholesterolemia, unspecified: Secondary | ICD-10-CM

## 2024-07-18 MED ORDER — EZETIMIBE 10 MG PO TABS: 10.0000 mg | ORAL_TABLET | Freq: Every day | ORAL | 3 refills | Status: AC

## 2024-07-18 NOTE — Telephone Encounter (Signed)
 Please start ezetimibe  10 mg daily and check lipid profile and LP(a) in 2-3 months.  Thanks

## 2024-08-04 ENCOUNTER — Ambulatory Visit (HOSPITAL_COMMUNITY)
Admission: RE | Admit: 2024-08-04 | Discharge: 2024-08-04 | Disposition: A | Discharge status: Home / Self Care | Source: Ambulatory Visit | Attending: Cardiovascular Disease | Admitting: Cardiovascular Disease

## 2024-08-04 DIAGNOSIS — I7121 Aneurysm of the ascending aorta, without rupture: Secondary | ICD-10-CM | POA: Diagnosis present

## 2024-08-04 MED ORDER — IOHEXOL 350 MG/ML SOLN
75.0000 mL | Freq: Once | INTRAVENOUS | Status: AC | PRN
Start: 2024-08-04 — End: 2024-08-04
  Administered 2024-08-04: 75 mL via INTRAVENOUS

## 2024-08-07 ENCOUNTER — Ambulatory Visit
Admission: RE | Admit: 2024-08-07 | Discharge: 2024-08-07 | Disposition: A | Discharge status: Home / Self Care | Source: Ambulatory Visit | Attending: Obstetrics and Gynecology | Admitting: Obstetrics and Gynecology

## 2024-08-07 ENCOUNTER — Ambulatory Visit
Admission: RE | Admit: 2024-08-07 | Discharge: 2024-08-07 | Disposition: A | Source: Ambulatory Visit | Attending: Obstetrics and Gynecology | Admitting: Obstetrics and Gynecology

## 2024-08-07 DIAGNOSIS — N6310 Unspecified lump in the right breast, unspecified quadrant: Secondary | ICD-10-CM

## 2024-08-10 ENCOUNTER — Ambulatory Visit: Payer: Self-pay | Admitting: Cardiovascular Disease

## 2024-10-10 ENCOUNTER — Inpatient Hospital Stay: Admitting: Medical Oncology

## 2024-10-10 ENCOUNTER — Inpatient Hospital Stay: Attending: Medical Oncology

## 2024-10-10 ENCOUNTER — Encounter: Payer: Self-pay | Admitting: Medical Oncology

## 2024-10-10 ENCOUNTER — Other Ambulatory Visit: Payer: Self-pay | Admitting: Cardiovascular Disease

## 2024-10-10 VITALS — BP 117/45 | HR 59 | Temp 98.3°F | Resp 18 | Ht 67.0 in | Wt 201.4 lb

## 2024-10-10 DIAGNOSIS — D500 Iron deficiency anemia secondary to blood loss (chronic): Secondary | ICD-10-CM | POA: Insufficient documentation

## 2024-10-10 DIAGNOSIS — E639 Nutritional deficiency, unspecified: Secondary | ICD-10-CM | POA: Diagnosis not present

## 2024-10-10 DIAGNOSIS — K222 Esophageal obstruction: Secondary | ICD-10-CM | POA: Diagnosis not present

## 2024-10-10 DIAGNOSIS — K922 Gastrointestinal hemorrhage, unspecified: Secondary | ICD-10-CM | POA: Diagnosis present

## 2024-10-10 LAB — CMP (CANCER CENTER ONLY)
ALT: 27 U/L (ref 0–44)
AST: 31 U/L (ref 15–41)
Albumin: 4.2 g/dL (ref 3.5–5.0)
Alkaline Phosphatase: 63 U/L (ref 38–126)
Anion gap: 9 (ref 5–15)
BUN: 12 mg/dL (ref 6–20)
CO2: 27 mmol/L (ref 22–32)
Calcium: 9.4 mg/dL (ref 8.9–10.3)
Chloride: 103 mmol/L (ref 98–111)
Creatinine: 0.7 mg/dL (ref 0.44–1.00)
GFR, Estimated: >60 mL/min (ref >60)
Glucose, Bld: 106 mg/dL — ABNORMAL HIGH (ref 70–99)
Potassium: 3.9 mmol/L (ref 3.5–5.1)
Sodium: 139 mmol/L (ref 135–145)
Total Bilirubin: 0.5 mg/dL (ref 0.0–1.2)
Total Protein: 7.1 g/dL (ref 6.5–8.1)

## 2024-10-10 LAB — CBC
HCT: 43.3 % (ref 36.0–46.0)
Hemoglobin: 14.2 g/dL (ref 12.0–15.0)
MCH: 29.9 pg (ref 26.0–34.0)
MCHC: 32.8 g/dL (ref 30.0–36.0)
MCV: 91.2 fL (ref 80.0–100.0)
Platelets: 228 K/uL (ref 150–400)
RBC: 4.75 MIL/uL (ref 3.87–5.11)
RDW: 12.6 % (ref 11.5–15.5)
WBC: 9.2 K/uL (ref 4.0–10.5)
nRBC: 0 % (ref 0.0–0.2)

## 2024-10-10 LAB — IRON AND IRON BINDING CAPACITY (CC-WL,HP ONLY)
Iron: 95 ug/dL (ref 28–170)
Saturation Ratios: 27 % (ref 10.4–31.8)
TIBC: 351 ug/dL (ref 250–450)
UIBC: 256 ug/dL

## 2024-10-10 LAB — FERRITIN: Ferritin: 40 ng/mL (ref 11–307)

## 2024-10-10 NOTE — Progress Notes (Addendum)
 Hematology and Oncology Follow Up Visit  Iantha Titsworth [id number] 11-Dec-1970 53 y.o. 10/10/2024   Principle Diagnosis:  Iron  deficiency anemia - GI loss   Current Therapy: IV iron  as indicated   Work Up:  01/31/15  colonoscopy by Dr. Abran. Findings of a normal terminal ileum and the ascending colon and otherwise normal exam. Pathology revealed a tubular adenoma and patient was told to follow-up in 5 years. At that time patient was experiencing diarrhea.  11/2017  EGD with Dr. Abran for nausea, vomiting, decreased p.o. intake showed mild Schatzki ring, small amount of food in the stomach consistent with documented gastroparesis, gastric polyps biopsied, benign. 06/12/2020 Colonoscopy with [facility] for screening purposes, recall 5 years (06/2025) 02/04/2021 [facility]  with EGD for nausea, vomiting, decreased n.p.o. and mild abdominal discomfort Path showed mild chronic inflammation with focal intestinal metaplasia no evidence of malignancy, fundic gland polyps no evidence of malignancy.  Negative H. Pylori 05/01/2023 Endoscopy with [facility]. Benign polyps without bleeding for melena 11/08/2023 Endoscopy: with [facility]. Mild hyperplastic changes    Interim History:  Ms. Fulp is here today for follow-up.   She reports being ok- has fatigue.  Perimenopausal- [facility]  about 2 months ago and very light. No chance for pregnancy.  There has been no bleeding to her knowledge: denies epistaxis, gingivitis, hemoptysis, hematemesis, hematuria, melena, excessive bruising, blood donation.  Not taking oral iron  due to severe constipation with oral iron  No abnormal bruising, no petechiae.  Fever, chills, n/v, cough, rash, dizziness, SOB, chest pain, palpitations, abdominal pain or changes in bowel or bladder habits.  No swelling, tenderness, numbness or tingling in her extremities.  No falls or syncope.  She is on Trulicity and has gastroparesis so she has lost a lot of weight. She is using proteins powder   Wt  Readings from Last 3 Encounters:  10/10/24 201 lb 6.4 oz (91.4 kg)  05/19/24 200 lb (90.7 kg)  05/01/24 200 lb (90.7 kg)   ECOG Performance Status: 1 - Symptomatic but completely ambulatory  Medications:  Allergies as of 10/10/2024       Reactions   Penicillins Hives   Sulfa Antibiotics Hives   Sulfonamide Derivatives Hives        Medication List        Accurate as of October 10, 2024  2:55 PM. If you have any questions, ask your nurse or doctor.          STOP taking these medications    mupirocin ointment 2 % Commonly known as: BACTROBAN Stopped by: Lauraine CHRISTELLA Dais   senna-docusate 8.6-50 MG tablet Commonly known as: Senokot-S Stopped by: Lauraine CHRISTELLA Dais       TAKE these medications    ALPRAZolam 1 MG tablet Commonly known as: XANAX Take 1 mg by mouth 4 (four) times daily as needed for anxiety.   amLODipine  2.5 MG tablet Commonly known as: NORVASC  Take 1 tablet (2.5 mg total) by mouth daily. PLEASE KEEP UPCOMING APPOINTMENT IN ORDER TO RECEIVE FUTURE REFILLS! THANK YOU   aspirin EC 81 MG tablet Take 81 mg by mouth daily. Swallow whole.   atorvastatin 80 MG tablet Commonly known as: LIPITOR Take 80 mg by mouth at bedtime.   buPROPion  300 MG 24 hr tablet Commonly known as: WELLBUTRIN  XL Take 300 mg by mouth daily.   cevimeline  30 MG capsule Commonly known as: EVOXAC  Take 30 mg by mouth daily as needed (dry mouth).   colchicine 0.6 MG tablet Take 1.2 mg by  mouth daily.   diazepam 5 MG tablet Commonly known as: VALIUM Take 5 mg by mouth 4 (four) times daily.   DULoxetine  60 MG capsule Commonly known as: CYMBALTA  Take 60 mg daily by mouth.   ezetimibe  10 MG tablet Commonly known as: ZETIA  Take 1 tablet (10 mg total) by mouth daily.   hydroxychloroquine  200 MG tablet Commonly known as: PLAQUENIL  Take 200 mg by mouth 2 (two) times daily.   lamoTRIgine  200 MG tablet Commonly known as: LAMICTAL  Take 400 mg by mouth daily.   Latuda  60  MG Tabs Generic drug: Lurasidone  HCl Take 60 mg by mouth daily.   lisdexamfetamine 70 MG capsule Commonly known as: Vyvanse  Take 1 capsule (70 mg total) by mouth every morning.   losartan  100 MG tablet Commonly known as: COZAAR  Take 100 mg by mouth daily.   omeprazole  40 MG capsule Commonly known as: PRILOSEC Take 1 capsule (40 mg total) by mouth daily.   ondansetron  8 MG tablet Commonly known as: ZOFRAN  Take 1 tablet (8 mg total) by mouth every 8 (eight) hours as needed for nausea or vomiting.   oxyCODONE -acetaminophen  5-325 MG tablet Commonly known as: PERCOCET/ROXICET Take 1 tablet by mouth every 6 (six) hours as needed for severe pain (pain score 7-10).   Trulance  3 MG Tabs Generic drug: Plecanatide  Take 1 tablet (3 mg total) by mouth daily.   Trulicity 1.5 MG/0.5ML Soaj Generic drug: Dulaglutide Inject 1.5 mg into the skin every Wednesday.   Vitamin D  (Ergocalciferol ) 1.25 MG (50000 UNIT) Caps capsule Commonly known as: DRISDOL Take 50,000 Units by mouth once a week.        Allergies:  Allergies  Allergen Reactions   Penicillins Hives   Sulfa Antibiotics Hives   Sulfonamide Derivatives Hives    Past Medical History, Surgical history, Social history, and Family History were reviewed and updated.  Review of Systems: All other 10 point review of systems is negative.   Physical Exam:  height is 5' 7 (1.702 m) and weight is 201 lb 6.4 oz (91.4 kg). Her oral temperature is 98.3 F (36.8 C). Her blood pressure is 117/45 (abnormal) and her pulse is 59 (abnormal). Her respiration is 18 and oxygen saturation is 100%.   Wt Readings from Last 3 Encounters:  10/10/24 201 lb 6.4 oz (91.4 kg)  05/19/24 200 lb (90.7 kg)  05/01/24 200 lb (90.7 kg)    Ocular: Sclerae unicteric, pupils equal, round and reactive to light Ear-nose-throat: Oropharynx clear, dentition fair Lymphatic: No cervical or supraclavicular adenopathy Lungs no rales or rhonchi, good excursion  bilaterally Heart regular rate and rhythm, no murmur appreciated Abd soft, nontender, positive bowel sounds MSK no focal spinal tenderness, no joint edema Neuro: non-focal, well-oriented, appropriate affect  Lab Results  Component Value Date   WBC 9.2 10/10/2024   HGB 14.2 10/10/2024   HCT 43.3 10/10/2024   MCV 91.2 10/10/2024   PLT 228 10/10/2024   Lab Results  Component Value Date   FERRITIN 31 06/14/2024   IRON  102 06/14/2024   TIBC 315 06/14/2024   UIBC 213 06/14/2024   IRONPCTSAT 32 (H) 06/14/2024   Lab Results  Component Value Date   RETICCTPCT 1.2 06/14/2024   RBC 4.75 10/10/2024   No results found for: JONATHAN BONG, [facility] Lab Results  Component Value Date   IGGSERUM 1,134 05/19/2021   IGA 154 05/19/2021   IGMSERUM 124 05/19/2021   No results found for: TOTALPROTELP, ALBUMINELP, A1GS, A2GS, BETS, BETA2SER, GAMS, MSPIKE, SPEI  Chemistry      Component Value Date/Time   NA 137 06/14/2024 1425   NA 138 01/09/2022 1524   K 4.6 06/14/2024 1425   CL 103 06/14/2024 1425   CO2 24 06/14/2024 1425   BUN 10 06/14/2024 1425   BUN 9 01/09/2022 1524   CREATININE 0.75 06/14/2024 1425      Component Value Date/Time   CALCIUM 9.3 06/14/2024 1425   ALKPHOS 64 06/14/2024 1425   AST 25 06/14/2024 1425   ALT 24 06/14/2024 1425   BILITOT 0.4 06/14/2024 1425     Encounter Diagnoses  Name Primary?   Iron  deficiency anemia due to chronic blood loss Yes   Nutritional deficiency     Impression and Plan: Ms. Hodges is a very pleasant 53 yo caucasian female with history iron  deficiency anemia secondary to GI bleed as well as several autoimmune issues including Sjogren's, fibromyalgia and type II diabetes. See GI work up above.   Today CBC shows a 14.2 which is stable to improved  She is starting B12 as her last value was low- 2000 mcg once daily. PCP to recheck this value in 1-2 months with serum B12 goal of [phone]. May need  injections if oral supplementation is ineffective.  Also discussed her BP/pulse- may also be causing some fatigue. She will also review this with PCP No evidence of known bleeding.  Iron  studies pending.    RTC 6 months APP, labs ( CBC w/, CMP, ferritin, iron , retic, B12)-[place]   Lauraine CHRISTELLA Dais, PA-C 12/9/20252:55 PM

## 2024-10-16 ENCOUNTER — Ambulatory Visit: Payer: Self-pay | Admitting: Medical Oncology

## 2024-10-16 ENCOUNTER — Other Ambulatory Visit: Payer: Self-pay | Admitting: Medical Oncology

## 2024-10-17 ENCOUNTER — Telehealth: Payer: Self-pay | Admitting: Medical Oncology

## 2024-10-17 NOTE — Telephone Encounter (Signed)
 Called to schedule infusion per inbasket. LVM to return call for scheduling.

## 2024-10-20 ENCOUNTER — Inpatient Hospital Stay

## 2024-10-20 VITALS — BP 107/68 | HR 68 | Temp 97.8°F | Resp 17

## 2024-10-20 DIAGNOSIS — D509 Iron deficiency anemia, unspecified: Secondary | ICD-10-CM

## 2024-10-20 DIAGNOSIS — D500 Iron deficiency anemia secondary to blood loss (chronic): Secondary | ICD-10-CM | POA: Diagnosis not present

## 2024-10-20 MED ORDER — SODIUM CHLORIDE 0.9 % IV SOLN: INTRAVENOUS | Status: DC

## 2024-10-20 MED ORDER — IRON SUCROSE 300 MG IVPB - SIMPLE MED
300.0000 mg | Freq: Once | Status: AC
  Administered 2024-10-20: 300 mg via INTRAVENOUS
  Filled 2024-10-20: qty 300

## 2024-10-20 NOTE — Patient Instructions (Signed)

## 2024-11-07 ENCOUNTER — Encounter: Payer: Self-pay | Admitting: Cardiovascular Disease

## 2024-11-14 ENCOUNTER — Telehealth: Payer: Self-pay | Admitting: Cardiovascular Disease

## 2024-11-14 NOTE — Telephone Encounter (Signed)
 Pt c/o BP issue: STAT if pt c/o blurred vision, one-sided weakness or slurred speech.   1. What is your BP concern?  BP has been higher than usual--systolic is in the 130's. Highest was 139  2. Have you taken any BP medication today? No   3. What are your last 5 BP readings? 135/75 132/79 128/76 130/75 134/76  4. Are you having any other symptoms (ex. Dizziness, headache, blurred vision, passed out)?  No

## 2024-11-14 NOTE — Telephone Encounter (Signed)
 BP is OK - he can continue the Vyvanse . I can see his BP from some of his office visits at [facility]. High BP has not been a problem.

## 2024-11-14 NOTE — Telephone Encounter (Signed)
 Patient is returning call. Please advise?

## 2024-11-14 NOTE — Telephone Encounter (Signed)
 Left message to call back.

## 2024-11-15 NOTE — Telephone Encounter (Signed)
 Left message to call back.

## 2024-12-05 ENCOUNTER — Encounter: Payer: Self-pay | Admitting: Hematology & Oncology

## 2024-12-14 ENCOUNTER — Inpatient Hospital Stay: Payer: Self-pay

## 2024-12-14 ENCOUNTER — Ambulatory Visit: Admitting: Medical Oncology

## 2025-04-10 ENCOUNTER — Inpatient Hospital Stay: Admitting: Medical Oncology

## 2025-04-10 ENCOUNTER — Inpatient Hospital Stay: Payer: Self-pay
